# Patient Record
Sex: Female | Born: 1990 | Race: Black or African American | Hispanic: No | Marital: Single | Smoking: Current every day smoker
Health system: Southern US, Community
[De-identification: ages and names within clinical notes are randomized; demographics above are authoritative.]

## PROBLEM LIST (undated history)

## (undated) ENCOUNTER — Inpatient Hospital Stay (HOSPITAL_COMMUNITY): Payer: Self-pay

## (undated) DIAGNOSIS — D649 Anemia, unspecified: Secondary | ICD-10-CM

## (undated) DIAGNOSIS — N946 Dysmenorrhea, unspecified: Secondary | ICD-10-CM

## (undated) DIAGNOSIS — K852 Alcohol induced acute pancreatitis without necrosis or infection: Secondary | ICD-10-CM

## (undated) DIAGNOSIS — Z789 Other specified health status: Secondary | ICD-10-CM

## (undated) HISTORY — PX: NO PAST SURGERIES: SHX2092

---
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
## 2012-04-30 ENCOUNTER — Emergency Department (HOSPITAL_COMMUNITY): Payer: BC Managed Care – PPO

## 2012-04-30 ENCOUNTER — Encounter (HOSPITAL_COMMUNITY): Payer: Self-pay | Admitting: Emergency Medicine

## 2012-04-30 ENCOUNTER — Emergency Department (HOSPITAL_COMMUNITY)
Admission: EM | Admit: 2012-04-30 | Discharge: 2012-04-30 | Disposition: A | Discharge status: Home / Self Care | Payer: BC Managed Care – PPO | Attending: Emergency Medicine | Admitting: Emergency Medicine

## 2012-04-30 DIAGNOSIS — J069 Acute upper respiratory infection, unspecified: Secondary | ICD-10-CM | POA: Insufficient documentation

## 2012-04-30 MED ORDER — IBUPROFEN 800 MG PO TABS: 800.0000 mg | ORAL_TABLET | Freq: Three times a day (TID) | ORAL | Status: AC

## 2012-04-30 MED ORDER — HYDROCOD POLST-CHLORPHEN POLST 10-8 MG/5ML PO LQCR: 5.0000 mL | Freq: Two times a day (BID) | ORAL | Status: DC | PRN

## 2012-04-30 MED ORDER — ONDANSETRON 8 MG PO TBDP: 8.0000 mg | ORAL_TABLET | Freq: Three times a day (TID) | ORAL | Status: AC | PRN

## 2012-04-30 MED ORDER — ONDANSETRON 8 MG PO TBDP
8.0000 mg | ORAL_TABLET | Freq: Once | ORAL | Status: AC
  Administered 2012-04-30: 8 mg via ORAL
  Filled 2012-04-30: qty 1

## 2012-04-30 NOTE — ED Notes (Signed)
Pt presented to the ER with c/o URI, states her "nose is stuffed up and it is hard to breath" pt also states " I am afraid to go to sleep since I cannot breath"

## 2012-04-30 NOTE — ED Provider Notes (Signed)
History     CSN: [id number]  Arrival date & time 04/30/12  1622   First MD Initiated Contact with Patient 04/30/12 1646     5:03 PM HPI She reports yesterday began having a sore, cough, body aches, and nausea. Uncertain whether she is a fever but will get hot and cold very fast. Denies abdominal pain, vomiting, diarrhea, chest pain shortness of breath, or back pain   Patient is a 21 y.o. female presenting with URI. The history is provided by the patient.  URI The primary symptoms include fever, sore throat, cough, nausea and myalgias. Primary symptoms do not include headaches, ear pain, swollen glands, wheezing, abdominal pain, vomiting or rash. The current episode started yesterday. This is a new problem. The problem has been gradually worsening.  Symptoms associated with the illness include chills, congestion and rhinorrhea. The illness is not associated with sinus pressure.    History reviewed. No pertinent past medical history.  History reviewed. No pertinent past surgical history.  No family history on file.  History  Substance Use Topics  . Smoking status: Not on file  . Smokeless tobacco: Not on file  . Alcohol Use: Not on file    OB History    Grav Para Term Preterm Abortions TAB SAB Ect Mult Living                  Review of Systems  Constitutional: Positive for fever and chills.  HENT: Positive for congestion, sore throat and rhinorrhea. Negative for ear pain, facial swelling, neck pain, neck stiffness, postnasal drip and sinus pressure.   Respiratory: Positive for cough. Negative for shortness of breath and wheezing.   Cardiovascular: Negative for chest pain.  Gastrointestinal: Positive for nausea. Negative for vomiting, abdominal pain and diarrhea.  Genitourinary: Negative for dysuria, urgency, frequency, hematuria and flank pain.  Musculoskeletal: Positive for myalgias.  Skin: Negative for rash.  Neurological: Negative for headaches.  All other systems  reviewed and are negative.    Allergies  Review of patient's allergies indicates no known allergies.  Home Medications   Current Outpatient Rx  Name Route Sig Dispense Refill  . CETIRIZINE HCL 10 MG PO TABS Oral Take 10 mg by mouth daily.    . DAYQUIL PO Oral Take 1 capsule by mouth daily as needed. COLD AND COUGH      BP 105/75  Pulse 123  Temp(Src) 99.7 F (37.6 C) (Oral)  Resp 20  Wt 120 lb (54.432 kg)  SpO2 99%  Physical Exam  Vitals reviewed. Constitutional: She is oriented to person, place, and time. Vital signs are normal. She appears well-developed and well-nourished. No distress.  HENT:  Head: Normocephalic and atraumatic.  Right Ear: Hearing, tympanic membrane, external ear and ear canal normal.  Left Ear: Hearing, tympanic membrane, external ear and ear canal normal.  Nose: Nose normal. No mucosal edema or rhinorrhea. Right sinus exhibits no maxillary sinus tenderness and no frontal sinus tenderness. Left sinus exhibits no maxillary sinus tenderness and no frontal sinus tenderness.  Mouth/Throat: Oropharynx is clear and moist. No oropharyngeal exudate.  Eyes: Conjunctivae are normal. Pupils are equal, round, and reactive to light.  Neck: Trachea normal, normal range of motion and full passive range of motion without pain. Neck supple. No spinous process tenderness and no muscular tenderness present. No rigidity. No Brudzinski's sign and no Kernig's sign noted.  Cardiovascular: Normal rate, regular rhythm and normal heart sounds.  Exam reveals no friction rub.   No murmur heard.  Pulmonary/Chest: Effort normal. She has wheezes (RUL ). She has no rhonchi. She has no rales. She exhibits no tenderness.  Abdominal: Soft. Bowel sounds are normal. She exhibits no distension. There is no tenderness.  Musculoskeletal: Normal range of motion.  Lymphadenopathy:    She has no cervical adenopathy.  Neurological: She is alert and oriented to person, place, and time. Coordination  normal.  Skin: Skin is warm and dry. No rash noted. No erythema. No pallor.    ED Course  Procedures Dg Chest 2 View  04/30/2012  *RADIOLOGY REPORT*  Clinical Data: Chest pain and cough.  CHEST - 2 VIEW  Comparison: None.  Findings: Lungs are clear.  Heart size normal.  No pneumothorax or effusion.  IMPRESSION: Negative chest.  Original Report Authenticated By: Bernadene Bell. D'ALESSIO, M.D.      MDM    Patient likely has a viral infection. Will treat cough with antitussive and anti-inflammatory medication for sore throat. Also get medication for nausea. Advised ibuprofen and Tylenol for body aches and chills. Patient voices understanding and is ready for discharge      Thomasene Lot, Cordelia Poche 04/30/12 1743

## 2012-04-30 NOTE — ED Notes (Signed)
Pt. With sore throat, cough, body aches and chills.  Symptoms started yesterday morning.  Nausea but no vomiting or diarrhea.

## 2012-04-30 NOTE — Discharge Instructions (Signed)
Antibiotic Nonuse  Your caregiver felt that the infection or problem was not one that would be helped with an antibiotic. Infections may be caused by viruses or bacteria. Only a caregiver can tell which one of these is the likely cause of an illness. A cold is the most common cause of infection in both adults and children. A cold is a virus. Antibiotic treatment will have no effect on a viral infection. Viruses can lead to many lost days of work caring for sick children and many missed days of school. Children may catch as many as 10 "colds" or "flus" per year during which they can be tearful, cranky, and uncomfortable. The goal of treating a virus is aimed at keeping the ill person comfortable. Antibiotics are medications used to help the body fight bacterial infections. There are relatively few types of bacteria that cause infections but there are hundreds of viruses. While both viruses and bacteria cause infection they are very different types of germs. A viral infection will typically go away by itself within 7 to 10 days. Bacterial infections may spread or get worse without antibiotic treatment. Examples of bacterial infections are:  Sore throats (like strep throat or tonsillitis).   Infection in the lung (pneumonia).   Ear and skin infections.  Examples of viral infections are:  Colds or flus.   Most coughs and bronchitis.   Sore throats not caused by Strep.   Runny noses.  It is often best not to take an antibiotic when a viral infection is the cause of the problem. Antibiotics can kill off the helpful bacteria that we have inside our body and allow harmful bacteria to start growing. Antibiotics can cause side effects such as allergies, nausea, and diarrhea without helping to improve the symptoms of the viral infection. Additionally, repeated uses of antibiotics can cause bacteria inside of our body to become resistant. That resistance can be passed onto harmful bacterial. The next time  you have an infection it may be harder to treat if antibiotics are used when they are not needed. Not treating with antibiotics allows our own immune system to develop and take care of infections more efficiently. Also, antibiotics will work better for us when they are prescribed for bacterial infections. Treatments for a child that is ill may include:  Give extra fluids throughout the day to stay hydrated.   Get plenty of rest.   Only give your child over-the-counter or prescription medicines for pain, discomfort, or fever as directed by your caregiver.   The use of a cool mist humidifier may help stuffy noses.   Cold medications if suggested by your caregiver.  Your caregiver may decide to start you on an antibiotic if:  The problem you were seen for today continues for a longer length of time than expected.   You develop a secondary bacterial infection.  SEEK MEDICAL CARE IF:  Fever lasts longer than 5 days.   Symptoms continue to get worse after 5 to 7 days or become severe.   Difficulty in breathing develops.   Signs of dehydration develop (poor drinking, rare urinating, dark colored urine).   Changes in behavior or worsening tiredness (listlessness or lethargy).  Document Released: 01/27/2002 Document Revised: 11/07/2011 Document Reviewed: 07/26/2009 Exit[employer] Patient Information 2012 [employer].Upper Respiratory Infection, Adult An upper respiratory infection (URI) is also sometimes known as the common cold. The upper respiratory tract includes the nose, sinuses, throat, trachea, and bronchi. Bronchi are the airways leading to the lungs. Most   people improve within 1 week, but symptoms can last up to 2 weeks. A residual cough may last even longer.  CAUSES Many different viruses can infect the tissues lining the upper respiratory tract. The tissues become irritated and inflamed and often become very moist. Mucus production is also common. A cold is contagious. You can easily  spread the virus to others by oral contact. This includes kissing, sharing a glass, coughing, or sneezing. Touching your mouth or nose and then touching a surface, which is then touched by another person, can also spread the virus. SYMPTOMS  Symptoms typically develop 1 to 3 days after you come in contact with a cold virus. Symptoms vary from person to person. They may include:  Runny nose.   Sneezing.   Nasal congestion.   Sinus irritation.   Sore throat.   Loss of voice (laryngitis).   Cough.   Fatigue.   Muscle aches.   Loss of appetite.   Headache.   Low-grade fever.  DIAGNOSIS  You might diagnose your own cold based on familiar symptoms, since most people get a cold 2 to 3 times a year. Your caregiver can confirm this based on your exam. Most importantly, your caregiver can check that your symptoms are not due to another disease such as strep throat, sinusitis, pneumonia, asthma, or epiglottitis. Blood tests, throat tests, and X-rays are not necessary to diagnose a common cold, but they may sometimes be helpful in excluding other more serious diseases. Your caregiver will decide if any further tests are required. RISKS AND COMPLICATIONS  You may be at risk for a more severe case of the common cold if you smoke cigarettes, have chronic heart disease (such as heart failure) or lung disease (such as asthma), or if you have a weakened immune system. The very young and very old are also at risk for more serious infections. Bacterial sinusitis, middle ear infections, and bacterial pneumonia can complicate the common cold. The common cold can worsen asthma and chronic obstructive pulmonary disease (COPD). Sometimes, these complications can require emergency medical care and may be life-threatening. PREVENTION  The best way to protect against getting a cold is to practice good hygiene. Avoid oral or hand contact with people with cold symptoms. Wash your hands often if contact occurs.  There is no clear evidence that vitamin C, vitamin E, echinacea, or exercise reduces the chance of developing a cold. However, it is always recommended to get plenty of rest and practice good nutrition. TREATMENT  Treatment is directed at relieving symptoms. There is no cure. Antibiotics are not effective, because the infection is caused by a virus, not by bacteria. Treatment may include:  Increased fluid intake. Sports drinks offer valuable electrolytes, sugars, and fluids.   Breathing heated mist or steam (vaporizer or shower).   Eating chicken soup or other clear broths, and maintaining good nutrition.   Getting plenty of rest.   Using gargles or lozenges for comfort.   Controlling fevers with ibuprofen or acetaminophen as directed by your caregiver.   Increasing usage of your inhaler if you have asthma.  Zinc gel and zinc lozenges, taken in the first 24 hours of the common cold, can shorten the duration and lessen the severity of symptoms. Pain medicines may help with fever, muscle aches, and throat pain. A variety of non-prescription medicines are available to treat congestion and runny nose. Your caregiver can make recommendations and may suggest nasal or lung inhalers for other symptoms.  HOME CARE INSTRUCTIONS     Only take over-the-counter or prescription medicines for pain, discomfort, or fever as directed by your caregiver.   Use a warm mist humidifier or inhale steam from a shower to increase air moisture. This may keep secretions moist and make it easier to breathe.   Drink enough water and fluids to keep your urine clear or pale yellow.   Rest as needed.   Return to work when your temperature has returned to normal or as your caregiver advises. You may need to stay home longer to avoid infecting others. You can also use a face mask and careful hand washing to prevent spread of the virus.  SEEK MEDICAL CARE IF:   After the first few days, you feel you are getting worse  rather than better.   You need your caregiver's advice about medicines to control symptoms.   You develop chills, worsening shortness of breath, or brown or red sputum. These may be signs of pneumonia.   You develop yellow or brown nasal discharge or pain in the face, especially when you bend forward. These may be signs of sinusitis.   You develop a fever, swollen neck glands, pain with swallowing, or white areas in the back of your throat. These may be signs of strep throat.  SEEK IMMEDIATE MEDICAL CARE IF:   You have a fever.   You develop severe or persistent headache, ear pain, sinus pain, or chest pain.   You develop wheezing, a prolonged cough, cough up blood, or have a change in your usual mucus (if you have chronic lung disease).   You develop sore muscles or a stiff neck.  Document Released: 05/14/2001 Document Revised: 11/07/2011 Document Reviewed: 03/22/2011 Exit[employer] Patient Information 2012 [employer]. 

## 2012-05-02 NOTE — ED Provider Notes (Signed)
Medical screening examination/treatment/procedure(s) were performed by non-physician practitioner and as supervising physician I was immediately available for consultation/collaboration.  Raeford Razor, MD 05/02/12 1049

## 2012-12-07 ENCOUNTER — Encounter (HOSPITAL_COMMUNITY): Payer: Self-pay | Admitting: *Deleted

## 2012-12-07 ENCOUNTER — Emergency Department (HOSPITAL_COMMUNITY)
Admission: EM | Admit: 2012-12-07 | Discharge: 2012-12-08 | Disposition: A | Discharge status: Home / Self Care | Payer: BC Managed Care – PPO | Attending: Emergency Medicine | Admitting: Emergency Medicine

## 2012-12-07 DIAGNOSIS — Z3202 Encounter for pregnancy test, result negative: Secondary | ICD-10-CM | POA: Insufficient documentation

## 2012-12-07 DIAGNOSIS — F17200 Nicotine dependence, unspecified, uncomplicated: Secondary | ICD-10-CM | POA: Insufficient documentation

## 2012-12-07 DIAGNOSIS — R350 Frequency of micturition: Secondary | ICD-10-CM | POA: Insufficient documentation

## 2012-12-07 DIAGNOSIS — N946 Dysmenorrhea, unspecified: Secondary | ICD-10-CM

## 2012-12-07 LAB — CBC WITH DIFFERENTIAL/PLATELET
Basophils Absolute: 0 10*3/uL (ref 0.0–0.1)
Eosinophils Relative: 1 % (ref 0–5)
HCT: 39.9 % (ref 36.0–46.0)
Lymphocytes Relative: 25 % (ref 12–46)
Lymphs Abs: 1.8 10*3/uL (ref 0.7–4.0)
MCV: 88.3 fL (ref 78.0–100.0)
Monocytes Absolute: 0.4 10*3/uL (ref 0.1–1.0)
RDW: 12.5 % (ref 11.5–15.5)
WBC: 7.3 10*3/uL (ref 4.0–10.5)

## 2012-12-07 LAB — URINALYSIS, ROUTINE W REFLEX MICROSCOPIC
Bilirubin Urine: NEGATIVE
Ketones, ur: NEGATIVE mg/dL
Specific Gravity, Urine: 1.01 (ref 1.005–1.030)
Urobilinogen, UA: 0.2 mg/dL (ref 0.0–1.0)

## 2012-12-07 LAB — COMPREHENSIVE METABOLIC PANEL
CO2: 23 mEq/L (ref 19–32)
Calcium: 9.4 mg/dL (ref 8.4–10.5)
Creatinine, Ser: 0.83 mg/dL (ref 0.50–1.10)
GFR calc Af Amer: >90 mL/min (ref >90)
GFR calc non Af Amer: >90 mL/min (ref >90)
Glucose, Bld: 112 mg/dL — ABNORMAL HIGH (ref 70–99)

## 2012-12-07 LAB — URINE MICROSCOPIC-ADD ON

## 2012-12-07 NOTE — ED Notes (Signed)
Pt c/o lower abd pain since this morning; vomiting x 2 today; no diarrhea; period due 1/9 started today

## 2012-12-08 LAB — GC/CHLAMYDIA PROBE AMP: GC Probe RNA: NEGATIVE

## 2012-12-08 MED ORDER — IBUPROFEN 800 MG PO TABS: 800.0000 mg | ORAL_TABLET | Freq: Three times a day (TID) | ORAL | Status: DC

## 2012-12-08 MED ORDER — ONDANSETRON 8 MG PO TBDP
8.0000 mg | ORAL_TABLET | Freq: Once | ORAL | Status: AC
  Administered 2012-12-08: 8 mg via ORAL
  Filled 2012-12-08: qty 1

## 2012-12-08 MED ORDER — HYDROCODONE-ACETAMINOPHEN 5-325 MG PO TABS: 1.0000 | ORAL_TABLET | ORAL | Status: DC | PRN

## 2012-12-08 MED ORDER — OXYCODONE-ACETAMINOPHEN 5-325 MG PO TABS
1.0000 | ORAL_TABLET | Freq: Once | ORAL | Status: AC
  Administered 2012-12-08: 1 via ORAL
  Filled 2012-12-08: qty 1

## 2012-12-08 NOTE — ED Provider Notes (Signed)
Medical screening examination/treatment/procedure(s) were performed by non-physician practitioner and as supervising physician I was immediately available for consultation/collaboration.   Jennine Peddy L Nitza Schmid, MD 12/08/12 0526 

## 2012-12-08 NOTE — ED Provider Notes (Signed)
History     CSN: [id number]  Arrival date & time 12/07/12  2041   First MD Initiated Contact with Patient 12/08/12 0037      No chief complaint on file.   (Consider location/radiation/quality/duration/timing/severity/associated sxs/prior treatment) HPI History provided by pt.   Pt reports that she woke this morning w/ severe, non-radiating, crampy/sharp abdominal pain.  Developed period later in am.  Was not expecting for another 3 days.  Took an OTC period cramp medication w/out relief.  Denies fever, N/V/D an other vaginal sx.  Has had increased urinary frequency.   H/o dysmenorrhea and current sx similar but more severe.  History reviewed. No pertinent past medical history.  History reviewed. No pertinent past surgical history.  No family history on file.  History  Substance Use Topics  . Smoking status: Current Some Day Smoker -- 0.5 packs/day    Types: Cigarettes  . Smokeless tobacco: Not on file  . Alcohol Use: Yes    OB History    Grav Para Term Preterm Abortions TAB SAB Ect Mult Living                  Review of Systems  All other systems reviewed and are negative.    Allergies  Shellfish allergy  Home Medications   Current Outpatient Rx  Name  Route  Sig  Dispense  Refill  . IBUPROFEN 200 MG PO TABS   Oral   Take 200-400 mg by mouth every 6 (six) hours as needed.         Marland Kitchen HYDROCOD POLST-CPM POLST ER 10-8 MG/5ML PO LQCR   Oral   Take 5 mLs by mouth every 12 (twelve) hours as needed.   140 mL   0   . HYDROCODONE-ACETAMINOPHEN 5-325 MG PO TABS   Oral   Take 1 tablet by mouth every 4 (four) hours as needed for pain.   20 tablet   0   . IBUPROFEN 800 MG PO TABS   Oral   Take 1 tablet (800 mg total) by mouth 3 (three) times daily.   12 tablet   0     BP 130/77  Pulse 98  Temp 98.7 F (37.1 C)  Resp 20  SpO2 100%  LMP 12/07/2012  Physical Exam  Nursing note and vitals reviewed. Constitutional: She is oriented to person, place, and  time. She appears well-developed and well-nourished. No distress.  HENT:  Head: Normocephalic and atraumatic.  Eyes:       Normal appearance  Neck: Normal range of motion.  Cardiovascular: Normal rate and regular rhythm.   Pulmonary/Chest: Effort normal and breath sounds normal. No respiratory distress.  Abdominal: Soft. Bowel sounds are normal. She exhibits no distension and no mass. There is no rebound and no guarding.       Mid-line suprapubic ttp  Genitourinary:       No CVA tenderness.  Nml external genitalia.  Small amount of vaginal bleeding.  Cervix closed and appears nml.  No cervical motion tenderness or true adnexal tenderness but diffuse discomfort throughout exam.   Musculoskeletal: Normal range of motion.  Neurological: She is alert and oriented to person, place, and time.  Skin: Skin is warm and dry. No rash noted.  Psychiatric: She has a normal mood and affect. Her behavior is normal.    ED Course  Procedures (including critical care time)  Labs Reviewed  URINALYSIS, ROUTINE W REFLEX MICROSCOPIC - Abnormal; Notable for the following:    APPearance CLOUDY (*)  Hgb urine dipstick LARGE (*)     Leukocytes, UA TRACE (*)     All other components within normal limits  COMPREHENSIVE METABOLIC PANEL - Abnormal; Notable for the following:    Glucose, Bld 112 (*)     All other components within normal limits  WET PREP, GENITAL - Abnormal; Notable for the following:    Clue Cells Wet Prep HPF POC FEW (*)     WBC, Wet Prep HPF POC FEW (*)     All other components within normal limits  PREGNANCY, URINE  CBC WITH DIFFERENTIAL  URINE MICROSCOPIC-ADD ON  GC/CHLAMYDIA PROBE AMP   No results found.   1. Dysmenorrhea       MDM  Healthy 22yo F presents w/ severe suprapubic pain since waking this am.  Started her period today. H/o dysmenorrhea and this pain similar but more severe than usual.  On exam, uncomfortable appearing and tearful, suprapubic ttp, diffuse  tenderness on bimanual exam but otherwise nml genitalia.  Labs unremarkable.  All results discussed w/ pt.  No indication for imaging today.  Recommended f/u with gynecologist, particularly for persistent/recurring sx.  Prescribed 800mg  ibuprofen and vicodin for pain.  Return precautions discussed. She is aware that [facility] has an ED.  At time of discharge, pt appears much more comfortable and is moving around in bed w/ ease.         Otilio Miu, PA-C 12/08/12 0303  Otilio Miu, PA-C 12/08/12 [phone]

## 2013-09-15 ENCOUNTER — Encounter (HOSPITAL_COMMUNITY): Payer: Self-pay | Admitting: *Deleted

## 2013-09-15 ENCOUNTER — Inpatient Hospital Stay (HOSPITAL_COMMUNITY): Payer: BC Managed Care – PPO

## 2013-09-15 ENCOUNTER — Other Ambulatory Visit (HOSPITAL_COMMUNITY): Payer: BC Managed Care – PPO

## 2013-09-15 ENCOUNTER — Inpatient Hospital Stay (HOSPITAL_COMMUNITY)
Admission: AD | Admit: 2013-09-15 | Discharge: 2013-09-15 | Disposition: A | Discharge status: Home / Self Care | Payer: BC Managed Care – PPO | Source: Ambulatory Visit | Attending: Obstetrics & Gynecology | Admitting: Obstetrics & Gynecology

## 2013-09-15 DIAGNOSIS — R109 Unspecified abdominal pain: Secondary | ICD-10-CM | POA: Insufficient documentation

## 2013-09-15 DIAGNOSIS — O99891 Other specified diseases and conditions complicating pregnancy: Secondary | ICD-10-CM | POA: Insufficient documentation

## 2013-09-15 HISTORY — DX: Other specified health status: Z78.9

## 2013-09-15 LAB — URINALYSIS, ROUTINE W REFLEX MICROSCOPIC
Ketones, ur: NEGATIVE mg/dL
Leukocytes, UA: NEGATIVE
Nitrite: NEGATIVE
Specific Gravity, Urine: 1.02 (ref 1.005–1.030)
pH: 7.5 (ref 5.0–8.0)

## 2013-09-15 LAB — CBC WITH DIFFERENTIAL/PLATELET
Basophils Relative: 0 % (ref 0–1)
Hemoglobin: 11.9 g/dL — ABNORMAL LOW (ref 12.0–15.0)
MCHC: 33.2 g/dL (ref 30.0–36.0)
Monocytes Relative: 6 % (ref 3–12)
Neutro Abs: 4.7 10*3/uL (ref 1.7–7.7)
Neutrophils Relative %: 67 % (ref 43–77)
RBC: 4.06 MIL/uL (ref 3.87–5.11)

## 2013-09-15 LAB — WET PREP, GENITAL: Trich, Wet Prep: NONE SEEN

## 2013-09-15 LAB — ABO/RH: ABO/RH(D): A POS

## 2013-09-15 LAB — POCT PREGNANCY, URINE: Preg Test, Ur: POSITIVE — AB

## 2013-09-15 NOTE — MAU Note (Signed)
Pt states she has an appointment and she needs to leave. Pt left AMA

## 2013-09-15 NOTE — MAU Provider Note (Signed)
History     CSN: [id number]  Arrival date and time: 09/15/13 1511   None     Chief Complaint  Patient presents with  . Abdominal Pain   HPI Audrey Schultz is a 22 y.o female who presents for sharp abdominal pain that varies in intensity for the past two days. The pain is suprapubic. She described the pain as pressure. She denies vaginal bleeding or discharge. She denies dysuria but complains of frequency and urgency. She has been with her boyfriend for 3 years and they use condoms sometimes. She denies any new sexual contacts.  OB History   Grav Para Term Preterm Abortions TAB SAB Ect Mult Living   2               Past Medical History  Diagnosis Date  . Medical history non-contributory     Past Surgical History  Procedure Laterality Date  . No past surgeries      Family History  Problem Relation Age of Onset  . Hypertension Mother   . Diabetes Mother     History  Substance Use Topics  . Smoking status: Current Some Day Smoker -- 0.50 packs/day    Types: Cigarettes  . Smokeless tobacco: Not on file  . Alcohol Use: Yes    Allergies:  Allergies  Allergen Reactions  . Shellfish Allergy     Prescriptions prior to admission  Medication Sig Dispense Refill  . chlorpheniramine-HYDROcodone (TUSSIONEX PENNKINETIC ER) 10-8 MG/5ML LQCR Take 5 mLs by mouth every 12 (twelve) hours as needed.  140 mL  0  . HYDROcodone-acetaminophen (NORCO/VICODIN) 5-325 MG per tablet Take 1 tablet by mouth every 4 (four) hours as needed for pain.  20 tablet  0  . ibuprofen (ADVIL,MOTRIN) 200 MG tablet Take 200-400 mg by mouth every 6 (six) hours as needed.      Marland Kitchen ibuprofen (ADVIL,MOTRIN) 800 MG tablet Take 1 tablet (800 mg total) by mouth 3 (three) times daily.  12 tablet  0    Review of Systems  Constitutional: Negative for fever.  HENT: Negative for sore throat.   Respiratory: Negative for cough, shortness of breath and wheezing.   Cardiovascular: Negative for chest pain and  palpitations.  Gastrointestinal: Positive for abdominal pain. Negative for heartburn, nausea, vomiting, diarrhea and constipation.  Genitourinary: Positive for urgency and frequency. Negative for dysuria.  Musculoskeletal: Negative for myalgias.  Skin: Negative.  Negative for itching and rash.  Neurological: Negative for weakness and headaches.   Physical Exam   Last menstrual period 08/15/2013.  Physical Exam  Constitutional: She is oriented to person, place, and time. She appears well-developed and well-nourished. No distress.  HENT:  Head: Normocephalic.  Cardiovascular: Normal rate, regular rhythm, normal heart sounds and intact distal pulses.   Respiratory: Effort normal and breath sounds normal.  GI: Soft. Bowel sounds are normal. She exhibits no distension.  Genitourinary: Vagina normal and uterus normal. There is no rash or lesion on the right labia. There is no rash or lesion on the left labia. No bleeding around the vagina. No vaginal discharge found.  Neurological: She is alert and oriented to person, place, and time.  Skin: Skin is warm and dry. No rash noted. She is not diaphoretic. No erythema. No pallor.  Psychiatric: She has a normal mood and affect.   Results for orders placed during the hospital encounter of 09/15/13 (from the past 24 hour(s))  URINALYSIS, ROUTINE W REFLEX MICROSCOPIC     Status: Abnormal  Collection Time    09/15/13  3:20 PM      Result Value Range   Color, Urine YELLOW  YELLOW   APPearance CLOUDY (*) CLEAR   Specific Gravity, Urine 1.020  1.005 - 1.030   pH 7.5  5.0 - 8.0   Glucose, UA NEGATIVE  NEGATIVE mg/dL   Hgb urine dipstick NEGATIVE  NEGATIVE   Bilirubin Urine NEGATIVE  NEGATIVE   Ketones, ur NEGATIVE  NEGATIVE mg/dL   Protein, ur NEGATIVE  NEGATIVE mg/dL   Urobilinogen, UA 0.2  0.0 - 1.0 mg/dL   Nitrite NEGATIVE  NEGATIVE   Leukocytes, UA NEGATIVE  NEGATIVE  POCT PREGNANCY, URINE     Status: Abnormal   Collection Time    09/15/13   3:29 PM      Result Value Range   Preg Test, Ur POSITIVE (*) NEGATIVE  CBC WITH DIFFERENTIAL     Status: Abnormal   Collection Time    09/15/13  3:50 PM      Result Value Range   WBC 7.1  4.0 - 10.5 K/uL   RBC 4.06  3.87 - 5.11 MIL/uL   Hemoglobin 11.9 (*) 12.0 - 15.0 g/dL   HCT 16.1 (*) 09.6 - 04.5 %   MCV 88.2  78.0 - 100.0 fL   MCH 29.3  26.0 - 34.0 pg   MCHC 33.2  30.0 - 36.0 g/dL   RDW 40.9  81.1 - 91.4 %   Platelets 289  150 - 400 K/uL   Neutrophils Relative % 67  43 - 77 %   Neutro Abs 4.7  1.7 - 7.7 K/uL   Lymphocytes Relative 27  12 - 46 %   Lymphs Abs 1.9  0.7 - 4.0 K/uL   Monocytes Relative 6  3 - 12 %   Monocytes Absolute 0.4  0.1 - 1.0 K/uL   Eosinophils Relative 0  0 - 5 %   Eosinophils Absolute 0.0  0.0 - 0.7 K/uL   Basophils Relative 0  0 - 1 %   Basophils Absolute 0.0  0.0 - 0.1 K/uL  ABO/RH     Status: None   Collection Time    09/15/13  3:50 PM      Result Value Range   ABO/RH(D) A POS    HCG, QUANTITATIVE, PREGNANCY     Status: Abnormal   Collection Time    09/15/13  3:50 PM      Result Value Range   hCG, Beta Chain, Quant, S 1325 (*) <5 mIU/mL  WET PREP, GENITAL     Status: Abnormal   Collection Time    09/15/13  4:25 PM      Result Value Range   Yeast Wet Prep HPF POC NONE SEEN  NONE SEEN   Trich, Wet Prep NONE SEEN  NONE SEEN   Clue Cells Wet Prep HPF POC FEW (*) NONE SEEN   WBC, Wet Prep HPF POC NONE SEEN  NONE SEEN   MAU Course  Procedures  Assessment and Plan   Patient was informed that she is pregnant. Quantitative hCG will be drawn along with CBC. GC/Chlamydia and wet prep collected. [place] to confirm uterine pregnancy.  Patient wants to leave AMA and not wait on [place]. She was explained the risks and AMA form was signed. Patient was instructed to come back to MAU if the pain gets worse or she has vaginal bleeding. Patient was told that she needs a [place] to r/o ectopic pregnancy, which can be life-threatening.  She was told that she could come  back anytime to the MAU to get the [place].  Referral sent to  [facility] for prenatal care.  Bing Plume 09/15/2013, 3:41 PM  I reviewed the student's note and agree with him assessment and plan of care.  I went in to talk to the patient about the concern of pain or abnormal bleeding in early pregnancy and that the evaluation of her sxs was not complete without the ultrasound.  She states she cannot stay bc she has appt at [facility] and needs verification letter.  Letter was given based on LMP and offer for her to come back for completion of her evaluation of abdominal pain in early pregnancy.  Discussed ectopic precautions.

## 2013-09-15 NOTE — MAU Note (Signed)
Pt states she has had a pain that comes and goes for about 2 days. Pt states pain is not steady.

## 2013-09-16 ENCOUNTER — Emergency Department (HOSPITAL_COMMUNITY)
Admission: EM | Admit: 2013-09-16 | Discharge: 2013-09-16 | Disposition: A | Discharge status: Home / Self Care | Payer: BC Managed Care – PPO | Attending: Emergency Medicine | Admitting: Emergency Medicine

## 2013-09-16 ENCOUNTER — Emergency Department (HOSPITAL_COMMUNITY): Payer: BC Managed Care – PPO

## 2013-09-16 ENCOUNTER — Encounter (HOSPITAL_COMMUNITY): Payer: Self-pay | Admitting: Emergency Medicine

## 2013-09-16 DIAGNOSIS — W540XXA Bitten by dog, initial encounter: Secondary | ICD-10-CM | POA: Insufficient documentation

## 2013-09-16 DIAGNOSIS — IMO0002 Reserved for concepts with insufficient information to code with codable children: Secondary | ICD-10-CM | POA: Insufficient documentation

## 2013-09-16 DIAGNOSIS — S71109A Unspecified open wound, unspecified thigh, initial encounter: Secondary | ICD-10-CM | POA: Insufficient documentation

## 2013-09-16 DIAGNOSIS — S8990XA Unspecified injury of unspecified lower leg, initial encounter: Secondary | ICD-10-CM | POA: Insufficient documentation

## 2013-09-16 DIAGNOSIS — S71009A Unspecified open wound, unspecified hip, initial encounter: Secondary | ICD-10-CM | POA: Insufficient documentation

## 2013-09-16 DIAGNOSIS — R296 Repeated falls: Secondary | ICD-10-CM | POA: Insufficient documentation

## 2013-09-16 DIAGNOSIS — Z3490 Encounter for supervision of normal pregnancy, unspecified, unspecified trimester: Secondary | ICD-10-CM

## 2013-09-16 DIAGNOSIS — Y9301 Activity, walking, marching and hiking: Secondary | ICD-10-CM | POA: Insufficient documentation

## 2013-09-16 DIAGNOSIS — Y929 Unspecified place or not applicable: Secondary | ICD-10-CM | POA: Insufficient documentation

## 2013-09-16 DIAGNOSIS — T148XXA Other injury of unspecified body region, initial encounter: Secondary | ICD-10-CM

## 2013-09-16 DIAGNOSIS — S63509A Unspecified sprain of unspecified wrist, initial encounter: Secondary | ICD-10-CM | POA: Insufficient documentation

## 2013-09-16 DIAGNOSIS — O9989 Other specified diseases and conditions complicating pregnancy, childbirth and the puerperium: Secondary | ICD-10-CM | POA: Insufficient documentation

## 2013-09-16 LAB — GC/CHLAMYDIA PROBE AMP
CT Probe RNA: NEGATIVE
GC Probe RNA: NEGATIVE

## 2013-09-16 MED ORDER — AMOXICILLIN-POT CLAVULANATE 875-125 MG PO TABS
1.0000 | ORAL_TABLET | Freq: Once | ORAL | Status: AC
  Administered 2013-09-16: 1 via ORAL
  Filled 2013-09-16: qty 1

## 2013-09-16 MED ORDER — ACETAMINOPHEN 325 MG PO TABS
650.0000 mg | ORAL_TABLET | Freq: Once | ORAL | Status: AC
  Administered 2013-09-16: 650 mg via ORAL
  Filled 2013-09-16: qty 2

## 2013-09-16 MED ORDER — AMOXICILLIN-POT CLAVULANATE 500-125 MG PO TABS: 1.0000 | ORAL_TABLET | Freq: Three times a day (TID) | ORAL | Status: DC

## 2013-09-16 NOTE — ED Notes (Signed)
Pt has small puncture wound to top of R thigh. No bleeding. Pt states that her mother may know the owner of the dog and is trying to find out if the dog is up to date on it's rabies status.

## 2013-09-16 NOTE — ED Notes (Signed)
Pt reports a neighborhood pitbull bit her right thigh, unsure who the owner is. States after the dog bite she fell down, landing on left side. C/o pain to left wrist. minor puncture wound noted to right thigh, bleeding controlled. Pt has guaze covering wound.

## 2013-09-16 NOTE — ED Provider Notes (Signed)
CSN: [id number]     Arrival date & time 09/16/13  1948 History  This chart was scribed for non-physician practitioner Marlon Pel, PA-C, working with Flint Melter, MD by Ronal Fear, ED scribe. This patient was seen in room WTR7/WTR7 and the patient's care was started at 8:40 PM.    Chief Complaint  Patient presents with  . Animal Bite  . Wrist Pain   Patient is a 22 y.o. female presenting with animal bite. The history is provided by the patient. No language interpreter was used.  Animal Bite Contact animal:  Dog Location:  Leg Leg injury location:  R hip Pain details:    Quality:  Unable to specify   Severity:  No pain   Timing:  Rare   Progression:  Unchanged Incident location:  Outside Animal's rabies vaccination status:  Up to date Animal in possession: yes   Relieved by:  None tried Worsened by:  Nothing tried Ineffective treatments:  None tried Associated symptoms: no fever, no numbness and no swelling     Pt was bitten on her right upper thigh by a neighborhood dog that jumped on her as she was walking by today she fell onto her left wrist during the incident. Pt states that her wrist just generally hurts. She also complains of right ankle pain that hurts when she moves the ankle.  She is ambulatory on presentation and she does not seem to be in any acute distress. The neighbors have the animal contained in the house and the mom says they confirmed the dog is up to date on its shots, including rabies vaccinations.  Past Medical History  Diagnosis Date  . Medical history non-contributory    Past Surgical History  Procedure Laterality Date  . No past surgeries     Family History  Problem Relation Age of Onset  . Hypertension Mother   . Diabetes Mother    History  Substance Use Topics  . Smoking status: Current Some Day Smoker -- 0.50 packs/day    Types: Cigarettes  . Smokeless tobacco: Not on file  . Alcohol Use: Yes   OB History   Grav Para Term Preterm  Abortions TAB SAB Ect Mult Living   2              Review of Systems  Constitutional: Negative for fever.  Skin: Positive for wound.  Neurological: Negative for numbness.  All other systems reviewed and are negative.    Allergies  Shellfish allergy  Home Medications   Current Outpatient Rx  Name  Route  Sig  Dispense  Refill  . ibuprofen (ADVIL,MOTRIN) 200 MG tablet   Oral   Take 400 mg by mouth every 6 (six) hours as needed for pain.         Marland Kitchen amoxicillin-clavulanate (AUGMENTIN) 500-125 MG per tablet   Oral   Take 1 tablet (500 mg total) by mouth every 8 (eight) hours.   21 tablet   0    BP 134/69  Pulse 107  Temp(Src) 98.5 F (36.9 C) (Oral)  Resp 20  SpO2 100%  LMP 08/15/2013  Breastfeeding? Unknown Physical Exam  Nursing note and vitals reviewed. Constitutional: She is oriented to person, place, and time. She appears well-developed and well-nourished. No distress.  HENT:  Head: Normocephalic and atraumatic.  Eyes: EOM are normal.  Neck: Neck supple. No tracheal deviation present.  Cardiovascular: Normal rate.   Pulmonary/Chest: Effort normal. No respiratory distress.  Musculoskeletal:  Left wrist: She exhibits tenderness. She exhibits normal range of motion, no bony tenderness, no swelling, no effusion, no crepitus, no deformity and no laceration.       Legs: Neurological: She is alert and oriented to person, place, and time.  Skin: Skin is warm and dry.  Psychiatric: She has a normal mood and affect. Her behavior is normal.    ED Course  Procedures (including critical care time) DIAGNOSTIC STUDIES: Oxygen Saturation is 100% on RA, normal by my interpretation.    COORDINATION OF CARE:  8:43 PM- Pt advised of plan for treatment including antibiotics for the dog bite and a wrist splint for 1x week, if the pain has not resolved she is to follow up and pt agrees.   Labs Review Labs Reviewed - No data to display Imaging Review No results  found.  EKG Interpretation   None       MDM   1. Animal bite   2. Pregnant   3. Wrist sprain and strain, left, initial encounter    Risk vs benefit of xrays considered due to patient being pregnant and low suspicion of fracture will not do xrays. Wrist splint left wrist given. Wound care of dog bite. Pt started on Augmentin. Tylenol for pain. F/u to dr. Concepcion Elk PCP.  22 y.o.Zachery Conch Jurczyk's evaluation in the Emergency Department is complete. It has been determined that no acute conditions requiring further emergency intervention are present at this time. The patient/guardian have been advised of the diagnosis and plan. We have discussed signs and symptoms that warrant return to the ED, such as changes or worsening in symptoms.  Vital signs are stable at discharge. Filed Vitals:   09/16/13 1956  BP: 134/69  Pulse: 107  Temp: 98.5 F (36.9 C)  Resp: 20    Patient/guardian has voiced understanding and agreed to follow-up with the PCP or specialist.  I personally performed the services described in this documentation, which was scribed in my presence. The recorded information has been reviewed and is accurate.   Dorthula Matas, PA-C 09/16/13 2115

## 2013-09-16 NOTE — ED Provider Notes (Signed)
Medical screening examination/treatment/procedure(s) were performed by non-physician practitioner and as supervising physician I was immediately available for consultation/collaboration.  Flint Melter, MD 09/16/13 2151

## 2013-09-16 NOTE — ED Notes (Signed)
Pt ambulatory to exam room with steady gait.  

## 2013-09-18 ENCOUNTER — Inpatient Hospital Stay (HOSPITAL_COMMUNITY): Payer: BC Managed Care – PPO

## 2013-09-18 ENCOUNTER — Encounter (HOSPITAL_COMMUNITY): Payer: Self-pay

## 2013-09-18 ENCOUNTER — Inpatient Hospital Stay (HOSPITAL_COMMUNITY)
Admission: AD | Admit: 2013-09-18 | Discharge: 2013-09-18 | Disposition: A | Discharge status: Home / Self Care | Payer: BC Managed Care – PPO | Source: Ambulatory Visit | Attending: Family Medicine | Admitting: Family Medicine

## 2013-09-18 DIAGNOSIS — N949 Unspecified condition associated with female genital organs and menstrual cycle: Secondary | ICD-10-CM | POA: Insufficient documentation

## 2013-09-18 DIAGNOSIS — O99891 Other specified diseases and conditions complicating pregnancy: Secondary | ICD-10-CM | POA: Insufficient documentation

## 2013-09-18 DIAGNOSIS — O34599 Maternal care for other abnormalities of gravid uterus, unspecified trimester: Secondary | ICD-10-CM | POA: Insufficient documentation

## 2013-09-18 DIAGNOSIS — R109 Unspecified abdominal pain: Secondary | ICD-10-CM | POA: Insufficient documentation

## 2013-09-18 DIAGNOSIS — Z3201 Encounter for pregnancy test, result positive: Secondary | ICD-10-CM

## 2013-09-18 DIAGNOSIS — Z3490 Encounter for supervision of normal pregnancy, unspecified, unspecified trimester: Secondary | ICD-10-CM

## 2013-09-18 DIAGNOSIS — N831 Corpus luteum cyst of ovary, unspecified side: Secondary | ICD-10-CM | POA: Insufficient documentation

## 2013-09-18 LAB — URINALYSIS, ROUTINE W REFLEX MICROSCOPIC
Hgb urine dipstick: NEGATIVE
Leukocytes, UA: NEGATIVE
Nitrite: NEGATIVE
Protein, ur: NEGATIVE mg/dL
Specific Gravity, Urine: <1.005 — ABNORMAL LOW (ref 1.005–1.030)
Urobilinogen, UA: 0.2 mg/dL (ref 0.0–1.0)
pH: 7 (ref 5.0–8.0)

## 2013-09-18 LAB — HCG, QUANTITATIVE, PREGNANCY: hCG, Beta Chain, Quant, S: 5373 m[IU]/mL — ABNORMAL HIGH (ref <5)

## 2013-09-18 NOTE — MAU Provider Note (Signed)
History     CSN: [id number]  Arrival date and time: 09/18/13 4098   First Provider Initiated Contact with Patient 09/18/13 2030      Chief Complaint  Patient presents with  . patient states that she wants an ultrasound    HPI This is a 22 y.o. female at [redacted]w[redacted]d who was seen here for early pregnancy abdominal pain on 10/15, but left AMA prior to having US done.  States pain went away the next day. No bleeding. Had been seen also for a dog bite and sprained wrist. She is wearing a splint but is pushing herself up on the bed easily with no pain. Observed several times putting weight on both hands onto the bed.  RN Note: Patient states she is in to get an ultrasound and check about her baby. She states that a dog bite on her right upper anterior thigh and she fell on Thursday. She states that she evaluated at [facility] ed and received abx. She denies any pain, vaginal bleeding or discomfort today. Patient left AMA on 09/15/13.      OB History   Grav Para Term Preterm Abortions TAB SAB Ect Mult Living   1               Past Medical History  Diagnosis Date  . Medical history non-contributory     Past Surgical History  Procedure Laterality Date  . No past surgeries      Family History  Problem Relation Age of Onset  . Hypertension Mother   . Diabetes Mother     History  Substance Use Topics  . Smoking status: Former Smoker -- 0.50 packs/day    Types: Cigarettes    Quit date: 09/01/2013  . Smokeless tobacco: Not on file  . Alcohol Use: No    Allergies:  Allergies  Allergen Reactions  . Shellfish Allergy Anaphylaxis    Prescriptions prior to admission  Medication Sig Dispense Refill  . amoxicillin-clavulanate (AUGMENTIN) 500-125 MG per tablet Take 1 tablet (500 mg total) by mouth every 8 (eight) hours.  21 tablet  0  . ibuprofen (ADVIL,MOTRIN) 200 MG tablet Take 400 mg by mouth every 6 (six) hours as needed for pain.        Review of Systems  Constitutional: Negative  for fever and chills.  Gastrointestinal: Negative for nausea, vomiting, abdominal pain, diarrhea and constipation.  Genitourinary: Negative for dysuria.       No bleeding   Neurological: Negative for weakness.   Physical Exam   Blood pressure 114/65, pulse 103, temperature 99.2 F (37.3 C), temperature source Oral, resp. rate 16, height 5\' 4"  (1.626 m), weight 58.571 kg (129 lb 2 oz), last menstrual period 08/15/2013, SpO2 100.00%.  Physical Exam  Constitutional: She is oriented to person, place, and time. She appears well-developed and well-nourished. No distress.  Cardiovascular: Normal rate.   Respiratory: Effort normal.  GI: Soft. There is no tenderness.  Genitourinary:  Pelvic exam not indicated   Musculoskeletal: Normal range of motion.  Neurological: She is alert and oriented to person, place, and time.  Skin: Skin is warm and dry.  Psychiatric: She has a normal mood and affect.    MAU Course  Procedures  MDM Will do repeat Quant HCG and [place] tonight.  Results for orders placed during the hospital encounter of 09/18/13 (from the past 24 hour(s))  URINALYSIS, ROUTINE W REFLEX MICROSCOPIC     Status: Abnormal   Collection Time    09/18/13  7:55 PM      Result Value Range   Color, Urine YELLOW  YELLOW   APPearance CLEAR  CLEAR   Specific Gravity, Urine <1.005 (*) 1.005 - 1.030   pH 7.0  5.0 - 8.0   Glucose, UA NEGATIVE  NEGATIVE mg/dL   Hgb urine dipstick NEGATIVE  NEGATIVE   Bilirubin Urine NEGATIVE  NEGATIVE   Ketones, ur NEGATIVE  NEGATIVE mg/dL   Protein, ur NEGATIVE  NEGATIVE mg/dL   Urobilinogen, UA 0.2  0.0 - 1.0 mg/dL   Nitrite NEGATIVE  NEGATIVE   Leukocytes, UA NEGATIVE  NEGATIVE  HCG, QUANTITATIVE, PREGNANCY     Status: Abnormal   Collection Time    09/18/13  8:58 PM      Result Value Range   hCG, Beta Chain, Quant, S 5373 (*) <5 mIU/mL   Results for JOSI, ROEDIGER (MRN [id number]) as of 09/18/2013 23:16  Ref. Range 09/15/2013 15:50 09/15/2013  16:25 09/18/2013 19:55 09/18/2013 20:58  hCG, Beta Chain, Quant, S Latest Range: <5 mIU/mL 1325 (H)   5373 (H)   US Ob Transvaginal  09/18/2013   CLINICAL DATA:  Pelvic pain, rule out ectopic pregnancy. HCG equals 5373. Estimated gestational age by last menstrual period equals 4 weeks 6 days.  EXAM: OBSTETRIC <14 WK ULTRASOUND  TECHNIQUE: Transabdominal ultrasound was performed for evaluation of the gestation as well as the maternal uterus and adnexal regions.  COMPARISON:  None.  FINDINGS: Intrauterine gestational sac: Single  Yolk sac:  Present  Embryo:  Not identified  MSD: 8.3  mm   5 w   3 d  [place] EDC: 05/18/2014  Maternal uterus/adnexae: Normal ovaries. Corpus luteal cyst in the right ovary. Small amount free fluid.    IMPRESSION: 1. Single intrauterine gestational sac with yolk sac. Estimated gestational age by mean sac diameter equals 5 weeks 3 days.  2. Normal ovaries.  3. Small amount of free fluid.     Electronically Signed   By: Genevive Bi M.D.   On: 09/18/2013 22:33   Assessment and Plan  A:  SIUP at 5.3 weeks      Yolk sac seen, rules out ectopic  P:  Discharge home       Has appointment in clinic per last visit         [facility] 09/18/2013, 8:37 PM

## 2013-09-18 NOTE — MAU Note (Signed)
Patient states she is in to get an ultrasound and check about her baby. She states that a dog bite on her right upper anterior thigh and she fell on Thursday. She states that she evaluated at [facility] ed and received abx. She denies any pain, vaginal bleeding or discomfort today. Patient left AMA on 09/15/13.

## 2013-09-19 NOTE — MAU Provider Note (Signed)
Chart reviewed and agree with management and plan.  

## 2013-10-20 ENCOUNTER — Encounter: Payer: Self-pay | Admitting: Advanced Practice Midwife

## 2013-10-20 ENCOUNTER — Ambulatory Visit (INDEPENDENT_AMBULATORY_CARE_PROVIDER_SITE_OTHER): Payer: BC Managed Care – PPO | Admitting: Advanced Practice Midwife

## 2013-10-20 VITALS — BP 114/62 | Wt 128.5 lb

## 2013-10-20 DIAGNOSIS — Z3490 Encounter for supervision of normal pregnancy, unspecified, unspecified trimester: Secondary | ICD-10-CM

## 2013-10-20 DIAGNOSIS — F1920 Other psychoactive substance dependence, uncomplicated: Secondary | ICD-10-CM

## 2013-10-20 DIAGNOSIS — Z3491 Encounter for supervision of normal pregnancy, unspecified, first trimester: Secondary | ICD-10-CM

## 2013-10-20 LAB — POCT URINALYSIS DIP (DEVICE)
Bilirubin Urine: NEGATIVE
Hgb urine dipstick: NEGATIVE
Leukocytes, UA: NEGATIVE
Protein, ur: NEGATIVE mg/dL
pH: 6 (ref 5.0–8.0)

## 2013-10-20 NOTE — Progress Notes (Signed)
Pulse: 98 Early 1 hr due to her mother being diabetic.

## 2013-10-20 NOTE — Progress Notes (Signed)
New OB visit. See other note   Subjective:    Audrey Schultz is a G1P0 [redacted]w[redacted]d being seen today for her first obstetrical visit.  Her obstetrical history is significant for primigravida. Patient does intend to breast feed. Pregnancy history fully reviewed.  Patient reports no complaints.  Filed Vitals:   10/20/13 1351  BP: 114/62  Weight: 58.287 kg (128 lb 8 oz)    HISTORY: OB History  Gravida Para Term Preterm AB SAB TAB Ectopic Multiple Living  1             # Outcome Date GA Lbr Len/2nd Weight Sex Delivery Anes PTL Lv  1 CUR              Past Medical History  Diagnosis Date  . Medical history non-contributory    Past Surgical History  Procedure Laterality Date  . No past surgeries     Family History  Problem Relation Age of Onset  . Hypertension Mother   . Diabetes Mother      Exam    Uterus:  Fundal Height: 10 cm  Pelvic Exam:    Perineum: No Hemorrhoids   Vulva: Bartholin's, Urethra, Skene's normal   Vagina:  normal mucosa, normal discharge   pH:    Cervix: nulliparous appearance   Adnexa: no mass, fullness, tenderness   Bony Pelvis: gynecoid  System: Breast:  normal appearance, no masses or tenderness   Skin: normal coloration and turgor, no rashes    Neurologic: oriented, grossly non-focal   Extremities: normal strength, tone, and muscle mass   HEENT neck supple with midline trachea   Mouth/Teeth mucous membranes moist, pharynx normal without lesions   Neck supple   Cardiovascular: regular rate and rhythm   Respiratory:  appears well, vitals normal, no respiratory distress, acyanotic, normal RR, ear and throat exam is normal   Abdomen: soft, non-tender; bowel sounds normal; no masses,  no organomegaly   Urinary: urethral meatus normal      Assessment:    Pregnancy: G1P0 Patient Active Problem List   Diagnosis Date Noted  . Normal intrauterine pregnancy on prenatal ultrasound 10/21/2013        Plan:     Initial labs  drawn. Prenatal vitamins. Problem list reviewed and updated. Genetic Screening discussed First Screen: declined  Ultrasound discussed; fetal survey: Reviewed first [place]. Anatomy US to be done at 20 wks  Follow up in 4 weeks. 50% of 30 min visit spent on counseling and coordination of care.    Excited to hear fetal heart tones!  [facility] 10/21/2013

## 2013-10-21 ENCOUNTER — Encounter: Payer: Self-pay | Admitting: Advanced Practice Midwife

## 2013-10-21 DIAGNOSIS — Z3490 Encounter for supervision of normal pregnancy, unspecified, unspecified trimester: Secondary | ICD-10-CM | POA: Insufficient documentation

## 2013-10-21 LAB — OBSTETRIC PANEL
Antibody Screen: NEGATIVE
Basophils Absolute: 0 10*3/uL (ref 0.0–0.1)
Basophils Relative: 0 % (ref 0–1)
HCT: 32.1 % — ABNORMAL LOW (ref 36.0–46.0)
Lymphocytes Relative: 26 % (ref 12–46)
MCHC: 32.7 g/dL (ref 30.0–36.0)
Monocytes Relative: 5 % (ref 3–12)
Neutro Abs: 5.5 10*3/uL (ref 1.7–7.7)
Platelets: 301 10*3/uL (ref 150–400)
RBC: 3.64 MIL/uL — ABNORMAL LOW (ref 3.87–5.11)
RDW: 13.2 % (ref 11.5–15.5)
Rubella: 10.6 Index — ABNORMAL HIGH (ref <0.90)
WBC: 8.1 10*3/uL (ref 4.0–10.5)

## 2013-10-21 LAB — HIV ANTIBODY (ROUTINE TESTING W REFLEX): HIV: NONREACTIVE

## 2013-10-21 LAB — ALCOHOL METABOLITE (ETG), URINE: Ethyl Glucuronide (EtG): NEGATIVE ng/mL

## 2013-10-21 NOTE — Patient Instructions (Signed)
Pregnancy - First Trimester  During sexual intercourse, millions of sperm go into the vagina. Only 1 sperm will penetrate and fertilize the female egg while it is in the Fallopian tube. One week later, the fertilized egg implants into the wall of the uterus. An embryo begins to develop into a baby. At 6 to 8 weeks, the eyes and face are formed and the heartbeat can be seen on ultrasound. At the end of 12 weeks (first trimester), all the baby's organs are formed. Now that you are pregnant, you will want to do everything you can to have a healthy baby. Two of the most important things are to get good prenatal care and follow your caregiver's instructions. Prenatal care is all the medical care you receive before the baby's birth. It is given to prevent, find, and treat problems during the pregnancy and childbirth.  PRENATAL EXAMS  · During prenatal visits, your weight, blood pressure, and urine are checked. This is done to make sure you are healthy and progressing normally during the pregnancy.  · A pregnant woman should gain 25 to 35 pounds during the pregnancy. However, if you are overweight or underweight, your caregiver will advise you regarding your weight.  · Your caregiver will ask and answer questions for you.  · Blood work, cervical cultures, other necessary tests, and a Pap test are done during your prenatal exams. These tests are done to check on your health and the probable health of your baby. Tests are strongly recommended and done for HIV with your permission. This is the virus that causes AIDS. These tests are done because medicines can be given to help prevent your baby from being born with this infection should you have been infected without knowing it. Blood work is also used to find out your blood type, previous infections, and follow your blood levels (hemoglobin).  · Low hemoglobin (anemia) is common during pregnancy. Iron and vitamins are given to help prevent this. Later in the pregnancy, blood  tests for diabetes will be done along with any other tests if any problems develop.  · You may need other tests to make sure you and the baby are doing well.  CHANGES DURING THE FIRST TRIMESTER   Your body goes through many changes during pregnancy. They vary from person to person. Talk to your caregiver about changes you notice and are concerned about. Changes can include:  · Your menstrual period stops.  · The egg and sperm carry the genes that determine what you look like. Genes from you and your partner are forming a baby. The female genes determine whether the baby is a boy or a girl.  · Your body increases in girth and you may feel bloated.  · Feeling sick to your stomach (nauseous) and throwing up (vomiting). If the vomiting is uncontrollable, call your caregiver.  · Your breasts will begin to enlarge and become tender.  · Your nipples may stick out more and become darker.  · The need to urinate more. Painful urination may mean you have a bladder infection.  · Tiring easily.  · Loss of appetite.  · Cravings for certain kinds of food.  · At first, you may gain or lose a couple of pounds.  · You may have changes in your emotions from day to day (excited to be pregnant or concerned something may go wrong with the pregnancy and baby).  · You may have more vivid and strange dreams.  HOME CARE INSTRUCTIONS   ·   It is very important to avoid all smoking, alcohol and non-prescribed drugs during your pregnancy. These affect the formation and growth of the baby. Avoid chemicals while pregnant to ensure the delivery of a healthy infant.  · Start your prenatal visits by the 12th week of pregnancy. They are usually scheduled monthly at first, then more often in the last 2 months before delivery. Keep your caregiver's appointments. Follow your caregiver's instructions regarding medicine use, blood and lab tests, exercise, and diet.  · During pregnancy, you are providing food for you and your baby. Eat regular, well-balanced  meals. Choose foods such as meat, fish, milk and other low fat dairy products, vegetables, fruits, and whole-grain breads and cereals. Your caregiver will tell you of the ideal weight gain.  · You can help morning sickness by keeping soda crackers at the bedside. Eat a couple before arising in the morning. You may want to use the crackers without salt on them.  · Eating 4 to 5 small meals rather than 3 large meals a day also may help the nausea and vomiting.  · Drinking liquids between meals instead of during meals also seems to help nausea and vomiting.  · A physical sexual relationship may be continued throughout pregnancy if there are no other problems. Problems may be early (premature) leaking of amniotic fluid from the membranes, vaginal bleeding, or belly (abdominal) pain.  · Exercise regularly if there are no restrictions. Check with your caregiver or physical therapist if you are unsure of the safety of some of your exercises. Greater weight gain will occur in the last 2 trimesters of pregnancy. Exercising will help:  · Control your weight.  · Keep you in shape.  · Prepare you for labor and delivery.  · Help you lose your pregnancy weight after you deliver your baby.  · Wear a good support or jogging bra for breast tenderness during pregnancy. This may help if worn during sleep too.  · Ask when prenatal classes are available. Begin classes when they are offered.  · Do not use hot tubs, steam rooms, or saunas.  · Wear your seat belt when driving. This protects you and your baby if you are in an accident.  · Avoid raw meat, uncooked cheese, cat litter boxes, and soil used by cats throughout the pregnancy. These carry germs that can cause birth defects in the baby.  · The first trimester is a good time to visit your dentist for your dental health. Getting your teeth cleaned is okay. Use a softer toothbrush and brush gently during pregnancy.  · Ask for help if you have financial, counseling, or nutritional needs  during pregnancy. Your caregiver will be able to offer counseling for these needs as well as refer you for other special needs.  · Do not take any medicines or herbs unless told by your caregiver.  · Inform your caregiver if there is any mental or physical domestic violence.  · Make a list of emergency phone numbers of family, friends, hospital, and police and fire departments.  · Write down your questions. Take them to your prenatal visit.  · Do not douche.  · Do not cross your legs.  · If you have to stand for long periods of time, rotate you feet or take small steps in a circle.  · You may have more vaginal secretions that may require a sanitary pad. Do not use tampons or scented sanitary pads.  MEDICINES AND DRUG USE IN PREGNANCY  ·   Take prenatal vitamins as directed. The vitamin should contain 1 milligram of folic acid. Keep all vitamins out of reach of children. Only a couple vitamins or tablets containing iron may be fatal to a baby or young child when ingested.  · Avoid use of all medicines, including herbs, over-the-counter medicines, not prescribed or suggested by your caregiver. Only take over-the-counter or prescription medicines for pain, discomfort, or fever as directed by your caregiver. Do not use aspirin, ibuprofen, or naproxen unless directed by your caregiver.  · Let your caregiver also know about herbs you may be using.  · Alcohol is related to a number of birth defects. This includes fetal alcohol syndrome. All alcohol, in any form, should be avoided completely. Smoking will cause low birth rate and premature babies.  · Street or illegal drugs are very harmful to the baby. They are absolutely forbidden. A baby born to an addicted mother will be addicted at birth. The baby will go through the same withdrawal an adult does.  · Let your caregiver know about any medicines that you have to take and for what reason you take them.  SEEK MEDICAL CARE IF:   You have any concerns or worries during your  pregnancy. It is better to call with your questions if you feel they cannot wait, rather than worry about them.  SEEK IMMEDIATE MEDICAL CARE IF:   · An unexplained oral temperature above 102° F (38.9° C) develops, or as your caregiver suggests.  · You have leaking of fluid from the vagina (birth canal). If leaking membranes are suspected, take your temperature and inform your caregiver of this when you call.  · There is vaginal spotting or bleeding. Notify your caregiver of the amount and how many pads are used.  · You develop a bad smelling vaginal discharge with a change in the color.  · You continue to feel sick to your stomach (nauseated) and have no relief from remedies suggested. You vomit blood or coffee ground-like materials.  · You lose more than 2 pounds of weight in 1 week.  · You gain more than 2 pounds of weight in 1 week and you notice swelling of your face, hands, feet, or legs.  · You gain 5 pounds or more in 1 week (even if you do not have swelling of your hands, face, legs, or feet).  · You get exposed to German measles and have never had them.  · You are exposed to fifth disease or chickenpox.  · You develop belly (abdominal) pain. Round ligament discomfort is a common non-cancerous (benign) cause of abdominal pain in pregnancy. Your caregiver still must evaluate this.  · You develop headache, fever, diarrhea, pain with urination, or shortness of breath.  · You fall or are in a car accident or have any kind of trauma.  · There is mental or physical violence in your home.  Document Released: 11/12/2001 Document Revised: 08/12/2012 Document Reviewed: 05/16/2009  [employer]® Patient Information ©2014 [employer].

## 2013-10-22 LAB — PRESCRIPTION MONITORING PROFILE (19 PANEL)
Amphetamine/Meth: NEGATIVE ng/mL
Barbiturate Screen, Urine: NEGATIVE ng/mL
Benzodiazepine Screen, Urine: NEGATIVE ng/mL
Buprenorphine, Urine: NEGATIVE ng/mL
Carisoprodol, Urine: NEGATIVE ng/mL
Creatinine, Urine: 410.48 mg/dL (ref >20.0)
Methaqualone: NEGATIVE ng/mL
Nitrites, Initial: NEGATIVE ug/mL
Opiate Screen, Urine: NEGATIVE ng/mL
Phencyclidine, Ur: NEGATIVE ng/mL
Propoxyphene: NEGATIVE ng/mL
Tapentadol, urine: NEGATIVE ng/mL
Zolpidem, Urine: NEGATIVE ng/mL

## 2013-10-22 LAB — CULTURE, OB URINE
Colony Count: NO GROWTH
Organism ID, Bacteria: NO GROWTH

## 2013-10-22 LAB — HEMOGLOBINOPATHY EVALUATION
Hgb A2 Quant: 2.5 % (ref 2.2–3.2)
Hgb F Quant: 1.6 % (ref 0.0–2.0)
Hgb S Quant: 0 %

## 2013-10-22 LAB — CANNABANOIDS (GC/LC/MS), URINE: THC-COOH (GC/LC/MS), ur confirm: >1000 ng/mL — AB

## 2013-10-24 ENCOUNTER — Encounter: Payer: Self-pay | Admitting: Advanced Practice Midwife

## 2013-10-24 DIAGNOSIS — F1210 Cannabis abuse, uncomplicated: Secondary | ICD-10-CM | POA: Insufficient documentation

## 2013-11-10 ENCOUNTER — Encounter: Payer: Self-pay | Admitting: Obstetrics and Gynecology

## 2013-11-10 ENCOUNTER — Ambulatory Visit (INDEPENDENT_AMBULATORY_CARE_PROVIDER_SITE_OTHER): Payer: Medicaid Other | Admitting: Obstetrics and Gynecology

## 2013-11-10 VITALS — BP 118/69 | Wt 124.4 lb

## 2013-11-10 DIAGNOSIS — F1920 Other psychoactive substance dependence, uncomplicated: Secondary | ICD-10-CM

## 2013-11-10 DIAGNOSIS — O99340 Other mental disorders complicating pregnancy, unspecified trimester: Secondary | ICD-10-CM

## 2013-11-10 DIAGNOSIS — Z8659 Personal history of other mental and behavioral disorders: Secondary | ICD-10-CM

## 2013-11-10 DIAGNOSIS — F1210 Cannabis abuse, uncomplicated: Secondary | ICD-10-CM

## 2013-11-10 LAB — POCT URINALYSIS DIP (DEVICE)
Bilirubin Urine: NEGATIVE
Glucose, UA: NEGATIVE mg/dL
Hgb urine dipstick: NEGATIVE
Ketones, ur: NEGATIVE mg/dL
Leukocytes, UA: NEGATIVE
Specific Gravity, Urine: 1.015 (ref 1.005–1.030)
Urobilinogen, UA: 0.2 mg/dL (ref 0.0–1.0)

## 2013-11-10 MED ORDER — CONCEPT OB 130-92.4-1 MG PO CAPS: 1.0000 | ORAL_CAPSULE | ORAL | Status: DC

## 2013-11-10 NOTE — Progress Notes (Signed)
Pulse: 89 Patient has lost weight since last visit.

## 2013-11-10 NOTE — Patient Instructions (Addendum)
Pregnancy - First Trimester During sexual intercourse, millions of sperm go into the vagina. Only 1 sperm will penetrate and fertilize the female egg while it is in the Fallopian tube. One week later, the fertilized egg implants into the wall of the uterus. An embryo begins to develop into a baby. At 6 to 8 weeks, the eyes and face are formed and the heartbeat can be seen on ultrasound. At the end of 12 weeks (first trimester), all the baby's organs are formed. Now that you are pregnant, you will want to do everything you can to have a healthy baby. Two of the most important things are to get good prenatal care and follow your caregiver's instructions. Prenatal care is all the medical care you receive before the baby's birth. It is given to prevent, find, and treat problems during the pregnancy and childbirth. PRENATAL EXAMS  During prenatal visits, your weight, blood pressure, and urine are checked. This is done to make sure you are healthy and progressing normally during the pregnancy.  A pregnant woman should gain 25 to 35 pounds during the pregnancy. However, if you are overweight or underweight, your caregiver will advise you regarding your weight.  Your caregiver will ask and answer questions for you.  Blood work, cervical cultures, other necessary tests, and a Pap test are done during your prenatal exams. These tests are done to check on your health and the probable health of your baby. Tests are strongly recommended and done for HIV with your permission. This is the virus that causes AIDS. These tests are done because medicines can be given to help prevent your baby from being born with this infection should you have been infected without knowing it. Blood work is also used to find out your blood type, previous infections, and follow your blood levels (hemoglobin).  Low hemoglobin (anemia) is common during pregnancy. Iron and vitamins are given to help prevent this. Later in the pregnancy, blood  tests for diabetes will be done along with any other tests if any problems develop.  You may need other tests to make sure you and the baby are doing well. CHANGES DURING THE FIRST TRIMESTER  Your body goes through many changes during pregnancy. They vary from person to person. Talk to your caregiver about changes you notice and are concerned about. Changes can include:  Your menstrual period stops.  The egg and sperm carry the genes that determine what you look like. Genes from you and your partner are forming a baby. The female genes determine whether the baby is a boy or a girl.  Your body increases in girth and you may feel bloated.  Feeling sick to your stomach (nauseous) and throwing up (vomiting). If the vomiting is uncontrollable, call your caregiver.  Your breasts will begin to enlarge and become tender.  Your nipples may stick out more and become darker.  The need to urinate more. Painful urination may mean you have a bladder infection.  Tiring easily.  Loss of appetite.  Cravings for certain kinds of food.  At first, you may gain or lose a couple of pounds.  You may have changes in your emotions from day to day (excited to be pregnant or concerned something may go wrong with the pregnancy and baby).  You may have more vivid and strange dreams. HOME CARE INSTRUCTIONS   It is very important to avoid all smoking, alcohol and non-prescribed drugs during your pregnancy. These affect the formation and growth of the baby.   Avoid chemicals while pregnant to ensure the delivery of a healthy infant.  Start your prenatal visits by the 12th week of pregnancy. They are usually scheduled monthly at first, then more often in the last 2 months before delivery. Keep your caregiver's appointments. Follow your caregiver's instructions regarding medicine use, blood and lab tests, exercise, and diet.  During pregnancy, you are providing food for you and your baby. Eat regular, well-balanced  meals. Choose foods such as meat, fish, milk and other low fat dairy products, vegetables, fruits, and whole-grain breads and cereals. Your caregiver will tell you of the ideal weight gain.  You can help morning sickness by keeping soda crackers at the bedside. Eat a couple before arising in the morning. You may want to use the crackers without salt on them.  Eating 4 to 5 small meals rather than 3 large meals a day also may help the nausea and vomiting.  Drinking liquids between meals instead of during meals also seems to help nausea and vomiting.  A physical sexual relationship may be continued throughout pregnancy if there are no other problems. Problems may be early (premature) leaking of amniotic fluid from the membranes, vaginal bleeding, or belly (abdominal) pain.  Exercise regularly if there are no restrictions. Check with your caregiver or physical therapist if you are unsure of the safety of some of your exercises. Greater weight gain will occur in the last 2 trimesters of pregnancy. Exercising will help:  Control your weight.  Keep you in shape.  Prepare you for labor and delivery.  Help you lose your pregnancy weight after you deliver your baby.  Wear a good support or jogging bra for breast tenderness during pregnancy. This may help if worn during sleep too.  Ask when prenatal classes are available. Begin classes when they are offered.  Do not use hot tubs, steam rooms, or saunas.  Wear your seat belt when driving. This protects you and your baby if you are in an accident.  Avoid raw meat, uncooked cheese, cat litter boxes, and soil used by cats throughout the pregnancy. These carry germs that can cause birth defects in the baby.  The first trimester is a good time to visit your dentist for your dental health. Getting your teeth cleaned is okay. Use a softer toothbrush and brush gently during pregnancy.  Ask for help if you have financial, counseling, or nutritional needs  during pregnancy. Your caregiver will be able to offer counseling for these needs as well as refer you for other special needs.  Do not take any medicines or herbs unless told by your caregiver.  Inform your caregiver if there is any mental or physical domestic violence.  Make a list of emergency phone numbers of family, friends, hospital, and police and fire departments.  Write down your questions. Take them to your prenatal visit.  Do not douche.  Do not cross your legs.  If you have to stand for long periods of time, rotate you feet or take small steps in a circle.  You may have more vaginal secretions that may require a sanitary pad. Do not use tampons or scented sanitary pads. MEDICINES AND DRUG USE IN PREGNANCY  Take prenatal vitamins as directed. The vitamin should contain 1 milligram of folic acid. Keep all vitamins out of reach of children. Only a couple vitamins or tablets containing iron may be fatal to a baby or young child when ingested.  Avoid use of all medicines, including herbs, over-the-counter medicines, not   prescribed or suggested by your caregiver. Only take over-the-counter or prescription medicines for pain, discomfort, or fever as directed by your caregiver. Do not use aspirin, ibuprofen, or naproxen unless directed by your caregiver.  Let your caregiver also know about herbs you may be using.  Alcohol is related to a number of birth defects. This includes fetal alcohol syndrome. All alcohol, in any form, should be avoided completely. Smoking will cause low birth rate and premature babies.  Street or illegal drugs are very harmful to the baby. They are absolutely forbidden. A baby born to an addicted mother will be addicted at birth. The baby will go through the same withdrawal an adult does.  Let your caregiver know about any medicines that you have to take and for what reason you take them. SEEK MEDICAL CARE IF:  You have any concerns or worries during your  pregnancy. It is better to call with your questions if you feel they cannot wait, rather than worry about them. SEEK IMMEDIATE MEDICAL CARE IF:   An unexplained oral temperature above 102 F (38.9 C) develops, or as your caregiver suggests.  You have leaking of fluid from the vagina (birth canal). If leaking membranes are suspected, take your temperature and inform your caregiver of this when you call.  There is vaginal spotting or bleeding. Notify your caregiver of the amount and how many pads are used.  You develop a bad smelling vaginal discharge with a change in the color.  You continue to feel sick to your stomach (nauseated) and have no relief from remedies suggested. You vomit blood or coffee ground-like materials.  You lose more than 2 pounds of weight in 1 week.  You gain more than 2 pounds of weight in 1 week and you notice swelling of your face, hands, feet, or legs.  You gain 5 pounds or more in 1 week (even if you do not have swelling of your hands, face, legs, or feet).  You get exposed to [place] measles and have never had them.  You are exposed to fifth disease or chickenpox.  You develop belly (abdominal) pain. Round ligament discomfort is a common non-cancerous (benign) cause of abdominal pain in pregnancy. Your caregiver still must evaluate this.  You develop headache, fever, diarrhea, pain with urination, or shortness of breath.  You fall or are in a car accident or have any kind of trauma.  There is mental or physical violence in your home. Document Released: 11/12/2001 Document Revised: 08/12/2012 Document Reviewed: 05/16/2009 [facility] Patient Information 2014 [place], [place]. Depression, Adult Depression refers to feeling sad, low, down in the dumps, blue, gloomy, or empty. In general, there are two kinds of depression: 1. Depression that we all experience from time to time because of upsetting life experiences, including the loss of a job or the ending of a  relationship (normal sadness or normal grief). This kind of depression is considered normal, is short lived, and resolves within a few days to 2 weeks. (Depression experienced after the loss of a loved one is called bereavement. Bereavement often lasts longer than 2 weeks but normally gets better with time.) 2. Clinical depression, which lasts longer than normal sadness or normal grief or interferes with your ability to function at home, at work, and in school. It also interferes with your personal relationships. It affects almost every aspect of your life. Clinical depression is an illness. Symptoms of depression also can be caused by conditions other than normal sadness and grief or  clinical depression. Examples of these conditions are listed as follows:  Physical illness Some physical illnesses, including underactive thyroid gland (hypothyroidism), severe anemia, specific types of cancer, diabetes, uncontrolled seizures, heart and lung problems, strokes, and chronic pain are commonly associated with symptoms of depression.  Side effects of some prescription medicine In some people, certain types of prescription medicine can cause symptoms of depression.  Substance abuse Abuse of alcohol and illicit drugs can cause symptoms of depression. SYMPTOMS Symptoms of normal sadness and normal grief include the following:  Feeling sad or crying for short periods of time.  Not caring about anything (apathy).  Difficulty sleeping or sleeping too much.  No longer able to enjoy the things you used to enjoy.  Desire to be by oneself all the time (social isolation).  Lack of energy or motivation.  Difficulty concentrating or remembering.  Change in appetite or weight.  Restlessness or agitation. Symptoms of clinical depression include the same symptoms of normal sadness or normal grief and also the following symptoms:  Feeling sad or crying all the time.  Feelings of guilt or  worthlessness.  Feelings of hopelessness or helplessness.  Thoughts of suicide or the desire to harm yourself (suicidal ideation).  Loss of touch with reality (psychotic symptoms). Seeing or hearing things that are not real (hallucinations) or having false beliefs about your life or the people around you (delusions and paranoia). DIAGNOSIS  The diagnosis of clinical depression usually is based on the severity and duration of the symptoms. Your caregiver also will ask you questions about your medical history and substance use to find out if physical illness, use of prescription medicine, or substance abuse is causing your depression. Your caregiver also may order blood tests. TREATMENT  Typically, normal sadness and normal grief do not require treatment. However, sometimes antidepressant medicine is prescribed for bereavement to ease the depressive symptoms until they resolve. The treatment for clinical depression depends on the severity of your symptoms but typically includes antidepressant medicine, counseling with a mental health professional, or a combination of both. Your caregiver will help to determine what treatment is best for you. Depression caused by physical illness usually goes away with appropriate medical treatment of the illness. If prescription medicine is causing depression, talk with your caregiver about stopping the medicine, decreasing the dose, or substituting another medicine. Depression caused by abuse of alcohol or illicit drugs abuse goes away with abstinence from these substances. Some adults need professional help in order to stop drinking or using drugs. SEEK IMMEDIATE CARE IF:  You have thoughts about hurting yourself or others.  You lose touch with reality (have psychotic symptoms).  You are taking medicine for depression and have a serious side effect. FOR MORE INFORMATION National Alliance on Mental Illness: www.nami.[employer]:  http://www.maynard.net/ Document Released: 11/15/2000 Document Revised: 05/19/2012 Document Reviewed: 02/17/2012 [facility] Patient Information 2014 [place], [place]. Genetic Testing This testing is done to find many different types of diseases that have abnormal genetics. The testing can be very expensive and is often not covered by insurance. Your caregiver will let you know why the testing is being done and the value of the testing to you. You can then discuss with your caregiver whether or not to proceed with the testing. This test will look for breast cancer genes that may be identified in a person who may have an increased chance for breast cancer. It is also good for colon cancer genetic testing, cardiovascular disease testing, Tay-Sachs disease  testing (development of mental retardation in the first few months of life), cystic fibrosis genetic testing. PREPARATION FOR TEST There is no preparation for this test. NORMAL FINDINGS No genetic mutation.  Ranges for normal findings may vary among different laboratories and hospitals. You should always check with your doctor after having lab work or other tests done to discuss the meaning of your test results and whether your values are considered within normal limits. MEANING OF TEST Your caregiver will go over the test results with you and discuss the importance and meaning of your results, as well as treatment options and the need for additional tests if necessary. OBTAINING TEST RESULTS It is your responsibility to obtain your test results. Ask the lab or department performing the test when and how you will get your results. Document Released: 12/11/2004 Document Revised: 02/10/2012 Document Reviewed: 10/29/2008 [facility] Patient Information 2014 [place], [place].

## 2013-11-19 ENCOUNTER — Encounter: Payer: Self-pay | Admitting: *Deleted

## 2013-11-19 DIAGNOSIS — F1920 Other psychoactive substance dependence, uncomplicated: Secondary | ICD-10-CM | POA: Insufficient documentation

## 2013-11-19 DIAGNOSIS — O99340 Other mental disorders complicating pregnancy, unspecified trimester: Secondary | ICD-10-CM | POA: Insufficient documentation

## 2013-11-19 DIAGNOSIS — O99320 Drug use complicating pregnancy, unspecified trimester: Secondary | ICD-10-CM

## 2013-12-02 NOTE — L&D Delivery Note (Signed)
Delivery Note At 3:20 PM a viable female was delivered via Vaginal, Spontaneous Delivery (Presentation: Left Occiput Anterior).  APGAR: 9, 9; weight 6 lb 5.1 oz (2865 g).   Placenta status: Intact, Spontaneous.  Cord: 3 vessels with the following complications: None.    Anesthesia: Epidural  Episiotomy: None Lacerations: None Suture Repair: na Est. Blood Loss (mL): 350  Mom to postpartum.  Baby to Couplet care / Skin to Skin.  Pt pushed with good maternal effort to deliver a liveborn female via NSVD with spontaneous cry.   Baby placed on maternal abdomen.  Delayed cord clamping performed.  Cord cut by FOB.  Placenta delivered intact with 3V cord via traction and pitocin.  No tears. No complications.  Mom and baby to postpartum.   Audrey Schultz Audrey Schultz 05/08/2014, 4:46 PM

## 2013-12-10 ENCOUNTER — Ambulatory Visit (INDEPENDENT_AMBULATORY_CARE_PROVIDER_SITE_OTHER): Payer: Medicaid Other | Admitting: Obstetrics and Gynecology

## 2013-12-10 VITALS — BP 108/68 | Wt 134.5 lb

## 2013-12-10 DIAGNOSIS — Z3402 Encounter for supervision of normal first pregnancy, second trimester: Secondary | ICD-10-CM

## 2013-12-10 DIAGNOSIS — F1920 Other psychoactive substance dependence, uncomplicated: Secondary | ICD-10-CM

## 2013-12-10 DIAGNOSIS — O99340 Other mental disorders complicating pregnancy, unspecified trimester: Secondary | ICD-10-CM

## 2013-12-10 DIAGNOSIS — O99320 Drug use complicating pregnancy, unspecified trimester: Secondary | ICD-10-CM

## 2013-12-10 LAB — POCT URINALYSIS DIP (DEVICE)
Bilirubin Urine: NEGATIVE
Glucose, UA: NEGATIVE mg/dL
Hgb urine dipstick: NEGATIVE
Ketones, ur: NEGATIVE mg/dL
LEUKOCYTES UA: NEGATIVE
Nitrite: NEGATIVE
PH: 7 (ref 5.0–8.0)
PROTEIN: NEGATIVE mg/dL
SPECIFIC GRAVITY, URINE: 1.02 (ref 1.005–1.030)
UROBILINOGEN UA: 0.2 mg/dL (ref 0.0–1.0)

## 2013-12-10 NOTE — Progress Notes (Signed)
Anatomy US scheduled 1/28. N/V improved. # TWG. Denies marijuana use. Fundus U-2. Recommended classes.

## 2013-12-10 NOTE — Patient Instructions (Signed)
Second Trimester of Pregnancy The second trimester is from week 13 through week 28, months 4 through 6. The second trimester is often a time when you feel your best. Your body has also adjusted to being pregnant, and you begin to feel better physically. Usually, morning sickness has lessened or quit completely, you may have more energy, and you may have an increase in appetite. The second trimester is also a time when the fetus is growing rapidly. At the end of the sixth month, the fetus is about 9 inches long and weighs about 1 pounds. You will likely begin to feel the baby move (quickening) between 18 and 20 weeks of the pregnancy. BODY CHANGES Your body goes through many changes during pregnancy. The changes vary from woman to woman.   Your weight will continue to increase. You will notice your lower abdomen bulging out.  You may begin to get stretch marks on your hips, abdomen, and breasts.  You may develop headaches that can be relieved by medicines approved by your caregiver.  You may urinate more often because the fetus is pressing on your bladder.  You may develop or continue to have heartburn as a result of your pregnancy.  You may develop constipation because certain hormones are causing the muscles that push waste through your intestines to slow down.  You may develop hemorrhoids or swollen, bulging veins (varicose veins).  You may have back pain because of the weight gain and pregnancy hormones relaxing your joints between the bones in your pelvis and as a result of a shift in weight and the muscles that support your balance.  Your breasts will continue to grow and be tender.  Your gums may bleed and may be sensitive to brushing and flossing.  Dark spots or blotches (chloasma, mask of pregnancy) may develop on your face. This will likely fade after the baby is born.  A dark line from your belly button to the pubic area (linea nigra) may appear. This will likely fade after the  baby is born. WHAT TO EXPECT AT YOUR PRENATAL VISITS During a routine prenatal visit:  You will be weighed to make sure you and the fetus are growing normally.  Your blood pressure will be taken.  Your abdomen will be measured to track your baby's growth.  The fetal heartbeat will be listened to.  Any test results from the previous visit will be discussed. Your caregiver may ask you:  How you are feeling.  If you are feeling the baby move.  If you have had any abnormal symptoms, such as leaking fluid, bleeding, severe headaches, or abdominal cramping.  If you have any questions. Other tests that may be performed during your second trimester include:  Blood tests that check for:  Low iron levels (anemia).  Gestational diabetes (between 24 and 28 weeks).  Rh antibodies.  Urine tests to check for infections, diabetes, or protein in the urine.  An ultrasound to confirm the proper growth and development of the baby.  An amniocentesis to check for possible genetic problems.  Fetal screens for spina bifida and Down syndrome. HOME CARE INSTRUCTIONS   Avoid all smoking, herbs, alcohol, and unprescribed drugs. These chemicals affect the formation and growth of the baby.  Follow your caregiver's instructions regarding medicine use. There are medicines that are either safe or unsafe to take during pregnancy.  Exercise only as directed by your caregiver. Experiencing uterine cramps is a good sign to stop exercising.  Continue to eat regular,   healthy meals.  Wear a good support bra for breast tenderness.  Do not use hot tubs, steam rooms, or saunas.  Wear your seat belt at all times when driving.  Avoid raw meat, uncooked cheese, cat litter boxes, and soil used by cats. These carry germs that can cause birth defects in the baby.  Take your prenatal vitamins.  Try taking a stool softener (if your caregiver approves) if you develop constipation. Eat more high-fiber foods,  such as fresh vegetables or fruit and whole grains. Drink plenty of fluids to keep your urine clear or pale yellow.  Take warm sitz baths to soothe any pain or discomfort caused by hemorrhoids. Use hemorrhoid cream if your caregiver approves.  If you develop varicose veins, wear support hose. Elevate your feet for 15 minutes, 3 4 times a day. Limit salt in your diet.  Avoid heavy lifting, wear low heel shoes, and practice good posture.  Rest with your legs elevated if you have leg cramps or low back pain.  Visit your dentist if you have not gone yet during your pregnancy. Use a soft toothbrush to brush your teeth and be gentle when you floss.  A sexual relationship may be continued unless your caregiver directs you otherwise.  Continue to go to all your prenatal visits as directed by your caregiver. SEEK MEDICAL CARE IF:   You have dizziness.  You have mild pelvic cramps, pelvic pressure, or nagging pain in the abdominal area.  You have persistent nausea, vomiting, or diarrhea.  You have a bad smelling vaginal discharge.  You have pain with urination. SEEK IMMEDIATE MEDICAL CARE IF:   You have a fever.  You are leaking fluid from your vagina.  You have spotting or bleeding from your vagina.  You have severe abdominal cramping or pain.  You have rapid weight gain or loss.  You have shortness of breath with chest pain.  You notice sudden or extreme swelling of your face, hands, ankles, feet, or legs.  You have not felt your baby move in over an hour.  You have severe headaches that do not go away with medicine.  You have vision changes. Document Released: 11/12/2001 Document Revised: 07/21/2013 Document Reviewed: 01/19/2013 [employer] Patient Information 2014 [employer].  

## 2013-12-10 NOTE — Progress Notes (Signed)
Pulse- 101 

## 2013-12-29 ENCOUNTER — Ambulatory Visit (HOSPITAL_COMMUNITY): Payer: Medicaid Other

## 2013-12-29 ENCOUNTER — Ambulatory Visit (HOSPITAL_COMMUNITY)
Admission: RE | Admit: 2013-12-29 | Discharge: 2013-12-29 | Disposition: A | Discharge status: Home / Self Care | Payer: Medicaid Other | Source: Ambulatory Visit | Attending: Obstetrics and Gynecology | Admitting: Obstetrics and Gynecology

## 2013-12-29 DIAGNOSIS — F1910 Other psychoactive substance abuse, uncomplicated: Secondary | ICD-10-CM | POA: Insufficient documentation

## 2013-12-29 DIAGNOSIS — O99320 Drug use complicating pregnancy, unspecified trimester: Secondary | ICD-10-CM

## 2013-12-29 DIAGNOSIS — Z8659 Personal history of other mental and behavioral disorders: Secondary | ICD-10-CM

## 2013-12-29 DIAGNOSIS — F1210 Cannabis abuse, uncomplicated: Secondary | ICD-10-CM

## 2013-12-29 DIAGNOSIS — F1920 Other psychoactive substance dependence, uncomplicated: Secondary | ICD-10-CM

## 2013-12-29 DIAGNOSIS — O358XX0 Maternal care for other (suspected) fetal abnormality and damage, not applicable or unspecified: Secondary | ICD-10-CM

## 2013-12-29 DIAGNOSIS — F329 Major depressive disorder, single episode, unspecified: Secondary | ICD-10-CM | POA: Insufficient documentation

## 2013-12-29 DIAGNOSIS — O35BXX0 Maternal care for other (suspected) fetal abnormality and damage, fetal cardiac anomalies, not applicable or unspecified: Secondary | ICD-10-CM

## 2013-12-29 DIAGNOSIS — O99340 Other mental disorders complicating pregnancy, unspecified trimester: Secondary | ICD-10-CM | POA: Insufficient documentation

## 2013-12-29 DIAGNOSIS — O09899 Supervision of other high risk pregnancies, unspecified trimester: Secondary | ICD-10-CM | POA: Insufficient documentation

## 2013-12-29 DIAGNOSIS — F3289 Other specified depressive episodes: Secondary | ICD-10-CM | POA: Insufficient documentation

## 2013-12-29 DIAGNOSIS — Z3689 Encounter for other specified antenatal screening: Secondary | ICD-10-CM | POA: Insufficient documentation

## 2014-01-04 DIAGNOSIS — O358XX0 Maternal care for other (suspected) fetal abnormality and damage, not applicable or unspecified: Secondary | ICD-10-CM | POA: Insufficient documentation

## 2014-01-04 DIAGNOSIS — O35BXX0 Maternal care for other (suspected) fetal abnormality and damage, fetal cardiac anomalies, not applicable or unspecified: Secondary | ICD-10-CM | POA: Insufficient documentation

## 2014-01-05 ENCOUNTER — Ambulatory Visit (INDEPENDENT_AMBULATORY_CARE_PROVIDER_SITE_OTHER): Payer: Medicaid Other | Admitting: Obstetrics and Gynecology

## 2014-01-05 VITALS — BP 120/71 | Temp 98.4°F | Wt 138.6 lb

## 2014-01-05 DIAGNOSIS — Z3480 Encounter for supervision of other normal pregnancy, unspecified trimester: Secondary | ICD-10-CM

## 2014-01-05 DIAGNOSIS — F1910 Other psychoactive substance abuse, uncomplicated: Secondary | ICD-10-CM

## 2014-01-05 DIAGNOSIS — Z3490 Encounter for supervision of normal pregnancy, unspecified, unspecified trimester: Secondary | ICD-10-CM

## 2014-01-05 DIAGNOSIS — O99322 Drug use complicating pregnancy, second trimester: Secondary | ICD-10-CM

## 2014-01-05 DIAGNOSIS — O99340 Other mental disorders complicating pregnancy, unspecified trimester: Secondary | ICD-10-CM

## 2014-01-05 DIAGNOSIS — O35BXX0 Maternal care for other (suspected) fetal abnormality and damage, fetal cardiac anomalies, not applicable or unspecified: Secondary | ICD-10-CM | POA: Insufficient documentation

## 2014-01-05 DIAGNOSIS — O358XX0 Maternal care for other (suspected) fetal abnormality and damage, not applicable or unspecified: Secondary | ICD-10-CM

## 2014-01-05 LAB — POCT URINALYSIS DIP (DEVICE)
Bilirubin Urine: NEGATIVE
GLUCOSE, UA: NEGATIVE mg/dL
HGB URINE DIPSTICK: NEGATIVE
Ketones, ur: NEGATIVE mg/dL
Leukocytes, UA: NEGATIVE
NITRITE: NEGATIVE
Protein, ur: NEGATIVE mg/dL
Specific Gravity, Urine: 1.015 (ref 1.005–1.030)
UROBILINOGEN UA: 0.2 mg/dL (ref 0.0–1.0)
pH: 7 (ref 5.0–8.0)

## 2014-01-05 NOTE — Progress Notes (Signed)
+  THC - discussed need to stop and will recheck early third tri.  MFM recommends F/U US>scheduled. . Isolated cardiac EIF> will get quad screen today. RLP discussed. TWG 10# normal.  Reviewed danger signs.

## 2014-01-05 NOTE — Progress Notes (Signed)
P-100 

## 2014-01-05 NOTE — Patient Instructions (Signed)
Second Trimester of Pregnancy The second trimester is from week 13 through week 28, months 4 through 6. The second trimester is often a time when you feel your best. Your body has also adjusted to being pregnant, and you begin to feel better physically. Usually, morning sickness has lessened or quit completely, you may have more energy, and you may have an increase in appetite. The second trimester is also a time when the fetus is growing rapidly. At the end of the sixth month, the fetus is about 9 inches long and weighs about 1 pounds. You will likely begin to feel the baby move (quickening) between 18 and 20 weeks of the pregnancy. BODY CHANGES Your body goes through many changes during pregnancy. The changes vary from woman to woman.   Your weight will continue to increase. You will notice your lower abdomen bulging out.  You may begin to get stretch marks on your hips, abdomen, and breasts.  You may develop headaches that can be relieved by medicines approved by your caregiver.  You may urinate more often because the fetus is pressing on your bladder.  You may develop or continue to have heartburn as a result of your pregnancy.  You may develop constipation because certain hormones are causing the muscles that push waste through your intestines to slow down.  You may develop hemorrhoids or swollen, bulging veins (varicose veins).  You may have back pain because of the weight gain and pregnancy hormones relaxing your joints between the bones in your pelvis and as a result of a shift in weight and the muscles that support your balance.  Your breasts will continue to grow and be tender.  Your gums may bleed and may be sensitive to brushing and flossing.  Dark spots or blotches (chloasma, mask of pregnancy) may develop on your face. This will likely fade after the baby is born.  A dark line from your belly button to the pubic area (linea nigra) may appear. This will likely fade after the  baby is born. WHAT TO EXPECT AT YOUR PRENATAL VISITS During a routine prenatal visit:  You will be weighed to make sure you and the fetus are growing normally.  Your blood pressure will be taken.  Your abdomen will be measured to track your baby's growth.  The fetal heartbeat will be listened to.  Any test results from the previous visit will be discussed. Your caregiver may ask you:  How you are feeling.  If you are feeling the baby move.  If you have had any abnormal symptoms, such as leaking fluid, bleeding, severe headaches, or abdominal cramping.  If you have any questions. Other tests that may be performed during your second trimester include:  Blood tests that check for:  Low iron levels (anemia).  Gestational diabetes (between 24 and 28 weeks).  Rh antibodies.  Urine tests to check for infections, diabetes, or protein in the urine.  An ultrasound to confirm the proper growth and development of the baby.  An amniocentesis to check for possible genetic problems.  Fetal screens for spina bifida and Down syndrome. HOME CARE INSTRUCTIONS   Avoid all smoking, herbs, alcohol, and unprescribed drugs. These chemicals affect the formation and growth of the baby.  Follow your caregiver's instructions regarding medicine use. There are medicines that are either safe or unsafe to take during pregnancy.  Exercise only as directed by your caregiver. Experiencing uterine cramps is a good sign to stop exercising.  Continue to eat regular,   healthy meals.  Wear a good support bra for breast tenderness.  Do not use hot tubs, steam rooms, or saunas.  Wear your seat belt at all times when driving.  Avoid raw meat, uncooked cheese, cat litter boxes, and soil used by cats. These carry germs that can cause birth defects in the baby.  Take your prenatal vitamins.  Try taking a stool softener (if your caregiver approves) if you develop constipation. Eat more high-fiber foods,  such as fresh vegetables or fruit and whole grains. Drink plenty of fluids to keep your urine clear or pale yellow.  Take warm sitz baths to soothe any pain or discomfort caused by hemorrhoids. Use hemorrhoid cream if your caregiver approves.  If you develop varicose veins, wear support hose. Elevate your feet for 15 minutes, 3 4 times a day. Limit salt in your diet.  Avoid heavy lifting, wear low heel shoes, and practice good posture.  Rest with your legs elevated if you have leg cramps or low back pain.  Visit your dentist if you have not gone yet during your pregnancy. Use a soft toothbrush to brush your teeth and be gentle when you floss.  A sexual relationship may be continued unless your caregiver directs you otherwise.  Continue to go to all your prenatal visits as directed by your caregiver. SEEK MEDICAL CARE IF:   You have dizziness.  You have mild pelvic cramps, pelvic pressure, or nagging pain in the abdominal area.  You have persistent nausea, vomiting, or diarrhea.  You have a bad smelling vaginal discharge.  You have pain with urination. SEEK IMMEDIATE MEDICAL CARE IF:   You have a fever.  You are leaking fluid from your vagina.  You have spotting or bleeding from your vagina.  You have severe abdominal cramping or pain.  You have rapid weight gain or loss.  You have shortness of breath with chest pain.  You notice sudden or extreme swelling of your face, hands, ankles, feet, or legs.  You have not felt your baby move in over an hour.  You have severe headaches that do not go away with medicine.  You have vision changes. Document Released: 11/12/2001 Document Revised: 07/21/2013 Document Reviewed: 01/19/2013 [employer] Patient Information 2014 [employer].  

## 2014-01-07 LAB — AFP, QUAD SCREEN
AFP: 109 IU/mL
Age Alone: 1:1170 {titer}
Curr Gest Age: 20.3 wks.days
Down Syndrome Scr Risk Est: <1:38500 {titer}
HCG TOTAL: 4399 m[IU]/mL
INH: 222.1 pg/mL
Interpretation-AFP: NEGATIVE
MOM FOR AFP: 1.79
MoM for INH: 1.17
MoM for hCG: 0.29
OPEN SPINA BIFIDA: NEGATIVE
Osb Risk: 1:2520 {titer}
Tri 18 Scr Risk Est: NEGATIVE
uE3 Mom: 0.87
uE3 Value: 1.1 ng/mL

## 2014-01-28 ENCOUNTER — Ambulatory Visit (HOSPITAL_COMMUNITY)
Admission: RE | Admit: 2014-01-28 | Discharge: 2014-01-28 | Disposition: A | Discharge status: Home / Self Care | Payer: Medicaid Other | Source: Ambulatory Visit | Attending: Obstetrics and Gynecology | Admitting: Obstetrics and Gynecology

## 2014-01-28 DIAGNOSIS — O09899 Supervision of other high risk pregnancies, unspecified trimester: Secondary | ICD-10-CM | POA: Insufficient documentation

## 2014-01-28 DIAGNOSIS — O99340 Other mental disorders complicating pregnancy, unspecified trimester: Secondary | ICD-10-CM | POA: Insufficient documentation

## 2014-01-28 DIAGNOSIS — O99322 Drug use complicating pregnancy, second trimester: Secondary | ICD-10-CM

## 2014-01-28 DIAGNOSIS — F1910 Other psychoactive substance abuse, uncomplicated: Secondary | ICD-10-CM | POA: Insufficient documentation

## 2014-01-28 DIAGNOSIS — O358XX0 Maternal care for other (suspected) fetal abnormality and damage, not applicable or unspecified: Secondary | ICD-10-CM | POA: Insufficient documentation

## 2014-02-02 ENCOUNTER — Encounter: Payer: Self-pay | Admitting: Family Medicine

## 2014-02-02 ENCOUNTER — Ambulatory Visit (INDEPENDENT_AMBULATORY_CARE_PROVIDER_SITE_OTHER): Payer: Medicaid Other | Admitting: Family Medicine

## 2014-02-02 VITALS — BP 105/59 | Temp 98.2°F | Wt 141.4 lb

## 2014-02-02 DIAGNOSIS — Z3490 Encounter for supervision of normal pregnancy, unspecified, unspecified trimester: Secondary | ICD-10-CM

## 2014-02-02 DIAGNOSIS — Z3480 Encounter for supervision of other normal pregnancy, unspecified trimester: Secondary | ICD-10-CM

## 2014-02-02 DIAGNOSIS — Z8659 Personal history of other mental and behavioral disorders: Secondary | ICD-10-CM

## 2014-02-02 DIAGNOSIS — F1920 Other psychoactive substance dependence, uncomplicated: Secondary | ICD-10-CM

## 2014-02-02 DIAGNOSIS — O99320 Drug use complicating pregnancy, unspecified trimester: Principal | ICD-10-CM

## 2014-02-02 LAB — POCT URINALYSIS DIP (DEVICE)
Bilirubin Urine: NEGATIVE
Glucose, UA: NEGATIVE mg/dL
Hgb urine dipstick: NEGATIVE
KETONES UR: NEGATIVE mg/dL
Nitrite: NEGATIVE
PH: 6.5 (ref 5.0–8.0)
Protein, ur: 30 mg/dL — AB
Urobilinogen, UA: 0.2 mg/dL (ref 0.0–1.0)

## 2014-02-02 NOTE — Patient Instructions (Signed)
Second Trimester of Pregnancy The second trimester is from week 13 through week 28, months 4 through 6. The second trimester is often a time when you feel your best. Your body has also adjusted to being pregnant, and you begin to feel better physically. Usually, morning sickness has lessened or quit completely, you may have more energy, and you may have an increase in appetite. The second trimester is also a time when the fetus is growing rapidly. At the end of the sixth month, the fetus is about 9 inches long and weighs about 1 pounds. You will likely begin to feel the baby move (quickening) between 18 and 20 weeks of the pregnancy. BODY CHANGES Your body goes through many changes during pregnancy. The changes vary from woman to woman.   Your weight will continue to increase. You will notice your lower abdomen bulging out.  You may begin to get stretch marks on your hips, abdomen, and breasts.  You may develop headaches that can be relieved by medicines approved by your caregiver.  You may urinate more often because the fetus is pressing on your bladder.  You may develop or continue to have heartburn as a result of your pregnancy.  You may develop constipation because certain hormones are causing the muscles that push waste through your intestines to slow down.  You may develop hemorrhoids or swollen, bulging veins (varicose veins).  You may have back pain because of the weight gain and pregnancy hormones relaxing your joints between the bones in your pelvis and as a result of a shift in weight and the muscles that support your balance.  Your breasts will continue to grow and be tender.  Your gums may bleed and may be sensitive to brushing and flossing.  Dark spots or blotches (chloasma, mask of pregnancy) may develop on your face. This will likely fade after the baby is born.  A dark line from your belly button to the pubic area (linea nigra) may appear. This will likely fade after the  baby is born. WHAT TO EXPECT AT YOUR PRENATAL VISITS During a routine prenatal visit:  You will be weighed to make sure you and the fetus are growing normally.  Your blood pressure will be taken.  Your abdomen will be measured to track your baby's growth.  The fetal heartbeat will be listened to.  Any test results from the previous visit will be discussed. Your caregiver may ask you:  How you are feeling.  If you are feeling the baby move.  If you have had any abnormal symptoms, such as leaking fluid, bleeding, severe headaches, or abdominal cramping.  If you have any questions. Other tests that may be performed during your second trimester include:  Blood tests that check for:  Low iron levels (anemia).  Gestational diabetes (between 24 and 28 weeks).  Rh antibodies.  Urine tests to check for infections, diabetes, or protein in the urine.  An ultrasound to confirm the proper growth and development of the baby.  An amniocentesis to check for possible genetic problems.  Fetal screens for spina bifida and Down syndrome. HOME CARE INSTRUCTIONS   Avoid all smoking, herbs, alcohol, and unprescribed drugs. These chemicals affect the formation and growth of the baby.  Follow your caregiver's instructions regarding medicine use. There are medicines that are either safe or unsafe to take during pregnancy.  Exercise only as directed by your caregiver. Experiencing uterine cramps is a good sign to stop exercising.  Continue to eat regular,   healthy meals.  Wear a good support bra for breast tenderness.  Do not use hot tubs, steam rooms, or saunas.  Wear your seat belt at all times when driving.  Avoid raw meat, uncooked cheese, cat litter boxes, and soil used by cats. These carry germs that can cause birth defects in the baby.  Take your prenatal vitamins.  Try taking a stool softener (if your caregiver approves) if you develop constipation. Eat more high-fiber foods,  such as fresh vegetables or fruit and whole grains. Drink plenty of fluids to keep your urine clear or pale yellow.  Take warm sitz baths to soothe any pain or discomfort caused by hemorrhoids. Use hemorrhoid cream if your caregiver approves.  If you develop varicose veins, wear support hose. Elevate your feet for 15 minutes, 3 4 times a day. Limit salt in your diet.  Avoid heavy lifting, wear low heel shoes, and practice good posture.  Rest with your legs elevated if you have leg cramps or low back pain.  Visit your dentist if you have not gone yet during your pregnancy. Use a soft toothbrush to brush your teeth and be gentle when you floss.  A sexual relationship may be continued unless your caregiver directs you otherwise.  Continue to go to all your prenatal visits as directed by your caregiver. SEEK MEDICAL CARE IF:   You have dizziness.  You have mild pelvic cramps, pelvic pressure, or nagging pain in the abdominal area.  You have persistent nausea, vomiting, or diarrhea.  You have a bad smelling vaginal discharge.  You have pain with urination. SEEK IMMEDIATE MEDICAL CARE IF:   You have a fever.  You are leaking fluid from your vagina.  You have spotting or bleeding from your vagina.  You have severe abdominal cramping or pain.  You have rapid weight gain or loss.  You have shortness of breath with chest pain.  You notice sudden or extreme swelling of your face, hands, ankles, feet, or legs.  You have not felt your baby move in over an hour.  You have severe headaches that do not go away with medicine.  You have vision changes. Document Released: 11/12/2001 Document Revised: 07/21/2013 Document Reviewed: 01/19/2013 [employer] Patient Information 2014 [employer].  

## 2014-02-02 NOTE — Progress Notes (Signed)
S: 23 yo G1 @ 2250w3d here for ROBV - +FM - no ctx, lof, vb  O: see flowsheet   A/P - doing well - reviewed quad screen results - pt states quit THC 2/4- wants repeat UDS today. Ordered - f/u in 4 weeks - PTL precautions discussed.

## 2014-02-02 NOTE — Progress Notes (Signed)
P= 91 Pt. States she does not want to get flu today but will get it at next visit.

## 2014-02-03 LAB — PRESCRIPTION MONITORING PROFILE (SOLSTAS)
AMPHETAMINE/METH: NEGATIVE ng/mL
BUPRENORPHINE, URINE: NEGATIVE ng/mL
Barbiturate Screen, Urine: NEGATIVE ng/mL
Benzodiazepine Screen, Urine: NEGATIVE ng/mL
Cannabinoid Scrn, Ur: NEGATIVE ng/mL
Carisoprodol, Urine: NEGATIVE ng/mL
Cocaine Metabolites: NEGATIVE ng/mL
Creatinine, Urine: 434.37 mg/dL (ref >20.0)
FENTANYL URINE: NEGATIVE ng/mL
MDMA URINE: NEGATIVE ng/mL
Meperidine, Ur: NEGATIVE ng/mL
Methadone Screen, Urine: NEGATIVE ng/mL
NITRITES URINE, INITIAL: NEGATIVE ug/mL
Opiate Screen, Urine: NEGATIVE ng/mL
Oxycodone Screen, Ur: NEGATIVE ng/mL
PROPOXYPHENE: NEGATIVE ng/mL
TAPENTADOLUR: NEGATIVE ng/mL
Tramadol Scrn, Ur: NEGATIVE ng/mL
ZOLPIDEM, URINE: NEGATIVE ng/mL
pH, Initial: 6.7 pH (ref 4.5–8.9)

## 2014-03-02 ENCOUNTER — Ambulatory Visit (INDEPENDENT_AMBULATORY_CARE_PROVIDER_SITE_OTHER): Payer: Medicaid Other | Admitting: Advanced Practice Midwife

## 2014-03-02 VITALS — BP 125/77 | Wt 155.3 lb

## 2014-03-02 DIAGNOSIS — O35BXX0 Maternal care for other (suspected) fetal abnormality and damage, fetal cardiac anomalies, not applicable or unspecified: Secondary | ICD-10-CM

## 2014-03-02 DIAGNOSIS — O358XX0 Maternal care for other (suspected) fetal abnormality and damage, not applicable or unspecified: Secondary | ICD-10-CM

## 2014-03-02 DIAGNOSIS — Z23 Encounter for immunization: Secondary | ICD-10-CM

## 2014-03-02 LAB — POCT URINALYSIS DIP (DEVICE)
BILIRUBIN URINE: NEGATIVE
Glucose, UA: NEGATIVE mg/dL
Hgb urine dipstick: NEGATIVE
KETONES UR: NEGATIVE mg/dL
Nitrite: NEGATIVE
Protein, ur: NEGATIVE mg/dL
Specific Gravity, Urine: 1.025 (ref 1.005–1.030)
Urobilinogen, UA: 0.2 mg/dL (ref 0.0–1.0)
pH: 6.5 (ref 5.0–8.0)

## 2014-03-02 LAB — CBC
HEMATOCRIT: 33.2 % — AB (ref 36.0–46.0)
HEMOGLOBIN: 11 g/dL — AB (ref 12.0–15.0)
MCH: 30.1 pg (ref 26.0–34.0)
MCHC: 33.1 g/dL (ref 30.0–36.0)
MCV: 91 fL (ref 78.0–100.0)
Platelets: 236 10*3/uL (ref 150–400)
RBC: 3.65 MIL/uL — ABNORMAL LOW (ref 3.87–5.11)
RDW: 13.4 % (ref 11.5–15.5)
WBC: 11.5 10*3/uL — ABNORMAL HIGH (ref 4.0–10.5)

## 2014-03-02 MED ORDER — TETANUS-DIPHTH-ACELL PERTUSSIS 5-2.5-18.5 LF-MCG/0.5 IM SUSP: 0.5000 mL | Freq: Once | INTRAMUSCULAR | Status: DC

## 2014-03-02 NOTE — Progress Notes (Signed)
Pulse 103 28 week labs today. 1hr due at 1118 Pt would like tdap

## 2014-03-02 NOTE — Progress Notes (Signed)
GTT today. Patient desires Tdap. No concerns.

## 2014-03-02 NOTE — Patient Instructions (Signed)
Third Trimester of Pregnancy  The third trimester is from week 29 through week 42, months 7 through 9. The third trimester is a time when the fetus is growing rapidly. At the end of the ninth month, the fetus is about 20 inches in length and weighs 6 10 pounds.   BODY CHANGES  Your body goes through many changes during pregnancy. The changes vary from woman to woman.    Your weight will continue to increase. You can expect to gain 25 35 pounds (11 16 kg) by the end of the pregnancy.   You may begin to get stretch marks on your hips, abdomen, and breasts.   You may urinate more often because the fetus is moving lower into your pelvis and pressing on your bladder.   You may develop or continue to have heartburn as a result of your pregnancy.   You may develop constipation because certain hormones are causing the muscles that push waste through your intestines to slow down.   You may develop hemorrhoids or swollen, bulging veins (varicose veins).   You may have pelvic pain because of the weight gain and pregnancy hormones relaxing your joints between the bones in your pelvis. Back aches may result from over exertion of the muscles supporting your posture.   Your breasts will continue to grow and be tender. A yellow discharge may leak from your breasts called colostrum.   Your belly button may stick out.   You may feel short of breath because of your expanding uterus.   You may notice the fetus "dropping," or moving lower in your abdomen.   You may have a bloody mucus discharge. This usually occurs a few days to a week before labor begins.   Your cervix becomes thin and soft (effaced) near your due date.  WHAT TO EXPECT AT YOUR PRENATAL EXAMS   You will have prenatal exams every 2 weeks until week 36. Then, you will have weekly prenatal exams. During a routine prenatal visit:   You will be weighed to make sure you and the fetus are growing normally.   Your blood pressure is taken.   Your abdomen will be  measured to track your baby's growth.   The fetal heartbeat will be listened to.   Any test results from the previous visit will be discussed.   You may have a cervical check near your due date to see if you have effaced.  At around 36 weeks, your caregiver will check your cervix. At the same time, your caregiver will also perform a test on the secretions of the vaginal tissue. This test is to determine if a type of bacteria, Group B streptococcus, is present. Your caregiver will explain this further.  Your caregiver may ask you:   What your birth plan is.   How you are feeling.   If you are feeling the baby move.   If you have had any abnormal symptoms, such as leaking fluid, bleeding, severe headaches, or abdominal cramping.   If you have any questions.  Other tests or screenings that may be performed during your third trimester include:   Blood tests that check for low iron levels (anemia).   Fetal testing to check the health, activity level, and growth of the fetus. Testing is done if you have certain medical conditions or if there are problems during the pregnancy.  FALSE LABOR  You may feel small, irregular contractions that eventually go away. These are called Braxton Hicks contractions, or   false labor. Contractions may last for hours, days, or even weeks before true labor sets in. If contractions come at regular intervals, intensify, or become painful, it is best to be seen by your caregiver.   SIGNS OF LABOR    Menstrual-like cramps.   Contractions that are 5 minutes apart or less.   Contractions that start on the top of the uterus and spread down to the lower abdomen and back.   A sense of increased pelvic pressure or back pain.   A watery or bloody mucus discharge that comes from the vagina.  If you have any of these signs before the 37th week of pregnancy, call your caregiver right away. You need to go to the hospital to get checked immediately.  HOME CARE INSTRUCTIONS    Avoid all  smoking, herbs, alcohol, and unprescribed drugs. These chemicals affect the formation and growth of the baby.   Follow your caregiver's instructions regarding medicine use. There are medicines that are either safe or unsafe to take during pregnancy.   Exercise only as directed by your caregiver. Experiencing uterine cramps is a good sign to stop exercising.   Continue to eat regular, healthy meals.   Wear a good support bra for breast tenderness.   Do not use hot tubs, steam rooms, or saunas.   Wear your seat belt at all times when driving.   Avoid raw meat, uncooked cheese, cat litter boxes, and soil used by cats. These carry germs that can cause birth defects in the baby.   Take your prenatal vitamins.   Try taking a stool softener (if your caregiver approves) if you develop constipation. Eat more high-fiber foods, such as fresh vegetables or fruit and whole grains. Drink plenty of fluids to keep your urine clear or pale yellow.   Take warm sitz baths to soothe any pain or discomfort caused by hemorrhoids. Use hemorrhoid cream if your caregiver approves.   If you develop varicose veins, wear support hose. Elevate your feet for 15 minutes, 3 4 times a day. Limit salt in your diet.   Avoid heavy lifting, wear low heal shoes, and practice good posture.   Rest a lot with your legs elevated if you have leg cramps or low back pain.   Visit your dentist if you have not gone during your pregnancy. Use a soft toothbrush to brush your teeth and be gentle when you floss.   A sexual relationship may be continued unless your caregiver directs you otherwise.   Do not travel far distances unless it is absolutely necessary and only with the approval of your caregiver.   Take prenatal classes to understand, practice, and ask questions about the labor and delivery.   Make a trial run to the hospital.   Pack your hospital bag.   Prepare the baby's nursery.   Continue to go to all your prenatal visits as directed  by your caregiver.  SEEK MEDICAL CARE IF:   You are unsure if you are in labor or if your water has broken.   You have dizziness.   You have mild pelvic cramps, pelvic pressure, or nagging pain in your abdominal area.   You have persistent nausea, vomiting, or diarrhea.   You have a bad smelling vaginal discharge.   You have pain with urination.  SEEK IMMEDIATE MEDICAL CARE IF:    You have a fever.   You are leaking fluid from your vagina.   You have spotting or bleeding from your vagina.     You have severe abdominal cramping or pain.   You have rapid weight loss or gain.   You have shortness of breath with chest pain.   You notice sudden or extreme swelling of your face, hands, ankles, feet, or legs.   You have not felt your baby move in over an hour.   You have severe headaches that do not go away with medicine.   You have vision changes.  Document Released: 11/12/2001 Document Revised: 07/21/2013 Document Reviewed: 01/19/2013  [employer] Patient Information 2014 [employer].

## 2014-03-03 LAB — GLUCOSE TOLERANCE, 1 HOUR (50G) W/O FASTING: GLUCOSE 1 HOUR GTT: 90 mg/dL (ref 70–140)

## 2014-03-03 LAB — RPR

## 2014-03-03 LAB — HIV ANTIBODY (ROUTINE TESTING W REFLEX): HIV: NONREACTIVE

## 2014-03-08 ENCOUNTER — Telehealth: Payer: Self-pay | Admitting: *Deleted

## 2014-03-08 NOTE — Telephone Encounter (Signed)
Audrey Schultz states she was looking at my chart and saw a result for her urine- that meant white blood cells- wants to know if that is normal

## 2014-03-08 NOTE — Telephone Encounter (Signed)
[place]. She had trace leukocytes- I explained to her that is considered negative and does not need to be treated. We also discussed that we will continue to check her urine each visit to see if there are any concerns. Chelsei voiced understanding.

## 2014-03-16 ENCOUNTER — Encounter: Payer: Self-pay | Admitting: Obstetrics and Gynecology

## 2014-03-16 ENCOUNTER — Ambulatory Visit (INDEPENDENT_AMBULATORY_CARE_PROVIDER_SITE_OTHER): Payer: Medicaid Other | Admitting: Obstetrics and Gynecology

## 2014-03-16 VITALS — BP 120/74 | Temp 98.2°F | Wt 155.1 lb

## 2014-03-16 DIAGNOSIS — O99320 Drug use complicating pregnancy, unspecified trimester: Secondary | ICD-10-CM

## 2014-03-16 DIAGNOSIS — O35BXX0 Maternal care for other (suspected) fetal abnormality and damage, fetal cardiac anomalies, not applicable or unspecified: Secondary | ICD-10-CM

## 2014-03-16 DIAGNOSIS — F1920 Other psychoactive substance dependence, uncomplicated: Secondary | ICD-10-CM

## 2014-03-16 DIAGNOSIS — O358XX0 Maternal care for other (suspected) fetal abnormality and damage, not applicable or unspecified: Secondary | ICD-10-CM

## 2014-03-16 LAB — POCT URINALYSIS DIP (DEVICE)
Bilirubin Urine: NEGATIVE
GLUCOSE, UA: NEGATIVE mg/dL
Hgb urine dipstick: NEGATIVE
KETONES UR: NEGATIVE mg/dL
Leukocytes, UA: NEGATIVE
Nitrite: NEGATIVE
Protein, ur: NEGATIVE mg/dL
SPECIFIC GRAVITY, URINE: 1.02 (ref 1.005–1.030)
Urobilinogen, UA: 0.2 mg/dL (ref 0.0–1.0)
pH: 6.5 (ref 5.0–8.0)

## 2014-03-16 NOTE — Progress Notes (Signed)
Pulse: 100

## 2014-03-16 NOTE — Progress Notes (Signed)
F/U US scheduled. Hx. mj use but has quit. Good FM. Reviewed all lab and US results

## 2014-03-16 NOTE — Patient Instructions (Signed)
Third Trimester of Pregnancy  The third trimester is from week 29 through week 42, months 7 through 9. The third trimester is a time when the fetus is growing rapidly. At the end of the ninth month, the fetus is about 20 inches in length and weighs 6 10 pounds.   BODY CHANGES  Your body goes through many changes during pregnancy. The changes vary from woman to woman.    Your weight will continue to increase. You can expect to gain 25 35 pounds (11 16 kg) by the end of the pregnancy.   You may begin to get stretch marks on your hips, abdomen, and breasts.   You may urinate more often because the fetus is moving lower into your pelvis and pressing on your bladder.   You may develop or continue to have heartburn as a result of your pregnancy.   You may develop constipation because certain hormones are causing the muscles that push waste through your intestines to slow down.   You may develop hemorrhoids or swollen, bulging veins (varicose veins).   You may have pelvic pain because of the weight gain and pregnancy hormones relaxing your joints between the bones in your pelvis. Back aches may result from over exertion of the muscles supporting your posture.   Your breasts will continue to grow and be tender. A yellow discharge may leak from your breasts called colostrum.   Your belly button may stick out.   You may feel short of breath because of your expanding uterus.   You may notice the fetus "dropping," or moving lower in your abdomen.   You may have a bloody mucus discharge. This usually occurs a few days to a week before labor begins.   Your cervix becomes thin and soft (effaced) near your due date.  WHAT TO EXPECT AT YOUR PRENATAL EXAMS   You will have prenatal exams every 2 weeks until week 36. Then, you will have weekly prenatal exams. During a routine prenatal visit:   You will be weighed to make sure you and the fetus are growing normally.   Your blood pressure is taken.   Your abdomen will be  measured to track your baby's growth.   The fetal heartbeat will be listened to.   Any test results from the previous visit will be discussed.   You may have a cervical check near your due date to see if you have effaced.  At around 36 weeks, your caregiver will check your cervix. At the same time, your caregiver will also perform a test on the secretions of the vaginal tissue. This test is to determine if a type of bacteria, Group B streptococcus, is present. Your caregiver will explain this further.  Your caregiver may ask you:   What your birth plan is.   How you are feeling.   If you are feeling the baby move.   If you have had any abnormal symptoms, such as leaking fluid, bleeding, severe headaches, or abdominal cramping.   If you have any questions.  Other tests or screenings that may be performed during your third trimester include:   Blood tests that check for low iron levels (anemia).   Fetal testing to check the health, activity level, and growth of the fetus. Testing is done if you have certain medical conditions or if there are problems during the pregnancy.  FALSE LABOR  You may feel small, irregular contractions that eventually go away. These are called Braxton Hicks contractions, or   false labor. Contractions may last for hours, days, or even weeks before true labor sets in. If contractions come at regular intervals, intensify, or become painful, it is best to be seen by your caregiver.   SIGNS OF LABOR    Menstrual-like cramps.   Contractions that are 5 minutes apart or less.   Contractions that start on the top of the uterus and spread down to the lower abdomen and back.   A sense of increased pelvic pressure or back pain.   A watery or bloody mucus discharge that comes from the vagina.  If you have any of these signs before the 37th week of pregnancy, call your caregiver right away. You need to go to the hospital to get checked immediately.  HOME CARE INSTRUCTIONS    Avoid all  smoking, herbs, alcohol, and unprescribed drugs. These chemicals affect the formation and growth of the baby.   Follow your caregiver's instructions regarding medicine use. There are medicines that are either safe or unsafe to take during pregnancy.   Exercise only as directed by your caregiver. Experiencing uterine cramps is a good sign to stop exercising.   Continue to eat regular, healthy meals.   Wear a good support bra for breast tenderness.   Do not use hot tubs, steam rooms, or saunas.   Wear your seat belt at all times when driving.   Avoid raw meat, uncooked cheese, cat litter boxes, and soil used by cats. These carry germs that can cause birth defects in the baby.   Take your prenatal vitamins.   Try taking a stool softener (if your caregiver approves) if you develop constipation. Eat more high-fiber foods, such as fresh vegetables or fruit and whole grains. Drink plenty of fluids to keep your urine clear or pale yellow.   Take warm sitz baths to soothe any pain or discomfort caused by hemorrhoids. Use hemorrhoid cream if your caregiver approves.   If you develop varicose veins, wear support hose. Elevate your feet for 15 minutes, 3 4 times a day. Limit salt in your diet.   Avoid heavy lifting, wear low heal shoes, and practice good posture.   Rest a lot with your legs elevated if you have leg cramps or low back pain.   Visit your dentist if you have not gone during your pregnancy. Use a soft toothbrush to brush your teeth and be gentle when you floss.   A sexual relationship may be continued unless your caregiver directs you otherwise.   Do not travel far distances unless it is absolutely necessary and only with the approval of your caregiver.   Take prenatal classes to understand, practice, and ask questions about the labor and delivery.   Make a trial run to the hospital.   Pack your hospital bag.   Prepare the baby's nursery.   Continue to go to all your prenatal visits as directed  by your caregiver.  SEEK MEDICAL CARE IF:   You are unsure if you are in labor or if your water has broken.   You have dizziness.   You have mild pelvic cramps, pelvic pressure, or nagging pain in your abdominal area.   You have persistent nausea, vomiting, or diarrhea.   You have a bad smelling vaginal discharge.   You have pain with urination.  SEEK IMMEDIATE MEDICAL CARE IF:    You have a fever.   You are leaking fluid from your vagina.   You have spotting or bleeding from your vagina.     You have severe abdominal cramping or pain.   You have rapid weight loss or gain.   You have shortness of breath with chest pain.   You notice sudden or extreme swelling of your face, hands, ankles, feet, or legs.   You have not felt your baby move in over an hour.   You have severe headaches that do not go away with medicine.   You have vision changes.  Document Released: 11/12/2001 Document Revised: 07/21/2013 Document Reviewed: 01/19/2013  [employer] Patient Information 2014 [employer].

## 2014-03-18 LAB — URINE CULTURE: Colony Count: 35000

## 2014-03-21 LAB — PRESCRIPTION MONITORING PROFILE (19 PANEL)
AMPHETAMINE/METH: NEGATIVE ng/mL
BARBITURATE SCREEN, URINE: NEGATIVE ng/mL
BENZODIAZEPINE SCREEN, URINE: NEGATIVE ng/mL
Buprenorphine, Urine: NEGATIVE ng/mL
CREATININE, URINE: 261.71 mg/dL (ref >20.0)
Carisoprodol, Urine: NEGATIVE ng/mL
Cocaine Metabolites: NEGATIVE ng/mL
Fentanyl, Ur: NEGATIVE ng/mL
MDMA URINE: NEGATIVE ng/mL
Meperidine, Ur: NEGATIVE ng/mL
Methadone Screen, Urine: NEGATIVE ng/mL
Methaqualone: NEGATIVE ng/mL
Nitrites, Initial: NEGATIVE ug/mL
OPIATE SCREEN, URINE: NEGATIVE ng/mL
OXYCODONE SCRN UR: NEGATIVE ng/mL
PH URINE, INITIAL: 6.5 pH (ref 4.5–8.9)
PROPOXYPHENE: NEGATIVE ng/mL
Phencyclidine, Ur: NEGATIVE ng/mL
TAPENTADOLUR: NEGATIVE ng/mL
TRAMADOL UR: NEGATIVE ng/mL
ZOLPIDEM, URINE: NEGATIVE ng/mL

## 2014-03-21 LAB — CANNABANOIDS (GC/LC/MS), URINE: THC-COOH UR CONFIRM: 492 ng/mL — AB

## 2014-03-23 ENCOUNTER — Ambulatory Visit (HOSPITAL_COMMUNITY)
Admission: RE | Admit: 2014-03-23 | Discharge: 2014-03-23 | Disposition: A | Discharge status: Home / Self Care | Payer: Medicaid Other | Source: Ambulatory Visit | Attending: Obstetrics and Gynecology | Admitting: Obstetrics and Gynecology

## 2014-03-23 DIAGNOSIS — O358XX0 Maternal care for other (suspected) fetal abnormality and damage, not applicable or unspecified: Secondary | ICD-10-CM | POA: Insufficient documentation

## 2014-03-23 DIAGNOSIS — Z3689 Encounter for other specified antenatal screening: Secondary | ICD-10-CM | POA: Insufficient documentation

## 2014-03-23 DIAGNOSIS — Z3490 Encounter for supervision of normal pregnancy, unspecified, unspecified trimester: Secondary | ICD-10-CM

## 2014-03-23 DIAGNOSIS — O35BXX0 Maternal care for other (suspected) fetal abnormality and damage, fetal cardiac anomalies, not applicable or unspecified: Secondary | ICD-10-CM

## 2014-03-24 ENCOUNTER — Telehealth: Payer: Self-pay | Admitting: *Deleted

## 2014-03-24 NOTE — Telephone Encounter (Signed)
Pt left a message stating that she has a question about her urine culture results. I returned the call and informed her that she does not have a bladder infection.  Pt voiced understanding.

## 2014-03-30 ENCOUNTER — Ambulatory Visit (INDEPENDENT_AMBULATORY_CARE_PROVIDER_SITE_OTHER): Payer: Medicaid Other | Admitting: Obstetrics and Gynecology

## 2014-03-30 ENCOUNTER — Encounter: Payer: Self-pay | Admitting: Obstetrics and Gynecology

## 2014-03-30 VITALS — BP 111/74 | HR 101 | Temp 97.7°F | Wt 156.0 lb

## 2014-03-30 DIAGNOSIS — O99340 Other mental disorders complicating pregnancy, unspecified trimester: Secondary | ICD-10-CM

## 2014-03-30 DIAGNOSIS — Z3490 Encounter for supervision of normal pregnancy, unspecified, unspecified trimester: Secondary | ICD-10-CM

## 2014-03-30 DIAGNOSIS — O99320 Drug use complicating pregnancy, unspecified trimester: Secondary | ICD-10-CM

## 2014-03-30 DIAGNOSIS — F1920 Other psychoactive substance dependence, uncomplicated: Secondary | ICD-10-CM

## 2014-03-30 LAB — POCT URINALYSIS DIP (DEVICE)
Bilirubin Urine: NEGATIVE
Glucose, UA: NEGATIVE mg/dL
Hgb urine dipstick: NEGATIVE
Ketones, ur: NEGATIVE mg/dL
Nitrite: NEGATIVE
PROTEIN: NEGATIVE mg/dL
Specific Gravity, Urine: 1.015 (ref 1.005–1.030)
Urobilinogen, UA: 0.2 mg/dL (ref 0.0–1.0)
pH: 7 (ref 5.0–8.0)

## 2014-03-30 NOTE — Patient Instructions (Signed)
Third Trimester of Pregnancy  The third trimester is from week 29 through week 42, months 7 through 9. The third trimester is a time when the fetus is growing rapidly. At the end of the ninth month, the fetus is about 20 inches in length and weighs 6 10 pounds.   BODY CHANGES  Your body goes through many changes during pregnancy. The changes vary from woman to woman.    Your weight will continue to increase. You can expect to gain 25 35 pounds (11 16 kg) by the end of the pregnancy.   You may begin to get stretch marks on your hips, abdomen, and breasts.   You may urinate more often because the fetus is moving lower into your pelvis and pressing on your bladder.   You may develop or continue to have heartburn as a result of your pregnancy.   You may develop constipation because certain hormones are causing the muscles that push waste through your intestines to slow down.   You may develop hemorrhoids or swollen, bulging veins (varicose veins).   You may have pelvic pain because of the weight gain and pregnancy hormones relaxing your joints between the bones in your pelvis. Back aches may result from over exertion of the muscles supporting your posture.   Your breasts will continue to grow and be tender. A yellow discharge may leak from your breasts called colostrum.   Your belly button may stick out.   You may feel short of breath because of your expanding uterus.   You may notice the fetus "dropping," or moving lower in your abdomen.   You may have a bloody mucus discharge. This usually occurs a few days to a week before labor begins.   Your cervix becomes thin and soft (effaced) near your due date.  WHAT TO EXPECT AT YOUR PRENATAL EXAMS   You will have prenatal exams every 2 weeks until week 36. Then, you will have weekly prenatal exams. During a routine prenatal visit:   You will be weighed to make sure you and the fetus are growing normally.   Your blood pressure is taken.   Your abdomen will be  measured to track your baby's growth.   The fetal heartbeat will be listened to.   Any test results from the previous visit will be discussed.   You may have a cervical check near your due date to see if you have effaced.  At around 36 weeks, your caregiver will check your cervix. At the same time, your caregiver will also perform a test on the secretions of the vaginal tissue. This test is to determine if a type of bacteria, Group B streptococcus, is present. Your caregiver will explain this further.  Your caregiver may ask you:   What your birth plan is.   How you are feeling.   If you are feeling the baby move.   If you have had any abnormal symptoms, such as leaking fluid, bleeding, severe headaches, or abdominal cramping.   If you have any questions.  Other tests or screenings that may be performed during your third trimester include:   Blood tests that check for low iron levels (anemia).   Fetal testing to check the health, activity level, and growth of the fetus. Testing is done if you have certain medical conditions or if there are problems during the pregnancy.  FALSE LABOR  You may feel small, irregular contractions that eventually go away. These are called Braxton Hicks contractions, or   false labor. Contractions may last for hours, days, or even weeks before true labor sets in. If contractions come at regular intervals, intensify, or become painful, it is best to be seen by your caregiver.   SIGNS OF LABOR    Menstrual-like cramps.   Contractions that are 5 minutes apart or less.   Contractions that start on the top of the uterus and spread down to the lower abdomen and back.   A sense of increased pelvic pressure or back pain.   A watery or bloody mucus discharge that comes from the vagina.  If you have any of these signs before the 37th week of pregnancy, call your caregiver right away. You need to go to the hospital to get checked immediately.  HOME CARE INSTRUCTIONS    Avoid all  smoking, herbs, alcohol, and unprescribed drugs. These chemicals affect the formation and growth of the baby.   Follow your caregiver's instructions regarding medicine use. There are medicines that are either safe or unsafe to take during pregnancy.   Exercise only as directed by your caregiver. Experiencing uterine cramps is a good sign to stop exercising.   Continue to eat regular, healthy meals.   Wear a good support bra for breast tenderness.   Do not use hot tubs, steam rooms, or saunas.   Wear your seat belt at all times when driving.   Avoid raw meat, uncooked cheese, cat litter boxes, and soil used by cats. These carry germs that can cause birth defects in the baby.   Take your prenatal vitamins.   Try taking a stool softener (if your caregiver approves) if you develop constipation. Eat more high-fiber foods, such as fresh vegetables or fruit and whole grains. Drink plenty of fluids to keep your urine clear or pale yellow.   Take warm sitz baths to soothe any pain or discomfort caused by hemorrhoids. Use hemorrhoid cream if your caregiver approves.   If you develop varicose veins, wear support hose. Elevate your feet for 15 minutes, 3 4 times a day. Limit salt in your diet.   Avoid heavy lifting, wear low heal shoes, and practice good posture.   Rest a lot with your legs elevated if you have leg cramps or low back pain.   Visit your dentist if you have not gone during your pregnancy. Use a soft toothbrush to brush your teeth and be gentle when you floss.   A sexual relationship may be continued unless your caregiver directs you otherwise.   Do not travel far distances unless it is absolutely necessary and only with the approval of your caregiver.   Take prenatal classes to understand, practice, and ask questions about the labor and delivery.   Make a trial run to the hospital.   Pack your hospital bag.   Prepare the baby's nursery.   Continue to go to all your prenatal visits as directed  by your caregiver.  SEEK MEDICAL CARE IF:   You are unsure if you are in labor or if your water has broken.   You have dizziness.   You have mild pelvic cramps, pelvic pressure, or nagging pain in your abdominal area.   You have persistent nausea, vomiting, or diarrhea.   You have a bad smelling vaginal discharge.   You have pain with urination.  SEEK IMMEDIATE MEDICAL CARE IF:    You have a fever.   You are leaking fluid from your vagina.   You have spotting or bleeding from your vagina.     You have severe abdominal cramping or pain.   You have rapid weight loss or gain.   You have shortness of breath with chest pain.   You notice sudden or extreme swelling of your face, hands, ankles, feet, or legs.   You have not felt your baby move in over an hour.   You have severe headaches that do not go away with medicine.   You have vision changes.  Document Released: 11/12/2001 Document Revised: 07/21/2013 Document Reviewed: 01/19/2013  [employer] Patient Information 2014 [employer].

## 2014-03-30 NOTE — Addendum Note (Signed)
Addended by: Caren GriffinsPOE, Chrishawna Farina C on: 03/30/2014 11:31 AM   Modules accepted: Orders

## 2014-03-30 NOTE — Progress Notes (Signed)
UDS + for THC last visit. States "slipped up" one time to improve appetite. Per POC will get serial growth scans. At 31.3wks, EFW at 41st%ile.  Good FM, no upper abd pain, LOF or VB.  RLP and abd strengthening discussed.

## 2014-03-30 NOTE — Progress Notes (Signed)
Pt reports Braxton Hick's contractions with lower back pain.

## 2014-04-15 ENCOUNTER — Encounter (HOSPITAL_COMMUNITY): Payer: Self-pay | Admitting: Family

## 2014-04-15 ENCOUNTER — Inpatient Hospital Stay (HOSPITAL_COMMUNITY)
Admission: AD | Admit: 2014-04-15 | Discharge: 2014-04-15 | Disposition: A | Discharge status: Home / Self Care | Payer: Medicaid Other | Source: Ambulatory Visit | Attending: Obstetrics and Gynecology | Admitting: Obstetrics and Gynecology

## 2014-04-15 DIAGNOSIS — Z87891 Personal history of nicotine dependence: Secondary | ICD-10-CM | POA: Insufficient documentation

## 2014-04-15 DIAGNOSIS — N760 Acute vaginitis: Secondary | ICD-10-CM

## 2014-04-15 DIAGNOSIS — B9689 Other specified bacterial agents as the cause of diseases classified elsewhere: Secondary | ICD-10-CM

## 2014-04-15 DIAGNOSIS — O9989 Other specified diseases and conditions complicating pregnancy, childbirth and the puerperium: Principal | ICD-10-CM

## 2014-04-15 DIAGNOSIS — N898 Other specified noninflammatory disorders of vagina: Secondary | ICD-10-CM

## 2014-04-15 DIAGNOSIS — O26899 Other specified pregnancy related conditions, unspecified trimester: Secondary | ICD-10-CM

## 2014-04-15 DIAGNOSIS — O99891 Other specified diseases and conditions complicating pregnancy: Secondary | ICD-10-CM | POA: Insufficient documentation

## 2014-04-15 DIAGNOSIS — Z8249 Family history of ischemic heart disease and other diseases of the circulatory system: Secondary | ICD-10-CM | POA: Insufficient documentation

## 2014-04-15 LAB — WET PREP, GENITAL
Trich, Wet Prep: NONE SEEN
Yeast Wet Prep HPF POC: NONE SEEN

## 2014-04-15 MED ORDER — METRONIDAZOLE 500 MG PO TABS: 500.0000 mg | ORAL_TABLET | Freq: Two times a day (BID) | ORAL | Status: AC

## 2014-04-15 NOTE — Discharge Instructions (Signed)
Third Trimester of Pregnancy °The third trimester is from week 29 through week 42, months 7 through 9. The third trimester is a time when the fetus is growing rapidly. At the end of the ninth month, the fetus is about 20 inches in length and weighs 6 10 pounds.  °BODY CHANGES °Your body goes through many changes during pregnancy. The changes vary from woman to woman.  °· Your weight will continue to increase. You can expect to gain 25 35 pounds (11 16 kg) by the end of the pregnancy. °· You may begin to get stretch marks on your hips, abdomen, and breasts. °· You may urinate more often because the fetus is moving lower into your pelvis and pressing on your bladder. °· You may develop or continue to have heartburn as a result of your pregnancy. °· You may develop constipation because certain hormones are causing the muscles that push waste through your intestines to slow down. °· You may develop hemorrhoids or swollen, bulging veins (varicose veins). °· You may have pelvic pain because of the weight gain and pregnancy hormones relaxing your joints between the bones in your pelvis. Back aches may result from over exertion of the muscles supporting your posture. °· Your breasts will continue to grow and be tender. A yellow discharge may leak from your breasts called colostrum. °· Your belly button may stick out. °· You may feel short of breath because of your expanding uterus. °· You may notice the fetus "dropping," or moving lower in your abdomen. °· You may have a bloody mucus discharge. This usually occurs a few days to a week before labor begins. °· Your cervix becomes thin and soft (effaced) near your due date. °WHAT TO EXPECT AT YOUR PRENATAL EXAMS  °You will have prenatal exams every 2 weeks until week 36. Then, you will have weekly prenatal exams. During a routine prenatal visit: °· You will be weighed to make sure you and the fetus are growing normally. °· Your blood pressure is taken. °· Your abdomen will be  measured to track your baby's growth. °· The fetal heartbeat will be listened to. °· Any test results from the previous visit will be discussed. °· You may have a cervical check near your due date to see if you have effaced. °At around 36 weeks, your caregiver will check your cervix. At the same time, your caregiver will also perform a test on the secretions of the vaginal tissue. This test is to determine if a type of bacteria, Group B streptococcus, is present. Your caregiver will explain this further. °Your caregiver may ask you: °· What your birth plan is. °· How you are feeling. °· If you are feeling the baby move. °· If you have had any abnormal symptoms, such as leaking fluid, bleeding, severe headaches, or abdominal cramping. °· If you have any questions. °Other tests or screenings that may be performed during your third trimester include: °· Blood tests that check for low iron levels (anemia). °· Fetal testing to check the health, activity level, and growth of the fetus. Testing is done if you have certain medical conditions or if there are problems during the pregnancy. °FALSE LABOR °You may feel small, irregular contractions that eventually go away. These are called Braxton Hicks contractions, or false labor. Contractions may last for hours, days, or even weeks before true labor sets in. If contractions come at regular intervals, intensify, or become painful, it is best to be seen by your caregiver.  °  SIGNS OF LABOR  °· Menstrual-like cramps. °· Contractions that are 5 minutes apart or less. °· Contractions that start on the top of the uterus and spread down to the lower abdomen and back. °· A sense of increased pelvic pressure or back pain. °· A watery or bloody mucus discharge that comes from the vagina. °If you have any of these signs before the 37th week of pregnancy, call your caregiver right away. You need to go to the hospital to get checked immediately. °HOME CARE INSTRUCTIONS  °· Avoid all  smoking, herbs, alcohol, and unprescribed drugs. These chemicals affect the formation and growth of the baby. °· Follow your caregiver's instructions regarding medicine use. There are medicines that are either safe or unsafe to take during pregnancy. °· Exercise only as directed by your caregiver. Experiencing uterine cramps is a good sign to stop exercising. °· Continue to eat regular, healthy meals. °· Wear a good support bra for breast tenderness. °· Do not use hot tubs, steam rooms, or saunas. °· Wear your seat belt at all times when driving. °· Avoid raw meat, uncooked cheese, cat litter boxes, and soil used by cats. These carry germs that can cause birth defects in the baby. °· Take your prenatal vitamins. °· Try taking a stool softener (if your caregiver approves) if you develop constipation. Eat more high-fiber foods, such as fresh vegetables or fruit and whole grains. Drink plenty of fluids to keep your urine clear or pale yellow. °· Take warm sitz baths to soothe any pain or discomfort caused by hemorrhoids. Use hemorrhoid cream if your caregiver approves. °· If you develop varicose veins, wear support hose. Elevate your feet for 15 minutes, 3 4 times a day. Limit salt in your diet. °· Avoid heavy lifting, wear low heal shoes, and practice good posture. °· Rest a lot with your legs elevated if you have leg cramps or low back pain. °· Visit your dentist if you have not gone during your pregnancy. Use a soft toothbrush to brush your teeth and be gentle when you floss. °· A sexual relationship may be continued unless your caregiver directs you otherwise. °· Do not travel far distances unless it is absolutely necessary and only with the approval of your caregiver. °· Take prenatal classes to understand, practice, and ask questions about the labor and delivery. °· Make a trial run to the hospital. °· Pack your hospital bag. °· Prepare the baby's nursery. °· Continue to go to all your prenatal visits as directed  by your caregiver. °SEEK MEDICAL CARE IF: °· You are unsure if you are in labor or if your water has broken. °· You have dizziness. °· You have mild pelvic cramps, pelvic pressure, or nagging pain in your abdominal area. °· You have persistent nausea, vomiting, or diarrhea. °· You have a bad smelling vaginal discharge. °· You have pain with urination. °SEEK IMMEDIATE MEDICAL CARE IF:  °· You have a fever. °· You are leaking fluid from your vagina. °· You have spotting or bleeding from your vagina. °· You have severe abdominal cramping or pain. °· You have rapid weight loss or gain. °· You have shortness of breath with chest pain. °· You notice sudden or extreme swelling of your face, hands, ankles, feet, or legs. °· You have not felt your baby move in over an hour. °· You have severe headaches that do not go away with medicine. °· You have vision changes. °Document Released: 11/12/2001 Document Revised: 07/21/2013 Document Reviewed:   01/19/2013 °ExitCare® Patient Information ©2014 [employer]. °Bacterial Vaginosis °Bacterial vaginosis is a vaginal infection that occurs when the normal balance of bacteria in the vagina is disrupted. It results from an overgrowth of certain bacteria. This is the most common vaginal infection in women of childbearing age. Treatment is important to prevent complications, especially in pregnant women, as it can cause a premature delivery. °CAUSES  °Bacterial vaginosis is caused by an increase in harmful bacteria that are normally present in smaller amounts in the vagina. Several different kinds of bacteria can cause bacterial vaginosis. However, the reason that the condition develops is not fully understood. °RISK FACTORS °Certain activities or behaviors can put you at an increased risk of developing bacterial vaginosis, including: °· Having a new sex partner or multiple sex partners. °· Douching. °· Using an intrauterine device (IUD) for contraception. °Women do not get bacterial  vaginosis from toilet seats, bedding, swimming pools, or contact with objects around them. °SIGNS AND SYMPTOMS  °Some women with bacterial vaginosis have no signs or symptoms. Common symptoms include: °· Grey vaginal discharge. °· A fishlike odor with discharge, especially after sexual intercourse. °· Itching or burning of the vagina and vulva. °· Burning or pain with urination. °DIAGNOSIS  °Your health care provider will take a medical history and examine the vagina for signs of bacterial vaginosis. A sample of vaginal fluid may be taken. Your health care provider will look at this sample under a microscope to check for bacteria and abnormal cells. A vaginal pH test may also be done.  °TREATMENT  °Bacterial vaginosis may be treated with antibiotic medicines. These may be given in the form of a pill or a vaginal cream. A second round of antibiotics may be prescribed if the condition comes back after treatment.  °HOME CARE INSTRUCTIONS  °· Only take over-the-counter or prescription medicines as directed by your health care provider. °· If antibiotic medicine was prescribed, take it as directed. Make sure you finish it even if you start to feel better. °· Do not have sex until treatment is completed. °· Tell all sexual partners that you have a vaginal infection. They should see their health care provider and be treated if they have problems, such as a mild rash or itching. °· Practice safe sex by using condoms and only having one sex partner. °SEEK MEDICAL CARE IF:  °· Your symptoms are not improving after 3 days of treatment. °· You have increased discharge or pain. °· You have a fever. °MAKE SURE YOU:  °· Understand these instructions. °· Will watch your condition. °· Will get help right away if you are not doing well or get worse. °FOR MORE INFORMATION  °Centers for Disease Control and Prevention, Division of STD Prevention: www.cdc.gov/std °American Sexual Health Association (ASHA): www.ashastd.org  °Document  Released: 11/18/2005 Document Revised: 09/08/2013 Document Reviewed: 06/30/2013 °ExitCare® Patient Information ©2014 [employer]. ° °

## 2014-04-15 NOTE — MAU Note (Signed)
23 yo, G1P0 at 7489w5d, presents to MAU with c/o leaking clear fluid x 2 occurences at 0300 today. Denies VB. Reports +FM.

## 2014-04-15 NOTE — MAU Provider Note (Signed)
Chief Complaint:  No chief complaint on file.   Audrey Schultz is a 23 y.o.  G1P0 with IUP at 7943w5d presenting for ?ROM  States that at 3am she woke up with a large gush of watery discharge.  Wet her leg and made a spot on the couch. Wasn't wearing underwear or pants. Hasn't had any leaking since in the last 16 hours.   +fm. No vb, no ctx.   [facility].     Menstrual History: OB History   Grav Para Term Preterm Abortions TAB SAB Ect Mult Living   1                Patient's last menstrual period was 08/15/2013.      Past Medical History  Diagnosis Date  . Medical history non-contributory     Past Surgical History  Procedure Laterality Date  . No past surgeries      Family History  Problem Relation Age of Onset  . Hypertension Mother   . Diabetes Mother     History  Substance Use Topics  . Smoking status: Former Smoker -- 0.50 packs/day    Types: Cigarettes    Quit date: 09/01/2013  . Smokeless tobacco: Not on file  . Alcohol Use: No      Allergies  Allergen Reactions  . Shellfish Allergy Anaphylaxis    Facility-administered medications prior to admission  Medication Dose Route Frequency Provider Last Rate Last Dose  . Tdap (BOOSTRIX) injection 0.5 mL  0.5 mL Intramuscular Once Tawnya CrookHeather Donovan Hogan, CNM       Prescriptions prior to admission  Medication Sig Dispense Refill  . Prenat w/o A Vit-FeFum-FePo-FA (CONCEPT OB) 130-92.4-1 MG CAPS Take 1 tablet by mouth 1 day or 1 dose.  30 capsule  5  . Prenatal Vit-Fe Fumarate-FA (PRENATAL/FOLIC ACID PO) Take 1 tablet by mouth daily.        Review of Systems - Negative except for what is mentioned in HPI.  Physical Exam  Blood pressure 128/65, pulse 95, temperature 98 F (36.7 C), temperature source Oral, resp. rate 14, last menstrual period 08/15/2013. GENERAL: Well-developed, well-nourished female in no acute distress.  LUNGS: Clear to auscultation bilaterally.  HEART: Regular rate  and rhythm. ABDOMEN: Soft, nontender, nondistended, gravid.  EXTREMITIES: Nontender, no edema, 2+ distal pulses. GU: NEFG, vagina pink and ruggated, minimal discharge in the vault. Normal cervix. Appears closed. No pooling or fern.   FHT:  Baseline rate 125 bpm   Variability moderate  Accelerations present   Decelerations none Contractions: irritability   Labs: No results found for this or any previous visit (from the past 24 hour(s)).  Imaging Studies:  [place] Ob Follow Up  03/23/2014   OBSTETRICAL ULTRASOUND: This exam was performed within a [facility]. The OB US report was generated in the AS system, and faxed to the ordering physician.   This report is also available in [facility] [employer] and in the [facility] PACS. See [facility] Obstetric US report.   Assessment: Audrey Schultz is  23 y.o. G1P0 at 1643w5d presents with ?ROM.  Plan:  -neg fern and pool - very benign exam - cultures sent - wet prep + for BV- will rx flagyl - reassurance given - d/c to home with labor and rom precautions.   FWB- cat I tracing.   Vale HavenKeli L Niobe Dick 5/15/20156:55 PM

## 2014-04-16 LAB — GC/CHLAMYDIA PROBE AMP
CT PROBE, AMP APTIMA: NEGATIVE
GC Probe RNA: NEGATIVE

## 2014-04-17 NOTE — MAU Provider Note (Signed)
Attestation of Attending Supervision of Advanced Practitioner: Evaluation and management procedures were performed by the PA/NP/CNM/OB Fellow under my supervision/collaboration. Chart reviewed and agree with management and plan.  Nitin Mckowen V Tailey Top 04/17/2014 7:05 PM   

## 2014-04-20 ENCOUNTER — Ambulatory Visit (HOSPITAL_COMMUNITY)
Admission: RE | Admit: 2014-04-20 | Discharge: 2014-04-20 | Disposition: A | Discharge status: Home / Self Care | Payer: Medicaid Other | Source: Ambulatory Visit | Attending: Obstetrics and Gynecology | Admitting: Obstetrics and Gynecology

## 2014-04-20 DIAGNOSIS — Z3689 Encounter for other specified antenatal screening: Secondary | ICD-10-CM | POA: Insufficient documentation

## 2014-04-20 DIAGNOSIS — Z3490 Encounter for supervision of normal pregnancy, unspecified, unspecified trimester: Secondary | ICD-10-CM

## 2014-04-22 ENCOUNTER — Ambulatory Visit (INDEPENDENT_AMBULATORY_CARE_PROVIDER_SITE_OTHER): Payer: Medicaid Other | Admitting: Obstetrics and Gynecology

## 2014-04-22 ENCOUNTER — Other Ambulatory Visit: Payer: Medicaid Other

## 2014-04-22 VITALS — BP 126/77 | HR 100 | Temp 97.3°F | Wt 160.2 lb

## 2014-04-22 DIAGNOSIS — O99320 Drug use complicating pregnancy, unspecified trimester: Secondary | ICD-10-CM

## 2014-04-22 DIAGNOSIS — F1920 Other psychoactive substance dependence, uncomplicated: Secondary | ICD-10-CM

## 2014-04-22 DIAGNOSIS — O358XX0 Maternal care for other (suspected) fetal abnormality and damage, not applicable or unspecified: Secondary | ICD-10-CM

## 2014-04-22 DIAGNOSIS — O35BXX0 Maternal care for other (suspected) fetal abnormality and damage, fetal cardiac anomalies, not applicable or unspecified: Secondary | ICD-10-CM

## 2014-04-22 LAB — POCT URINALYSIS DIP (DEVICE)
BILIRUBIN URINE: NEGATIVE
Glucose, UA: NEGATIVE mg/dL
HGB URINE DIPSTICK: NEGATIVE
Ketones, ur: NEGATIVE mg/dL
LEUKOCYTES UA: NEGATIVE
NITRITE: NEGATIVE
Protein, ur: NEGATIVE mg/dL
Specific Gravity, Urine: 1.01 (ref 1.005–1.030)
Urobilinogen, UA: 0.2 mg/dL (ref 0.0–1.0)
pH: 7 (ref 5.0–8.0)

## 2014-04-22 LAB — OB RESULTS CONSOLE GC/CHLAMYDIA
Chlamydia: NEGATIVE
Gonorrhea: NEGATIVE

## 2014-04-22 LAB — OB RESULTS CONSOLE GBS: GBS: NEGATIVE

## 2014-04-22 NOTE — Patient Instructions (Signed)
Third Trimester of Pregnancy  The third trimester is from week 29 through week 42, months 7 through 9. The third trimester is a time when the fetus is growing rapidly. At the end of the ninth month, the fetus is about 20 inches in length and weighs 6 10 pounds.   BODY CHANGES  Your body goes through many changes during pregnancy. The changes vary from woman to woman.    Your weight will continue to increase. You can expect to gain 25 35 pounds (11 16 kg) by the end of the pregnancy.   You may begin to get stretch marks on your hips, abdomen, and breasts.   You may urinate more often because the fetus is moving lower into your pelvis and pressing on your bladder.   You may develop or continue to have heartburn as a result of your pregnancy.   You may develop constipation because certain hormones are causing the muscles that push waste through your intestines to slow down.   You may develop hemorrhoids or swollen, bulging veins (varicose veins).   You may have pelvic pain because of the weight gain and pregnancy hormones relaxing your joints between the bones in your pelvis. Back aches may result from over exertion of the muscles supporting your posture.   Your breasts will continue to grow and be tender. A yellow discharge may leak from your breasts called colostrum.   Your belly button may stick out.   You may feel short of breath because of your expanding uterus.   You may notice the fetus "dropping," or moving lower in your abdomen.   You may have a bloody mucus discharge. This usually occurs a few days to a week before labor begins.   Your cervix becomes thin and soft (effaced) near your due date.  WHAT TO EXPECT AT YOUR PRENATAL EXAMS   You will have prenatal exams every 2 weeks until week 36. Then, you will have weekly prenatal exams. During a routine prenatal visit:   You will be weighed to make sure you and the fetus are growing normally.   Your blood pressure is taken.   Your abdomen will be  measured to track your baby's growth.   The fetal heartbeat will be listened to.   Any test results from the previous visit will be discussed.   You may have a cervical check near your due date to see if you have effaced.  At around 36 weeks, your caregiver will check your cervix. At the same time, your caregiver will also perform a test on the secretions of the vaginal tissue. This test is to determine if a type of bacteria, Group B streptococcus, is present. Your caregiver will explain this further.  Your caregiver may ask you:   What your birth plan is.   How you are feeling.   If you are feeling the baby move.   If you have had any abnormal symptoms, such as leaking fluid, bleeding, severe headaches, or abdominal cramping.   If you have any questions.  Other tests or screenings that may be performed during your third trimester include:   Blood tests that check for low iron levels (anemia).   Fetal testing to check the health, activity level, and growth of the fetus. Testing is done if you have certain medical conditions or if there are problems during the pregnancy.  FALSE LABOR  You may feel small, irregular contractions that eventually go away. These are called Braxton Hicks contractions, or   false labor. Contractions may last for hours, days, or even weeks before true labor sets in. If contractions come at regular intervals, intensify, or become painful, it is best to be seen by your caregiver.   SIGNS OF LABOR    Menstrual-like cramps.   Contractions that are 5 minutes apart or less.   Contractions that start on the top of the uterus and spread down to the lower abdomen and back.   A sense of increased pelvic pressure or back pain.   A watery or bloody mucus discharge that comes from the vagina.  If you have any of these signs before the 37th week of pregnancy, call your caregiver right away. You need to go to the hospital to get checked immediately.  HOME CARE INSTRUCTIONS    Avoid all  smoking, herbs, alcohol, and unprescribed drugs. These chemicals affect the formation and growth of the baby.   Follow your caregiver's instructions regarding medicine use. There are medicines that are either safe or unsafe to take during pregnancy.   Exercise only as directed by your caregiver. Experiencing uterine cramps is a good sign to stop exercising.   Continue to eat regular, healthy meals.   Wear a good support bra for breast tenderness.   Do not use hot tubs, steam rooms, or saunas.   Wear your seat belt at all times when driving.   Avoid raw meat, uncooked cheese, cat litter boxes, and soil used by cats. These carry germs that can cause birth defects in the baby.   Take your prenatal vitamins.   Try taking a stool softener (if your caregiver approves) if you develop constipation. Eat more high-fiber foods, such as fresh vegetables or fruit and whole grains. Drink plenty of fluids to keep your urine clear or pale yellow.   Take warm sitz baths to soothe any pain or discomfort caused by hemorrhoids. Use hemorrhoid cream if your caregiver approves.   If you develop varicose veins, wear support hose. Elevate your feet for 15 minutes, 3 4 times a day. Limit salt in your diet.   Avoid heavy lifting, wear low heal shoes, and practice good posture.   Rest a lot with your legs elevated if you have leg cramps or low back pain.   Visit your dentist if you have not gone during your pregnancy. Use a soft toothbrush to brush your teeth and be gentle when you floss.   A sexual relationship may be continued unless your caregiver directs you otherwise.   Do not travel far distances unless it is absolutely necessary and only with the approval of your caregiver.   Take prenatal classes to understand, practice, and ask questions about the labor and delivery.   Make a trial run to the hospital.   Pack your hospital bag.   Prepare the baby's nursery.   Continue to go to all your prenatal visits as directed  by your caregiver.  SEEK MEDICAL CARE IF:   You are unsure if you are in labor or if your water has broken.   You have dizziness.   You have mild pelvic cramps, pelvic pressure, or nagging pain in your abdominal area.   You have persistent nausea, vomiting, or diarrhea.   You have a bad smelling vaginal discharge.   You have pain with urination.  SEEK IMMEDIATE MEDICAL CARE IF:    You have a fever.   You are leaking fluid from your vagina.   You have spotting or bleeding from your vagina.     You have severe abdominal cramping or pain.   You have rapid weight loss or gain.   You have shortness of breath with chest pain.   You notice sudden or extreme swelling of your face, hands, ankles, feet, or legs.   You have not felt your baby move in over an hour.   You have severe headaches that do not go away with medicine.   You have vision changes.  Document Released: 11/12/2001 Document Revised: 07/21/2013 Document Reviewed: 01/19/2013  [employer] Patient Information 2014 [employer].

## 2014-04-22 NOTE — Addendum Note (Signed)
Addended by: Aldona Lento on: 04/22/2014 09:20 AM   Modules accepted: Orders

## 2014-04-22 NOTE — Progress Notes (Signed)
Patient reports vaginal swelling; patient reports pain with contractions

## 2014-04-22 NOTE — Progress Notes (Signed)
[place] 03/30/14: appropriate interval growth and normal AFV. Good FM. UCs uncomfortable irregular. Intolerant of VE; plans epidural. Not using marijuana. Consider recheck UDS at 38 wks. Cultures done today.

## 2014-04-23 LAB — GC/CHLAMYDIA PROBE AMP
CT Probe RNA: NEGATIVE
GC PROBE AMP APTIMA: NEGATIVE

## 2014-04-24 LAB — CULTURE, BETA STREP (GROUP B ONLY)

## 2014-04-27 ENCOUNTER — Ambulatory Visit (INDEPENDENT_AMBULATORY_CARE_PROVIDER_SITE_OTHER): Payer: Medicaid Other | Admitting: Advanced Practice Midwife

## 2014-04-27 VITALS — BP 113/64 | HR 89 | Temp 97.6°F | Wt 160.2 lb

## 2014-04-27 DIAGNOSIS — O99320 Drug use complicating pregnancy, unspecified trimester: Principal | ICD-10-CM

## 2014-04-27 DIAGNOSIS — F1920 Other psychoactive substance dependence, uncomplicated: Secondary | ICD-10-CM

## 2014-04-27 DIAGNOSIS — O99340 Other mental disorders complicating pregnancy, unspecified trimester: Secondary | ICD-10-CM

## 2014-04-27 LAB — POCT URINALYSIS DIP (DEVICE)
Bilirubin Urine: NEGATIVE
Glucose, UA: NEGATIVE mg/dL
Hgb urine dipstick: NEGATIVE
Ketones, ur: NEGATIVE mg/dL
Nitrite: NEGATIVE
PROTEIN: NEGATIVE mg/dL
Specific Gravity, Urine: 1.01 (ref 1.005–1.030)
UROBILINOGEN UA: 0.2 mg/dL (ref 0.0–1.0)
pH: 7 (ref 5.0–8.0)

## 2014-04-27 NOTE — Progress Notes (Signed)
C/o of pelvic pressure and irregular contractions.

## 2014-04-27 NOTE — Progress Notes (Signed)
Doing well.  Good fetal movement, denies vaginal bleeding, LOF, regular contractions.  Pt desires cervical exam, reports pelvic pressure occasionally.  Labor precautions given.

## 2014-04-28 ENCOUNTER — Encounter: Payer: Self-pay | Admitting: Advanced Practice Midwife

## 2014-05-04 ENCOUNTER — Ambulatory Visit (INDEPENDENT_AMBULATORY_CARE_PROVIDER_SITE_OTHER): Payer: Medicaid Other | Admitting: Advanced Practice Midwife

## 2014-05-04 ENCOUNTER — Encounter: Payer: Self-pay | Admitting: Advanced Practice Midwife

## 2014-05-04 VITALS — BP 145/85 | HR 108 | Wt 163.2 lb

## 2014-05-04 DIAGNOSIS — O35BXX0 Maternal care for other (suspected) fetal abnormality and damage, fetal cardiac anomalies, not applicable or unspecified: Secondary | ICD-10-CM

## 2014-05-04 DIAGNOSIS — O358XX0 Maternal care for other (suspected) fetal abnormality and damage, not applicable or unspecified: Secondary | ICD-10-CM

## 2014-05-04 NOTE — Patient Instructions (Signed)

## 2014-05-04 NOTE — Progress Notes (Signed)
Doing well. Has irregular contractions. Cervix effacing nicely.

## 2014-05-05 ENCOUNTER — Encounter (HOSPITAL_COMMUNITY): Payer: Self-pay | Admitting: General Practice

## 2014-05-05 ENCOUNTER — Inpatient Hospital Stay (HOSPITAL_COMMUNITY)
Admission: AD | Admit: 2014-05-05 | Discharge: 2014-05-05 | Disposition: A | Discharge status: Home / Self Care | Payer: Medicaid Other | Source: Ambulatory Visit | Attending: Obstetrics and Gynecology | Admitting: Obstetrics and Gynecology

## 2014-05-05 DIAGNOSIS — O479 False labor, unspecified: Secondary | ICD-10-CM | POA: Insufficient documentation

## 2014-05-05 LAB — POCT URINALYSIS DIP (DEVICE)
Bilirubin Urine: NEGATIVE
Glucose, UA: NEGATIVE mg/dL
Ketones, ur: NEGATIVE mg/dL
Nitrite: NEGATIVE
PH: 7 (ref 5.0–8.0)
PROTEIN: NEGATIVE mg/dL
Specific Gravity, Urine: 1.01 (ref 1.005–1.030)
UROBILINOGEN UA: 0.2 mg/dL (ref 0.0–1.0)

## 2014-05-05 NOTE — Discharge Instructions (Signed)

## 2014-05-08 ENCOUNTER — Encounter (HOSPITAL_COMMUNITY): Payer: Medicaid Other | Admitting: Anesthesiology

## 2014-05-08 ENCOUNTER — Inpatient Hospital Stay (HOSPITAL_COMMUNITY)
Admission: AD | Admit: 2014-05-08 | Discharge: 2014-05-09 | DRG: 775 | Disposition: A | Discharge status: Home / Self Care | Payer: Medicaid Other | Source: Ambulatory Visit | Attending: Obstetrics & Gynecology | Admitting: Obstetrics & Gynecology

## 2014-05-08 ENCOUNTER — Encounter (HOSPITAL_COMMUNITY): Payer: Self-pay | Admitting: *Deleted

## 2014-05-08 ENCOUNTER — Inpatient Hospital Stay (HOSPITAL_COMMUNITY): Payer: Medicaid Other | Admitting: Anesthesiology

## 2014-05-08 DIAGNOSIS — Z833 Family history of diabetes mellitus: Secondary | ICD-10-CM

## 2014-05-08 DIAGNOSIS — Z3490 Encounter for supervision of normal pregnancy, unspecified, unspecified trimester: Secondary | ICD-10-CM

## 2014-05-08 DIAGNOSIS — O99340 Other mental disorders complicating pregnancy, unspecified trimester: Secondary | ICD-10-CM

## 2014-05-08 DIAGNOSIS — F1920 Other psychoactive substance dependence, uncomplicated: Secondary | ICD-10-CM

## 2014-05-08 DIAGNOSIS — Z8249 Family history of ischemic heart disease and other diseases of the circulatory system: Secondary | ICD-10-CM

## 2014-05-08 DIAGNOSIS — Z87891 Personal history of nicotine dependence: Secondary | ICD-10-CM | POA: Diagnosis not present

## 2014-05-08 DIAGNOSIS — F1210 Cannabis abuse, uncomplicated: Secondary | ICD-10-CM

## 2014-05-08 DIAGNOSIS — O99344 Other mental disorders complicating childbirth: Principal | ICD-10-CM | POA: Diagnosis present

## 2014-05-08 DIAGNOSIS — O99320 Drug use complicating pregnancy, unspecified trimester: Secondary | ICD-10-CM

## 2014-05-08 LAB — CBC
HEMATOCRIT: 31.4 % — AB (ref 36.0–46.0)
Hemoglobin: 10.3 g/dL — ABNORMAL LOW (ref 12.0–15.0)
MCH: 29.3 pg (ref 26.0–34.0)
MCHC: 32.8 g/dL (ref 30.0–36.0)
MCV: 89.2 fL (ref 78.0–100.0)
PLATELETS: 209 10*3/uL (ref 150–400)
RBC: 3.52 MIL/uL — ABNORMAL LOW (ref 3.87–5.11)
RDW: 12.9 % (ref 11.5–15.5)
WBC: 12.9 10*3/uL — ABNORMAL HIGH (ref 4.0–10.5)

## 2014-05-08 LAB — RAPID URINE DRUG SCREEN, HOSP PERFORMED
AMPHETAMINES: NOT DETECTED
BARBITURATES: NOT DETECTED
Benzodiazepines: NOT DETECTED
COCAINE: NOT DETECTED
Opiates: NOT DETECTED
TETRAHYDROCANNABINOL: NOT DETECTED

## 2014-05-08 LAB — RPR

## 2014-05-08 MED ORDER — ONDANSETRON HCL 4 MG/2ML IJ SOLN: 4.0000 mg | Freq: Four times a day (QID) | INTRAMUSCULAR | Status: DC | PRN

## 2014-05-08 MED ORDER — DIPHENHYDRAMINE HCL 50 MG/ML IJ SOLN: 12.5000 mg | INTRAMUSCULAR | Status: DC | PRN

## 2014-05-08 MED ORDER — EPHEDRINE 5 MG/ML INJ
10.0000 mg | INTRAVENOUS | Status: DC | PRN
  Filled 2014-05-08: qty 2

## 2014-05-08 MED ORDER — PHENYLEPHRINE 40 MCG/ML (10ML) SYRINGE FOR IV PUSH (FOR BLOOD PRESSURE SUPPORT)
80.0000 ug | PREFILLED_SYRINGE | INTRAVENOUS | Status: DC | PRN
  Filled 2014-05-08: qty 10
  Filled 2014-05-08: qty 2

## 2014-05-08 MED ORDER — TERBUTALINE SULFATE 1 MG/ML IJ SOLN: 0.2500 mg | Freq: Once | INTRAMUSCULAR | Status: DC | PRN

## 2014-05-08 MED ORDER — FENTANYL 2.5 MCG/ML BUPIVACAINE 1/10 % EPIDURAL INFUSION (WH - ANES)
14.0000 mL/h | INTRAMUSCULAR | Status: DC | PRN
  Filled 2014-05-08: qty 125

## 2014-05-08 MED ORDER — LIDOCAINE HCL (PF) 1 % IJ SOLN
INTRAMUSCULAR | Status: DC | PRN
  Administered 2014-05-08 (×2): 4 mL

## 2014-05-08 MED ORDER — ONDANSETRON HCL 4 MG PO TABS: 4.0000 mg | ORAL_TABLET | ORAL | Status: DC | PRN

## 2014-05-08 MED ORDER — TETANUS-DIPHTH-ACELL PERTUSSIS 5-2.5-18.5 LF-MCG/0.5 IM SUSP: 0.5000 mL | Freq: Once | INTRAMUSCULAR | Status: DC

## 2014-05-08 MED ORDER — PHENYLEPHRINE 40 MCG/ML (10ML) SYRINGE FOR IV PUSH (FOR BLOOD PRESSURE SUPPORT)
80.0000 ug | PREFILLED_SYRINGE | INTRAVENOUS | Status: DC | PRN
  Filled 2014-05-08: qty 2

## 2014-05-08 MED ORDER — PRENATAL MULTIVITAMIN CH
1.0000 | ORAL_TABLET | Freq: Every day | ORAL | Status: DC
Start: 2014-05-09 — End: 2014-05-09
  Administered 2014-05-09: 1 via ORAL
  Filled 2014-05-08: qty 1

## 2014-05-08 MED ORDER — CITRIC ACID-SODIUM CITRATE 334-500 MG/5ML PO SOLN: 30.0000 mL | ORAL | Status: DC | PRN

## 2014-05-08 MED ORDER — FENTANYL 2.5 MCG/ML BUPIVACAINE 1/10 % EPIDURAL INFUSION (WH - ANES)
INTRAMUSCULAR | Status: DC | PRN
  Administered 2014-05-08: 14 mL/h via EPIDURAL

## 2014-05-08 MED ORDER — LIDOCAINE HCL (PF) 1 % IJ SOLN
30.0000 mL | INTRAMUSCULAR | Status: DC | PRN
  Filled 2014-05-08: qty 30

## 2014-05-08 MED ORDER — OXYCODONE-ACETAMINOPHEN 5-325 MG PO TABS
1.0000 | ORAL_TABLET | ORAL | Status: DC | PRN
  Administered 2014-05-09 (×2): 2 via ORAL
  Administered 2014-05-09 (×2): 1 via ORAL
  Filled 2014-05-08 (×2): qty 1
  Filled 2014-05-08 (×2): qty 2

## 2014-05-08 MED ORDER — BENZOCAINE-MENTHOL 20-0.5 % EX AERO
1.0000 "application " | INHALATION_SPRAY | CUTANEOUS | Status: DC | PRN
  Administered 2014-05-09: 1 via TOPICAL
  Filled 2014-05-08: qty 56

## 2014-05-08 MED ORDER — FLEET ENEMA 7-19 GM/118ML RE ENEM: 1.0000 | ENEMA | RECTAL | Status: DC | PRN

## 2014-05-08 MED ORDER — LACTATED RINGERS IV SOLN
500.0000 mL | Freq: Once | INTRAVENOUS | Status: AC
  Administered 2014-05-08: 500 mL via INTRAVENOUS

## 2014-05-08 MED ORDER — SIMETHICONE 80 MG PO CHEW: 80.0000 mg | CHEWABLE_TABLET | ORAL | Status: DC | PRN

## 2014-05-08 MED ORDER — ACETAMINOPHEN 325 MG PO TABS: 650.0000 mg | ORAL_TABLET | ORAL | Status: DC | PRN

## 2014-05-08 MED ORDER — DIPHENHYDRAMINE HCL 25 MG PO CAPS: 25.0000 mg | ORAL_CAPSULE | Freq: Four times a day (QID) | ORAL | Status: DC | PRN

## 2014-05-08 MED ORDER — FENTANYL 2.5 MCG/ML BUPIVACAINE 1/10 % EPIDURAL INFUSION (WH - ANES): 14.0000 mL/h | INTRAMUSCULAR | Status: DC | PRN

## 2014-05-08 MED ORDER — ZOLPIDEM TARTRATE 5 MG PO TABS: 5.0000 mg | ORAL_TABLET | Freq: Every evening | ORAL | Status: DC | PRN

## 2014-05-08 MED ORDER — LACTATED RINGERS IV SOLN: 500.0000 mL | INTRAVENOUS | Status: DC | PRN

## 2014-05-08 MED ORDER — EPHEDRINE 5 MG/ML INJ
10.0000 mg | INTRAVENOUS | Status: DC | PRN
  Filled 2014-05-08: qty 4
  Filled 2014-05-08: qty 2

## 2014-05-08 MED ORDER — OXYTOCIN 40 UNITS IN LACTATED RINGERS INFUSION - SIMPLE MED: 62.5000 mL/h | INTRAVENOUS | Status: DC

## 2014-05-08 MED ORDER — DIBUCAINE 1 % RE OINT: 1.0000 "application " | TOPICAL_OINTMENT | RECTAL | Status: DC | PRN

## 2014-05-08 MED ORDER — IBUPROFEN 600 MG PO TABS
600.0000 mg | ORAL_TABLET | Freq: Four times a day (QID) | ORAL | Status: DC | PRN
Start: 2014-05-08 — End: 2014-05-08

## 2014-05-08 MED ORDER — WITCH HAZEL-GLYCERIN EX PADS: 1.0000 "application " | MEDICATED_PAD | CUTANEOUS | Status: DC | PRN

## 2014-05-08 MED ORDER — SENNOSIDES-DOCUSATE SODIUM 8.6-50 MG PO TABS
2.0000 | ORAL_TABLET | ORAL | Status: DC
  Administered 2014-05-08: 2 via ORAL
  Filled 2014-05-08 (×2): qty 2

## 2014-05-08 MED ORDER — OXYTOCIN BOLUS FROM INFUSION
500.0000 mL | INTRAVENOUS | Status: DC
  Administered 2014-05-08: 500 mL via INTRAVENOUS

## 2014-05-08 MED ORDER — ONDANSETRON HCL 4 MG/2ML IJ SOLN: 4.0000 mg | INTRAMUSCULAR | Status: DC | PRN

## 2014-05-08 MED ORDER — OXYTOCIN 40 UNITS IN LACTATED RINGERS INFUSION - SIMPLE MED
1.0000 m[IU]/min | INTRAVENOUS | Status: DC
  Administered 2014-05-08: 2 m[IU]/min via INTRAVENOUS
  Filled 2014-05-08: qty 1000

## 2014-05-08 MED ORDER — LACTATED RINGERS IV SOLN
INTRAVENOUS | Status: DC
  Administered 2014-05-08: 05:00:00 via INTRAVENOUS

## 2014-05-08 MED ORDER — OXYCODONE-ACETAMINOPHEN 5-325 MG PO TABS: 1.0000 | ORAL_TABLET | ORAL | Status: DC | PRN

## 2014-05-08 MED ORDER — IBUPROFEN 600 MG PO TABS
600.0000 mg | ORAL_TABLET | Freq: Four times a day (QID) | ORAL | Status: DC
  Administered 2014-05-08 – 2014-05-09 (×5): 600 mg via ORAL
  Filled 2014-05-08 (×5): qty 1

## 2014-05-08 MED ORDER — MEASLES, MUMPS & RUBELLA VAC ~~LOC~~ INJ
0.5000 mL | INJECTION | Freq: Once | SUBCUTANEOUS | Status: DC
  Filled 2014-05-08: qty 0.5

## 2014-05-08 MED ORDER — LANOLIN HYDROUS EX OINT: TOPICAL_OINTMENT | CUTANEOUS | Status: DC | PRN

## 2014-05-08 NOTE — H&P (Signed)
Audrey Schultz is a 23 y.o. female G1 @ 38.0wks by LMP and confirmed by 5wk scan presenting for eval of labor. Denies leaking or bldg. Reports +FM. No N/V/D or fever. Her preg has been followed by the [facility] and has been remarkable for 1) hx depression 2) fetal EIF- resolved 3) MJ use this preg with + drug screens.  History OB History   Grav Para Term Preterm Abortions TAB SAB Ect Mult Living   1              Past Medical History  Diagnosis Date  . Medical history non-contributory    Past Surgical History  Procedure Laterality Date  . No past surgeries     Family History: family history includes Diabetes in her mother; Hypertension in her mother. Social History:  reports that she quit smoking about 8 months ago. Her smoking use included Cigarettes. She smoked 0.50 packs per day. She does not have any smokeless tobacco history on file. She reports that she uses illicit drugs (Marijuana). She reports that she does not drink alcohol.   Prenatal Transfer Tool  Maternal Diabetes: No Genetic Screening: Normal Maternal Ultrasounds/Referrals: Abnormal:  Findings:   Isolated EIF (echogenic intracardiac focus)- resolved Fetal Ultrasounds or other Referrals:  Referred to Materal Fetal Medicine  Maternal Substance Abuse:  Yes:  Type: Marijuana- + screen during preg (Nov and Apr)- UDS pending Significant Maternal Medications:  None Significant Maternal Lab Results:  Lab values include: Group B Strep negative Other Comments:  None  ROS  Dilation: 4.5 Effacement (%): 70 Station: +1 Exam by:: Judeth Horn RNC Blood pressure 133/79, pulse 98, temperature 98.5 F (36.9 C), temperature source Oral, resp. rate 16, height 5\' 5"  (1.651 m), weight 73.936 kg (163 lb), last menstrual period 08/15/2013, SpO2 99.00%. Exam Physical Exam  Constitutional: She is oriented to person, place, and time. She appears well-developed.  HENT:  Head: Normocephalic.  Cardiovascular: Normal rate.   Respiratory:  Effort normal.  GI:  EFM 115-120, +accels, no decels, occ variables Ctx q 4-77mins  Musculoskeletal: Normal range of motion.  Neurological: She is alert and oriented to person, place, and time.  Skin: Skin is warm and dry.  Psychiatric: She has a normal mood and affect. Her behavior is normal. Thought content normal.    Prenatal labs: ABO, Rh: A/POS/-- (11/19 1534) Antibody: NEG (11/19 1534) Rubella: 10.60 (11/19 1534) RPR: NON REAC (04/01 1113)  HBsAg: NEGATIVE (11/19 1534)  HIV: NON REACTIVE (04/01 1113)  GBS: Negative (05/22 0000)   Assessment/Plan: IUP at 38.0wks Early labor  Admit to [facility] Expectant management Plans epidural Anticipate SVD   Arabella Merles CNM 05/08/2014, 8:59 AM

## 2014-05-08 NOTE — Anesthesia Procedure Notes (Signed)
Epidural Patient location during procedure: OB Start time: 05/08/2014 8:05 AM End time: 05/08/2014 8:15 AM  Staffing Anesthesiologist: Lewie Loron R Performed by: anesthesiologist   Preanesthetic Checklist Completed: patient identified, pre-op evaluation, timeout performed, IV checked, risks and benefits discussed and monitors and equipment checked  Epidural Patient position: sitting Prep: site prepped and draped and DuraPrep Patient monitoring: heart rate Approach: midline Location: L2-L3 Injection technique: LOR air and LOR saline  Needle:  Needle type: Tuohy  Needle gauge: 17 G Needle length: 9 cm Needle insertion depth: 6 cm Catheter type: closed end flexible Catheter size: 19 Gauge Catheter at skin depth: 12 cm Test dose: negative  Assessment Sensory level: T8 Events: blood not aspirated, injection not painful, no injection resistance, negative IV test and no paresthesia  Additional Notes Reason for block:procedure for pain

## 2014-05-08 NOTE — Progress Notes (Signed)
Audrey Schultz is a 23 y.o. G1P0 at [redacted]w[redacted]d admitted for active labor, rupture of membranes  Subjective:  Comfortable with epidural and sleeping.   Objective: BP 108/61  Pulse 90  Temp(Src) 97.7 F (36.5 C) (Axillary)  Resp 20  Ht 5\' 5"  (1.651 m)  Wt 73.936 kg (163 lb)  BMI 27.12 kg/m2  SpO2 99%  LMP 08/15/2013      FHT:  FHR: 110 bpm, variability: moderate,  accelerations:  Present,  decelerations:  Absent UC:   irregular, every 2-6 minutes  SVE:   Dilation: 4.5 Effacement (%): 90 Station: 0 Exam by:: Audrey Schultz, Audrey Schultz   Labs: Lab Results  Component Value Date   WBC 12.9* 05/08/2014   HGB 10.3* 05/08/2014   HCT 31.4* 05/08/2014   MCV 89.2 05/08/2014   PLT 209 05/08/2014    Assessment / Plan: early labor and now SROM  Labor: small forebag ruptured- clear fluid. no change in cervix. will start pitocin Fetal Wellbeing:  Category I Pain Control:  Epidural I/D:  n/a Anticipated MOD:  NSVD  Audrey Schultz 05/08/2014, 11:09 AM

## 2014-05-08 NOTE — Anesthesia Preprocedure Evaluation (Signed)
Anesthesia Evaluation  Patient identified by MRN, date of birth, ID band Patient awake    Reviewed: Allergy & Precautions, H&P , NPO status , Patient's Chart, lab work & pertinent test results  Airway Mallampati: II TM Distance: >3 FB Neck ROM: Full    Dental  (+) Dental Advisory Given   Pulmonary neg pulmonary ROS, former smoker,  breath sounds clear to auscultation        Cardiovascular negative cardio ROS  Rhythm:Regular Rate:Normal     Neuro/Psych PSYCHIATRIC DISORDERS negative neurological ROS     GI/Hepatic negative GI ROS, Neg liver ROS,   Endo/Other  negative endocrine ROS  Renal/GU negative Renal ROS     Musculoskeletal negative musculoskeletal ROS (+)   Abdominal   Peds  Hematology negative hematology ROS (+)   Anesthesia Other Findings   Reproductive/Obstetrics negative OB ROS                           Anesthesia Physical Anesthesia Plan  ASA: II  Anesthesia Plan: Epidural   Post-op Pain Management:    Induction:   Airway Management Planned:   Additional Equipment:   Intra-op Plan:   Post-operative Plan:   Informed Consent: I have reviewed the patients History and Physical, chart, labs and discussed the procedure including the risks, benefits and alternatives for the proposed anesthesia with the patient or authorized representative who has indicated his/her understanding and acceptance.     Plan Discussed with:   Anesthesia Plan Comments:         Anesthesia Quick Evaluation

## 2014-05-08 NOTE — MAU Note (Signed)
Contractions every 5 minutes. Positive fetal movement. Denies LOF. Denies vaginal bleeding. Denies complications with pregnancy.

## 2014-05-09 MED ORDER — IBUPROFEN 600 MG PO TABS: 600.0000 mg | ORAL_TABLET | Freq: Four times a day (QID) | ORAL | Status: DC | PRN

## 2014-05-09 NOTE — Discharge Summary (Signed)
Obstetric Discharge Summary Reason for Admission: onset of labor Prenatal Procedures: NST Intrapartum Procedures: spontaneous vaginal delivery Postpartum Procedures: none Complications-Operative and Postpartum: none Hemoglobin  Date Value Ref Range Status  05/08/2014 10.3* 12.0 - 15.0 g/dL Final     HCT  Date Value Ref Range Status  05/08/2014 31.4* 36.0 - 46.0 % Final  Hospital Course: Audrey Schultz is a 23 y.o. female G1 @ 38.0wks by LMP and confirmed by 5wk scan presenting for eval of labor. Denies leaking or bldg. Reports +FM. No N/V/D or fever. Her preg has been followed by the [facility] and has been remarkable for 1) hx depression 2) fetal EIF- resolved 3) MJ use this preg with + drug screens.  Delivery Note  At 3:20 PM a viable female was delivered via Vaginal, Spontaneous Delivery (Presentation: Left Occiput Anterior). APGAR: 9, 9; weight 6 lb 5.1 oz (2865 g).  Placenta status: Intact, Spontaneous. Cord: 3 vessels with the following complications: None.  Anesthesia: Epidural  Episiotomy: None  Lacerations: None  Suture Repair: na  Est. Blood Loss (mL): 350  Mom to postpartum. Baby to Couplet care / Skin to Skin.  Pt pushed with good maternal effort to deliver a liveborn female via NSVD with spontaneous cry. Baby placed on maternal abdomen. Delayed cord clamping performed. Cord cut by FOB. Placenta delivered intact with 3V cord via traction and pitocin. No tears. No complications. Mom and baby to postpartum.  Audrey Schultz  05/08/2014, 4:46 PM        Physical Exam:  General: alert, cooperative and no distress Lochia: appropriate Uterine Fundus: firm Incision: healing well DVT Evaluation: No evidence of DVT seen on physical exam.  Discharge Diagnoses: Term Pregnancy-delivered  Discharge Information: Date: 05/09/2014 Activity: unrestricted and pelvic rest Diet: routine Medications: PNV and Ibuprofen Condition: stable and improved Instructions: refer to practice specific  booklet Discharge to: home Follow-up Information   Follow up with [facility]. Schedule an appointment as soon as possible for a visit in 4 weeks.   Contact information:   [address] [place] [address] [phone]      Newborn Data: Live born female  Birth Weight: 6 lb 5.1 oz (2865 g) APGAR: 9, 9  Home with mother Requests Rx for Percocet, but patient appears very comfortable and I do not feel she requires Percocet.Audrey Schultz 05/09/2014, 7:36 AM

## 2014-05-09 NOTE — Lactation Note (Signed)
This note was copied from the chart of Audrey Fantasy M[place]olejos. Lactation Consultation Note Went to see pt. Early in the night and was a sleep. Called into rm. By RN. Mom feeding in side laying position assisted by RN. Baby latched well, Mom denied pain during feeding. Hand expression taught. None noted at this time. Mom encouraged to feed baby 8-12 times/24 hours and with feeding cues. Reviewed Baby & Me book's Breastfeeding Basics. Specifics of an asymmetric latch shown. Mom encouraged to feed baby w/feeding cues. WH/LC brochure given w/resources, support groups and LC services. Educated about newborn behaviorReferred to Baby and Me Book in Breastfeeding section Pg. 22-23 for position options and Proper latch demonstration.Encouraged to call for assistance if needed and to verify proper latch. Mom has semi flat nipples, but rolls well in finger tips and compresses well into baby's mouth for a deep latch. No bruising noted to nipples at this time. Patient Name: Audrey Schultz [id number] Date: 05/09/2014 Reason for consult: Initial assessment   Maternal Data Infant to breast within first hour of birth: Yes Has patient been taught Hand Expression?: Yes Does the patient have breastfeeding experience prior to this delivery?: No  Feeding Feeding Type: Breast Fed Length of feed: 20 min (still feeding)  LATCH Score/Interventions Latch: Grasps breast easily, tongue down, lips flanged, rhythmical sucking.  Audible Swallowing: A few with stimulation Intervention(s): Skin to skin;Hand expression;Alternate breast massage  Type of Nipple: Everted at rest and after stimulation (semi flat, everts w/stimulation)  Comfort (Breast/Nipple): Soft / non-tender     Hold (Positioning): Assistance needed to correctly position infant at breast and maintain latch. Intervention(s): Breastfeeding basics reviewed;Support Pillows;Position options;Skin to skin  LATCH Score: 8  Lactation Tools Discussed/Used      Consult Status Consult Status: Follow-up Date: 05/09/14 Follow-up type: In-patient    Charyl Dancer 05/09/2014, 6:22 AM

## 2014-05-09 NOTE — Progress Notes (Signed)
Ur chart review completed.  

## 2014-05-09 NOTE — Discharge Instructions (Signed)

## 2014-05-09 NOTE — Lactation Note (Signed)
This note was copied from the chart of Audrey Charlestine Knoop. Lactation Consultation Note: called to assist mom with feeding. Baby latched well in side lying position- mom reports she feels tugging no pain. No questions at present. To call prn  Patient Name: Audrey Schultz [id number] Date: 05/09/2014     Maternal Data    Feeding Feeding Type: Breast Fed  LATCH Score/Interventions Latch: Grasps breast easily, tongue down, lips flanged, rhythmical sucking.  Audible Swallowing: A few with stimulation  Type of Nipple: Everted at rest and after stimulation  Comfort (Breast/Nipple): Soft / non-tender     Hold (Positioning): Assistance needed to correctly position infant at breast and maintain latch.  LATCH Score: 8  Lactation Tools Discussed/Used     Consult Status Consult Status: Follow-up Date: 05/10/14 Follow-up type: In-patient    Pamelia Hoit 05/09/2014, 12:45 PM

## 2014-05-09 NOTE — Anesthesia Postprocedure Evaluation (Signed)
  Anesthesia Post-op Note  Patient: Audrey Schultz  Procedure(s) Performed: * No procedures listed *  Patient Location: Mother/Baby  Anesthesia Type:Epidural  Level of Consciousness: awake  Airway and Oxygen Therapy: Patient Spontanous Breathing  Post-op Pain: mild  Post-op Assessment: Patient's Cardiovascular Status Stable and Respiratory Function Stable  Post-op Vital Signs: stable  Last Vitals:  Filed Vitals:   05/09/14 0541  BP: 116/60  Pulse: 87  Temp: 36.8 C  Resp: 16    Complications: No apparent anesthesia complications

## 2014-05-17 ENCOUNTER — Encounter: Payer: Self-pay | Admitting: General Practice

## 2014-05-30 ENCOUNTER — Encounter: Payer: Self-pay | Admitting: General Practice

## 2014-06-29 ENCOUNTER — Ambulatory Visit: Payer: Medicaid Other | Admitting: Obstetrics & Gynecology

## 2014-10-03 ENCOUNTER — Encounter: Payer: Self-pay | Admitting: *Deleted

## 2014-10-03 ENCOUNTER — Ambulatory Visit: Payer: Medicaid Other | Admitting: Obstetrics and Gynecology

## 2014-10-03 ENCOUNTER — Encounter (HOSPITAL_COMMUNITY): Payer: Self-pay | Admitting: *Deleted

## 2014-11-16 ENCOUNTER — Encounter (HOSPITAL_COMMUNITY): Payer: Self-pay | Admitting: Emergency Medicine

## 2014-11-16 ENCOUNTER — Emergency Department (HOSPITAL_COMMUNITY)
Admission: EM | Admit: 2014-11-16 | Discharge: 2014-11-16 | Disposition: A | Discharge status: Home / Self Care | Payer: Medicaid Other | Attending: Emergency Medicine | Admitting: Emergency Medicine

## 2014-11-16 DIAGNOSIS — Z3202 Encounter for pregnancy test, result negative: Secondary | ICD-10-CM | POA: Diagnosis not present

## 2014-11-16 DIAGNOSIS — N946 Dysmenorrhea, unspecified: Secondary | ICD-10-CM | POA: Diagnosis not present

## 2014-11-16 DIAGNOSIS — Z720 Tobacco use: Secondary | ICD-10-CM | POA: Diagnosis not present

## 2014-11-16 DIAGNOSIS — Z79899 Other long term (current) drug therapy: Secondary | ICD-10-CM | POA: Insufficient documentation

## 2014-11-16 HISTORY — DX: Dysmenorrhea, unspecified: N94.6

## 2014-11-16 LAB — URINE MICROSCOPIC-ADD ON

## 2014-11-16 LAB — URINALYSIS, ROUTINE W REFLEX MICROSCOPIC
Bilirubin Urine: NEGATIVE
Glucose, UA: NEGATIVE mg/dL
Ketones, ur: NEGATIVE mg/dL
LEUKOCYTES UA: NEGATIVE
NITRITE: NEGATIVE
PROTEIN: 30 mg/dL — AB
Specific Gravity, Urine: 1.024 (ref 1.005–1.030)
UROBILINOGEN UA: 0.2 mg/dL (ref 0.0–1.0)
pH: 5.5 (ref 5.0–8.0)

## 2014-11-16 LAB — PREGNANCY, URINE: PREG TEST UR: NEGATIVE

## 2014-11-16 MED ORDER — HYDROCODONE-ACETAMINOPHEN 5-325 MG PO TABS: 1.0000 | ORAL_TABLET | ORAL | Status: DC | PRN

## 2014-11-16 MED ORDER — IBUPROFEN 800 MG PO TABS
800.0000 mg | ORAL_TABLET | Freq: Once | ORAL | Status: AC
  Administered 2014-11-16: 800 mg via ORAL
  Filled 2014-11-16: qty 1

## 2014-11-16 MED ORDER — HYDROCODONE-ACETAMINOPHEN 5-325 MG PO TABS
1.0000 | ORAL_TABLET | Freq: Once | ORAL | Status: AC
  Administered 2014-11-16: 1 via ORAL
  Filled 2014-11-16: qty 1

## 2014-11-16 MED ORDER — NAPROXEN 500 MG PO TABS: 500.0000 mg | ORAL_TABLET | Freq: Two times a day (BID) | ORAL | Status: DC

## 2014-11-16 NOTE — Discharge Instructions (Signed)
It is recommended that you contact, and follow up with a GYN physician for your ongoing menstrual difficulties.  Dysmenorrhea Menstrual cramps (dysmenorrhea) are caused by the muscles of the uterus tightening (contracting) during a menstrual period. For some women, this discomfort is merely bothersome. For others, dysmenorrhea can be severe enough to interfere with everyday activities for a few days each month. Primary dysmenorrhea is menstrual cramps that last a couple of days when you start having menstrual periods or soon after. This often begins after a teenager starts having her period. As a woman gets older or has a baby, the cramps will usually lessen or disappear. Secondary dysmenorrhea begins later in life, lasts longer, and the pain may be stronger than primary dysmenorrhea. The pain may start before the period and last a few days after the period.  CAUSES  Dysmenorrhea is usually caused by an underlying problem, such as:  The tissue lining the uterus grows outside of the uterus in other areas of the body (endometriosis).  The endometrial tissue, which normally lines the uterus, is found in or grows into the muscular walls of the uterus (adenomyosis).  The pelvic blood vessels are engorged with blood just before the menstrual period (pelvic congestive syndrome).  Overgrowth of cells (polyps) in the lining of the uterus or cervix.  Falling down of the uterus (prolapse) because of loose or stretched ligaments.  Depression.  Bladder problems, infection, or inflammation.  Problems with the intestine, a tumor, or irritable bowel syndrome.  Cancer of the female organs or bladder.  A severely tipped uterus.  A very tight opening or closed cervix.  Noncancerous tumors of the uterus (fibroids).  Pelvic inflammatory disease (PID).  Pelvic scarring (adhesions) from a previous surgery.  Ovarian cyst.  An intrauterine device (IUD) used for birth control. RISK FACTORS You may be  at greater risk of dysmenorrhea if:  You are younger than age 21.  You started puberty early.  You have irregular or heavy bleeding.  You have never given birth.  You have a family history of this problem.  You are a smoker. SIGNS AND SYMPTOMS   Cramping or throbbing pain in your lower abdomen.  Headaches.  Lower back pain.  Nausea or vomiting.  Diarrhea.  Sweating or dizziness.  Loose stools. DIAGNOSIS  A diagnosis is based on your history, symptoms, physical exam, diagnostic tests, or procedures. Diagnostic tests or procedures may include:  Blood tests.  Ultrasonography.  An examination of the lining of the uterus (dilation and curettage, D&C).  An examination inside your abdomen or pelvis with a scope (laparoscopy).  X-rays.  CT scan.  MRI.  An examination inside the bladder with a scope (cystoscopy).  An examination inside the intestine or stomach with a scope (colonoscopy, gastroscopy). TREATMENT  Treatment depends on the cause of the dysmenorrhea. Treatment may include:  Pain medicine prescribed by your health care provider.  Birth control pills or an IUD with progesterone hormone in it.  Hormone replacement therapy.  Nonsteroidal anti-inflammatory drugs (NSAIDs). These may help stop the production of prostaglandins.  Surgery to remove adhesions, endometriosis, ovarian cyst, or fibroids.  Removal of the uterus (hysterectomy).  Progesterone shots to stop the menstrual period.  Cutting the nerves on the sacrum that go to the female organs (presacral neurectomy).  Electric current to the sacral nerves (sacral nerve stimulation).  Antidepressant medicine.  Psychiatric therapy, counseling, or group therapy.  Exercise and physical therapy.  Meditation and yoga therapy.  Acupuncture. HOME CARE INSTRUCTIONS  Only take over-the-counter or prescription medicines as directed by your health care provider.  Place a heating pad or hot water  bottle on your lower back or abdomen. Do not sleep with the heating pad.  Use aerobic exercises, walking, swimming, biking, and other exercises to help lessen the cramping.  Massage to the lower back or abdomen may help.  Stop smoking.  Avoid alcohol and caffeine. SEEK MEDICAL CARE IF:   Your pain does not get better with medicine.  You have pain with sexual intercourse.  Your pain increases and is not controlled with medicines.  You have abnormal vaginal bleeding with your period.  You develop nausea or vomiting with your period that is not controlled with medicine. SEEK IMMEDIATE MEDICAL CARE IF:  You pass out.  Document Released: 11/18/2005 Document Revised: 07/21/2013 Document Reviewed: 05/06/2013 [facility] Patient Information 2015 [employer] [place]. This information is not intended to replace advice given to you by your health care provider. Make sure you discuss any questions you have with your health care provider.

## 2014-11-16 NOTE — ED Notes (Signed)
Pt reports she has very bad cramps with her menstrual cycles, pt reports the cramping has increased since she gave birth to her daughter 6 months ago, pt reports her pain started at 0100, pt states her cycle started yesterday. Pt reports heavy clot like bleeding. Pt reports nausea with one episode of vomiting.

## 2014-11-16 NOTE — ED Notes (Signed)
MD at bedside. 

## 2014-11-16 NOTE — ED Provider Notes (Signed)
CSN: [id number]     Arrival date & time 11/16/14  [id number] History   First MD Initiated Contact with Patient 11/16/14 0516     Chief Complaint  Patient presents with  . Dysmenorrhea      HPI  Patient resists evaluation of "really bad.". States she started her menstrual cycle yesterday. His had heavier than usual bleeding and abdominal cramping. He states "it feels like someone got a hold of the uterus that is ringing and out". No syncopal. Not lightheaded. No discharge. No dyspareunia. No urinary symptoms.  Past Medical History  Diagnosis Date  . Medical history non-contributory   . Dysmenorrhea    Past Surgical History  Procedure Laterality Date  . No past surgeries     Family History  Problem Relation Age of Onset  . Hypertension Mother   . Diabetes Mother    History  Substance Use Topics  . Smoking status: Current Every Day Smoker -- 0.00 packs/day    Types: Cigarettes  . Smokeless tobacco: Not on file  . Alcohol Use: Yes     Comment: occasionally   OB History    Gravida Para Term Preterm AB TAB SAB Ectopic Multiple Living   1 1 1       1      Review of Systems  Constitutional: Negative for fever, chills, diaphoresis, appetite change and fatigue.  HENT: Negative for mouth sores, sore throat and trouble swallowing.   Eyes: Negative for visual disturbance.  Respiratory: Negative for cough, chest tightness, shortness of breath and wheezing.   Cardiovascular: Negative for chest pain.  Gastrointestinal: Negative for nausea, vomiting, abdominal pain, diarrhea and abdominal distention.  Endocrine: Negative for polydipsia, polyphagia and polyuria.  Genitourinary: Positive for menstrual problem. Negative for dysuria, frequency and hematuria.  Musculoskeletal: Negative for gait problem.  Skin: Negative for color change, pallor and rash.  Neurological: Negative for dizziness, syncope, light-headedness and headaches.  Hematological: Does not bruise/bleed easily.   Psychiatric/Behavioral: Negative for behavioral problems and confusion.      Allergies  Shellfish allergy  Home Medications   Prior to Admission medications   Medication Sig Start Date End Date Taking? Authorizing Provider  ibuprofen (ADVIL,MOTRIN) 600 MG tablet Take 1 tablet (600 mg total) by mouth every 6 (six) hours as needed. Patient taking differently: Take 600 mg by mouth every 6 (six) hours as needed for mild pain.  05/09/14  Yes Aviva SignsMarie L Williams, CNM  HYDROcodone-acetaminophen (NORCO/VICODIN) 5-325 MG per tablet Take 1 tablet by mouth every 4 (four) hours as needed. 11/16/14   Rolland PorterMark Olena Willy, MD  naproxen (NAPROSYN) 500 MG tablet Take 1 tablet (500 mg total) by mouth 2 (two) times daily. 11/16/14   Rolland PorterMark Mallory Enriques, MD  Prenatal Vit-Fe Fumarate-FA (PRENATAL MULTIVITAMIN) TABS tablet Take 1 tablet by mouth daily at 12 noon.    Historical Provider, MD   BP 102/63 mmHg  Pulse 71  Temp(Src) 97.8 F (36.6 C)  Resp 24  Ht 5\' 5"  (1.651 m)  Wt 147 lb (66.679 kg)  BMI 24.46 kg/m2  SpO2 97%  LMP 11/15/2014 (Exact Date)  Breastfeeding? No Physical Exam  Constitutional: She is oriented to person, place, and time. She appears well-developed and well-nourished. No distress.  HENT:  Head: Normocephalic.  Eyes: Conjunctivae are normal. Pupils are equal, round, and reactive to light. No scleral icterus.  Neck: Normal range of motion. Neck supple. No thyromegaly present.  Cardiovascular: Normal rate and regular rhythm.  Exam reveals no gallop and no friction rub.  No murmur heard. Pulmonary/Chest: Effort normal and breath sounds normal. No respiratory distress. She has no wheezes. She has no rales.  Abdominal: Soft. Bowel sounds are normal. She exhibits no distension. There is no tenderness. There is no rebound.  Musculoskeletal: Normal range of motion.  Neurological: She is alert and oriented to person, place, and time.  Skin: Skin is warm and dry. No rash noted.  Psychiatric: She has a  normal mood and affect. Her behavior is normal.    ED Course  Procedures (including critical care time) Labs Review Labs Reviewed  URINALYSIS, ROUTINE W REFLEX MICROSCOPIC - Abnormal; Notable for the following:    APPearance HAZY (*)    Hgb urine dipstick LARGE (*)    Protein, ur 30 (*)    All other components within normal limits  URINE MICROSCOPIC-ADD ON - Abnormal; Notable for the following:    Squamous Epithelial / LPF MANY (*)    Bacteria, UA MANY (*)    All other components within normal limits  PREGNANCY, URINE    Imaging Review No results found.   EKG Interpretation None      MDM   Final diagnoses:  Dysmenorrhea    Benign abdominal exam. Plan is rule out pregnancy. Symptom control.    Rolland PorterMark Sallie Staron, MD 11/19/14 [phone]

## 2016-05-18 ENCOUNTER — Inpatient Hospital Stay (HOSPITAL_COMMUNITY)
Admission: AD | Admit: 2016-05-18 | Discharge: 2016-05-18 | Disposition: A | Discharge status: Home / Self Care | Payer: Medicaid Other | Source: Ambulatory Visit | Attending: Obstetrics and Gynecology | Admitting: Obstetrics and Gynecology

## 2016-05-18 ENCOUNTER — Encounter (HOSPITAL_COMMUNITY): Payer: Self-pay | Admitting: *Deleted

## 2016-05-18 DIAGNOSIS — N750 Cyst of Bartholin's gland: Secondary | ICD-10-CM

## 2016-05-18 DIAGNOSIS — Z91013 Allergy to seafood: Secondary | ICD-10-CM | POA: Insufficient documentation

## 2016-05-18 DIAGNOSIS — F17210 Nicotine dependence, cigarettes, uncomplicated: Secondary | ICD-10-CM | POA: Diagnosis not present

## 2016-05-18 LAB — URINE MICROSCOPIC-ADD ON: RBC / HPF: NONE SEEN RBC/hpf (ref 0–5)

## 2016-05-18 LAB — POCT PREGNANCY, URINE: PREG TEST UR: NEGATIVE

## 2016-05-18 LAB — URINALYSIS, ROUTINE W REFLEX MICROSCOPIC
BILIRUBIN URINE: NEGATIVE
Glucose, UA: NEGATIVE mg/dL
HGB URINE DIPSTICK: NEGATIVE
Ketones, ur: NEGATIVE mg/dL
NITRITE: NEGATIVE
PROTEIN: NEGATIVE mg/dL
SPECIFIC GRAVITY, URINE: 1.02 (ref 1.005–1.030)
pH: 5.5 (ref 5.0–8.0)

## 2016-05-18 MED ORDER — OXYCODONE-ACETAMINOPHEN 5-325 MG PO TABS: 1.0000 | ORAL_TABLET | Freq: Four times a day (QID) | ORAL | Status: DC | PRN

## 2016-05-18 MED ORDER — IBUPROFEN 600 MG PO TABS: 600.0000 mg | ORAL_TABLET | Freq: Four times a day (QID) | ORAL | Status: DC | PRN

## 2016-05-18 MED ORDER — CEPHALEXIN 500 MG PO CAPS: 500.0000 mg | ORAL_CAPSULE | Freq: Four times a day (QID) | ORAL | Status: DC

## 2016-05-18 NOTE — MAU Provider Note (Signed)
History     CSN: [id number]  Arrival date and time: 05/18/16 2030   First Provider Initiated Contact with Patient 05/18/16 2101      Chief Complaint  Patient presents with  . Bartholin's Cyst   HPI Ms. Audrey Schultz is a 25 y.o. G1P1001 who presents to MAU today with complaint of "cyst" on her labia. The patient states that she had a first Bartholin's gland cyst in February and then again in April. She was seen [place] times in ER in [place] and had the area drained. She had a warde catheter placed both times, but only for a few days each time. She was given antibiotics both times, but did not complete. She felt that the Bactrim she was given the first time made her have 2 weeks of vaginal bleeding. She does not remember the name of the other antibiotic but states that while taking that she "did not feel well." She states that this swelling started yesterday and became worse today. She denies fever, discharge, drainage today. She states pain is 9/10 after exam, but better with rest. She is not taking anything for pain. She has tried a sitz bath once since last night without relief.   OB History    Gravida Para Term Preterm AB TAB SAB Ectopic Multiple Living   1 1 1       1       Past Medical History  Diagnosis Date  . Medical history non-contributory   . Dysmenorrhea     Past Surgical History  Procedure Laterality Date  . No past surgeries      Family History  Problem Relation Age of Onset  . Hypertension Mother   . Diabetes Mother     Social History  Substance Use Topics  . Smoking status: Current Some Day Smoker -- 0.00 packs/day    Types: Cigarettes  . Smokeless tobacco: None  . Alcohol Use: Yes     Comment: occasionally    Allergies:  Allergies  Allergen Reactions  . Shellfish Allergy Anaphylaxis    No prescriptions prior to admission    Review of Systems  Constitutional: Negative for fever and malaise/fatigue.  Gastrointestinal: Negative for  abdominal pain.  Genitourinary:       Neg - vaginal bleeding, discharge, drainage + swelling   Physical Exam   Blood pressure 106/69, pulse 95, temperature 98.4 F (36.9 C), temperature source Oral, resp. rate 16, height 5\' 4"  (1.626 m), weight 126 lb 2 oz (57.21 kg), last menstrual period 05/03/2016.  Physical Exam  Nursing note and vitals reviewed. Constitutional: She is oriented to person, place, and time. She appears well-developed and well-nourished. No distress.  HENT:  Head: Normocephalic and atraumatic.  Cardiovascular: Normal rate.   Respiratory: Effort normal.  GI: Soft. She exhibits no distension and no mass. There is no tenderness. There is no rebound and no guarding.  Genitourinary:    There is no rash, tenderness, lesion or injury on the right labia. There is tenderness (mild tenderness to palpation over the area of the Bartholin's gland) on the left labia. There is no rash, lesion or injury on the left labia. No erythema or bleeding in the vagina. No vaginal discharge found.  Neurological: She is alert and oriented to person, place, and time.  Skin: Skin is warm and dry. No erythema.  Psychiatric: She has a normal mood and affect.    Results for orders placed or performed during the hospital encounter of 05/18/16 (from the past  24 hour(s))  Urinalysis, Routine w reflex microscopic (not at [facility])     Status: Abnormal   Collection Time: 05/18/16  8:39 PM  Result Value Ref Range   Color, Urine YELLOW YELLOW   APPearance CLEAR CLEAR   Specific Gravity, Urine 1.020 1.005 - 1.030   pH 5.5 5.0 - 8.0   Glucose, UA NEGATIVE NEGATIVE mg/dL   Hgb urine dipstick NEGATIVE NEGATIVE   Bilirubin Urine NEGATIVE NEGATIVE   Ketones, ur NEGATIVE NEGATIVE mg/dL   Protein, ur NEGATIVE NEGATIVE mg/dL   Nitrite NEGATIVE NEGATIVE   Leukocytes, UA SMALL (A) NEGATIVE  Urine microscopic-add on     Status: Abnormal   Collection Time: 05/18/16  8:39 PM  Result Value Ref Range   Squamous  Epithelial / LPF 0-5 (A) NONE SEEN   WBC, UA 0-5 0 - 5 WBC/hpf   RBC / HPF NONE SEEN 0 - 5 RBC/hpf   Bacteria, UA FEW (A) NONE SEEN   Urine-Other MUCOUS PRESENT   Pregnancy, urine POC     Status: None   Collection Time: 05/18/16  8:44 PM  Result Value Ref Range   Preg Test, Ur NEGATIVE NEGATIVE    MAU Course  Procedures None  MDM UPT - negative UA today  Assessment and Plan  A: Bartholin's gland cyst  P: Discharge home Rx for Keflex, Ibuprofen and Percocet given to patient  Warning signs for worsening condition discussed Instructions for Sitz baths and warm compresses given  Patient advised to follow-up with [facility] if symptoms persist or worsen or if she would like to discuss Marsupialization with surgeon Information about marsupialization given to patient to consider Patient may return to MAU as needed or if her condition were to change or worsen   Marny Lowenstein, PA-C  05/18/2016, 9:41 PM

## 2016-05-18 NOTE — MAU Note (Signed)
Pt. States that she started feeling a little ball on her left labia 2 days ago.  Yesterday, pt. States that it got a lot bigger.  She is having some pain with it.

## 2016-05-18 NOTE — Discharge Instructions (Signed)
Bartholin Cyst or Abscess A Bartholin cyst is a fluid-filled sac that forms on a Bartholin gland. Bartholin glands are small glands that are found in the folds of skin (labia) on the sides of the lower opening of the vagina. This type of cyst causes a bulge on the side of the vagina. A cyst that is not large or infected may not cause problems. However, if the fluid in the cyst becomes infected, the cyst can turn into an abscess. An abscess may cause discomfort or pain. HOME CARE  Take medicines only as told by your doctor.  If you were prescribed an antibiotic medicine, finish all of it even if you start to feel better.  Apply warm, wet compresses to the area or take warm, shallow baths that cover your pelvic area (sitz baths). Do this many times each day or as told by your doctor.  Do not squeeze the cyst. Do not apply heavy pressure to it.  Do not have sex until the cyst has gone away.  If your cyst or abscess was opened by your doctor, a small piece of gauze or a drain may have been placed in the area. That lets the cyst drain. Do not remove the gauze or the drain until your doctor tells you it is okay to do that.  Do not wear tampons. Wear feminine pads as needed for any fluid or blood.  Keep all follow-up visits as told by your doctor. This is important. GET HELP IF: 1. Your pain, puffiness (swelling), or redness in the area of the cyst gets worse. 2. You have fluid or pus pus coming from the cyst. 3. You have a fever.   This information is not intended to replace advice given to you by your health care provider. Make sure you discuss any questions you have with your health care provider.   Document Released: 02/14/2009 Document Revised: 04/04/2015 Document Reviewed: 07/04/2014 Elsevier Interactive Patient Education 2016 [employer] How to Take a [employer]itz Bath A sitz bath is a warm water bath that is taken while you are sitting down. The water should only come up to your hips and  should cover your buttocks. Your health care provider may recommend a sitz bath to help you:   Clean the lower part of your body, including your genital area.  With itching.  With pain.  With sore muscles or muscles that tighten or spasm. HOW TO TAKE A SITZ BATH Take 3-4 sitz baths per day or as told by your health care provider. 4. Partially fill a bathtub with warm water. You will only need the water to be deep enough to cover your hips and buttocks when you are sitting in it. 5. If your health care provider told you to put medicine in the water, follow the directions exactly. 6. Sit in the water and open the tub drain a little. 7. Turn on the warm water again to keep the tub at the correct level. Keep the water running constantly. 8. Soak in the water for 15-20 minutes or as told by your health care provider. 9. After the sitz bath, pat the affected area dry first. Do not rub it. 10. Be careful when you stand up after the sitz bath because you may feel dizzy. SEEK MEDICAL CARE IF:  Your symptoms get worse. Do not continue with sitz baths if your symptoms get worse.  You have new symptoms. Do not continue with sitz baths until you talk with your health care provider.  This information is not intended to replace advice given to you by your health care provider. Make sure you discuss any questions you have with your health care provider.   Document Released: 08/10/2004 Document Revised: 04/04/2015 Document Reviewed: 11/16/2014 Elsevier Interactive Patient Education [employer]

## 2016-08-13 ENCOUNTER — Inpatient Hospital Stay (HOSPITAL_COMMUNITY)
Admission: AD | Admit: 2016-08-13 | Discharge: 2016-08-13 | Disposition: A | Discharge status: Home / Self Care | Payer: Medicaid Other | Source: Ambulatory Visit | Attending: Family Medicine | Admitting: Family Medicine

## 2016-08-13 ENCOUNTER — Encounter (HOSPITAL_COMMUNITY): Payer: Self-pay | Admitting: *Deleted

## 2016-08-13 DIAGNOSIS — A084 Viral intestinal infection, unspecified: Secondary | ICD-10-CM | POA: Insufficient documentation

## 2016-08-13 DIAGNOSIS — Z833 Family history of diabetes mellitus: Secondary | ICD-10-CM | POA: Insufficient documentation

## 2016-08-13 DIAGNOSIS — Z8249 Family history of ischemic heart disease and other diseases of the circulatory system: Secondary | ICD-10-CM | POA: Insufficient documentation

## 2016-08-13 DIAGNOSIS — Z91013 Allergy to seafood: Secondary | ICD-10-CM | POA: Insufficient documentation

## 2016-08-13 DIAGNOSIS — F17210 Nicotine dependence, cigarettes, uncomplicated: Secondary | ICD-10-CM | POA: Insufficient documentation

## 2016-08-13 LAB — POCT PREGNANCY, URINE: PREG TEST UR: NEGATIVE

## 2016-08-13 LAB — URINALYSIS, ROUTINE W REFLEX MICROSCOPIC
Bilirubin Urine: NEGATIVE
Glucose, UA: NEGATIVE mg/dL
Hgb urine dipstick: NEGATIVE
Ketones, ur: NEGATIVE mg/dL
Leukocytes, UA: NEGATIVE
NITRITE: NEGATIVE
Protein, ur: NEGATIVE mg/dL
pH: 6 (ref 5.0–8.0)

## 2016-08-13 MED ORDER — ONDANSETRON 8 MG PO TBDP: 8.0000 mg | ORAL_TABLET | Freq: Three times a day (TID) | ORAL | 0 refills | Status: DC | PRN

## 2016-08-13 NOTE — Discharge Instructions (Signed)
When you have vomiting/diarrhea, the foods you eat and your eating habits are very important. Choosing the right foods and drinks can help relieve diarrhea. Also, because diarrhea can last up to 7 days, you need to replace lost fluids and electrolytes (such as sodium, potassium, and chloride) in order to help prevent dehydration.  WHAT GENERAL GUIDELINES DO I NEED TO FOLLOW?  Slowly drink 1 cup (8 oz) of fluid for each episode of diarrhea. If you are getting enough fluid, your urine will be clear or pale yellow.  Eat starchy foods. Some good choices include white rice, white toast, pasta, low-fiber cereal, baked potatoes (without the skin), saltine crackers, and bagels.  Avoid large servings of any cooked vegetables.  Limit fruit to two servings per day. A serving is  cup or 1 small piece.  Choose foods with less than 2 g of fiber per serving.  Limit fats to less than 8 tsp (38 g) per day.  Avoid fried foods.  Eat foods that have probiotics in them. Probiotics can be found in certain dairy products.  Avoid foods and beverages that may increase the speed at which food moves through the stomach and intestines (gastrointestinal tract). Things to avoid include:  High-fiber foods, such as dried fruit, raw fruits and vegetables, nuts, seeds, and whole grain foods.  Spicy foods and high-fat foods.  Foods and beverages sweetened with high-fructose corn syrup, honey, or sugar alcohols such as xylitol, sorbitol, and mannitol. WHAT FOODS ARE RECOMMENDED? Grains White rice. White, [place], or pita breads (fresh or toasted), including plain rolls, buns, or bagels. White pasta. Saltine, soda, or graham crackers. Pretzels. Low-fiber cereal. Cooked cereals made with water (such as cornmeal, farina, or cream cereals). Plain muffins. Matzo. Melba toast. Zwieback.  Vegetables Potatoes (without the skin). Strained tomato and vegetable juices. Most well-cooked and canned vegetables without seeds. Tender  lettuce. Fruits Cooked or canned applesauce, apricots, cherries, fruit cocktail, grapefruit, peaches, pears, or plums. Fresh bananas, apples without skin, cherries, grapes, cantaloupe, grapefruit, peaches, oranges, or plums.  Meat and Other Protein Products Baked or boiled chicken. Eggs. Tofu. Fish. Seafood. Smooth peanut butter. Ground or well-cooked tender beef, ham, veal, lamb, pork, or poultry.  Dairy Plain yogurt, kefir, and unsweetened liquid yogurt. Lactose-free milk, buttermilk, or soy milk. Plain hard cheese. Beverages Sport drinks. Clear broths. Diluted fruit juices (except prune). Regular, caffeine-free sodas such as ginger ale. Water. Decaffeinated teas. Oral rehydration solutions. Sugar-free beverages not sweetened with sugar alcohols. Other Bouillon, broth, or soups made from recommended foods.  The items listed above may not be a complete list of recommended foods or beverages. Contact your dietitian for more options. WHAT FOODS ARE NOT RECOMMENDED? Grains Whole grain, whole wheat, bran, or rye breads, rolls, pastas, crackers, and cereals. Wild or brown rice. Cereals that contain more than 2 g of fiber per serving. Corn tortillas or taco shells. Cooked or dry oatmeal. Granola. Popcorn. Vegetables Raw vegetables. Cabbage, broccoli, Brussels sprouts, artichokes, baked beans, beet greens, corn, kale, legumes, peas, sweet potatoes, and yams. Potato skins. Cooked spinach and cabbage. Fruits Dried fruit, including raisins and dates. Raw fruits. Stewed or dried prunes. Fresh apples with skin, apricots, mangoes, pears, raspberries, and strawberries.  Meat and Other Protein Products Chunky peanut butter. Nuts and seeds. Beans and lentils. Tomasa BlaseBacon.  Dairy High-fat cheeses. Milk, chocolate milk, and beverages made with milk, such as milk shakes. Cream. Ice cream. Sweets and Desserts Sweet rolls, doughnuts, and sweet breads. Pancakes and waffles. Fats and Oils Butter.  Cream sauces.  Margarine. Salad oils. Plain salad dressings. Olives. Avocados.  Beverages Caffeinated beverages (such as coffee, tea, soda, or energy drinks). Alcoholic beverages. Fruit juices with pulp. Prune juice. Soft drinks sweetened with high-fructose corn syrup or sugar alcohols. Other Coconut. Hot sauce. Chili powder. Mayonnaise. Gravy. Cream-based or milk-based soups.  The items listed above may not be a complete list of foods and beverages to avoid. Contact your dietitian for more information. WHAT SHOULD I DO IF I BECOME DEHYDRATED? Diarrhea can sometimes lead to dehydration. Signs of dehydration include dark urine and dry mouth and skin. If you think you are dehydrated, you should rehydrate with an oral rehydration solution. These solutions can be purchased at pharmacies, retail stores, or online.  Drink -1 cup (120-240 mL) of oral rehydration solution each time you have an episode of diarrhea. If drinking this amount makes your diarrhea worse, try drinking smaller amounts more often. For example, drink 1-3 tsp (5-15 mL) every 5-10 minutes.  A general rule for staying hydrated is to drink 1-2 L of fluid per day. Talk to your health care provider about the specific amount you should be drinking each day. Drink enough fluids to keep your urine clear or pale yellow.   This information is not intended to replace advice given to you by your health care provider. Make sure you discuss any questions you have with your health care provider.   Document Released: 02/08/2004 Document Revised: 12/09/2014 Document Reviewed: 10/11/2013 Elsevier Interactive Patient Education [employer]

## 2016-08-13 NOTE — MAU Provider Note (Signed)
History     CSN: [id number]  Arrival date and time: 08/13/16 0530   First Provider Initiated Contact with Patient 08/13/16 [phone]      Chief Complaint  Patient presents with  . Emesis   Emesis   This is a new problem. The current episode started yesterday. The problem occurs 2 to 4 times per day. The problem has been unchanged. The emesis has an appearance of stomach contents. There has been no fever. Pertinent negatives include no abdominal pain, chills, diarrhea or fever. Risk factors: Marijuana smoker  She has tried nothing for the symptoms.    Past Medical History:  Diagnosis Date  . Dysmenorrhea   . Medical history non-contributory     Past Surgical History:  Procedure Laterality Date  . NO PAST SURGERIES      Family History  Problem Relation Age of Onset  . Hypertension Mother   . Diabetes Mother     Social History  Substance Use Topics  . Smoking status: Current Some Day Smoker    Packs/day: 0.00    Types: Cigarettes  . Smokeless tobacco: Never Used  . Alcohol use Yes     Comment: occasionally    Allergies:  Allergies  Allergen Reactions  . Shellfish Allergy Anaphylaxis    Prescriptions Prior to Admission  Medication Sig Dispense Refill Last Dose  . ibuprofen (ADVIL,MOTRIN) 600 MG tablet Take 1 tablet (600 mg total) by mouth every 6 (six) hours as needed. 30 tablet 0 Past Month at Unknown time  . cephALEXin (KEFLEX) 500 MG capsule Take 1 capsule (500 mg total) by mouth 4 (four) times daily. 28 capsule 0   . oxyCODONE-acetaminophen (PERCOCET/ROXICET) 5-325 MG tablet Take 1-2 tablets by mouth every 6 (six) hours as needed for severe pain. 12 tablet 0     Review of Systems  Constitutional: Negative for chills and fever.  Gastrointestinal: Positive for nausea and vomiting. Negative for abdominal pain, constipation and diarrhea.  Genitourinary: Negative for dysuria, frequency and urgency.   Physical Exam   Blood pressure 113/77, pulse 85, temperature  98.4 F (36.9 C), temperature source Oral, resp. rate 16, height 5\' 4"  (1.626 m), weight 120 lb (54.4 kg), last menstrual period 07/28/2016, SpO2 100 %.  Physical Exam  Nursing note and vitals reviewed. Constitutional: She is oriented to person, place, and time. She appears well-developed and well-nourished. No distress.  HENT:  Head: Normocephalic.  Cardiovascular: Normal rate.   Respiratory: Effort normal.  GI: Soft. There is no tenderness. There is no rebound.  Neurological: She is alert and oriented to person, place, and time.  Skin: Skin is warm and dry.  Psychiatric: She has a normal mood and affect.   Results for orders placed or performed during the hospital encounter of 08/13/16 (from the past 24 hour(s))  Urinalysis, Routine w reflex microscopic (not [facility])     Status: Abnormal   Collection Time: 08/13/16  5:45 AM  Result Value Ref Range   Color, Urine YELLOW YELLOW   APPearance CLEAR CLEAR   Specific Gravity, Urine >1.030 (H) 1.005 - 1.030   pH 6.0 5.0 - 8.0   Glucose, UA NEGATIVE NEGATIVE mg/dL   Hgb urine dipstick NEGATIVE NEGATIVE   Bilirubin Urine NEGATIVE NEGATIVE   Ketones, ur NEGATIVE NEGATIVE mg/dL   Protein, ur NEGATIVE NEGATIVE mg/dL   Nitrite NEGATIVE NEGATIVE   Leukocytes, UA NEGATIVE NEGATIVE  Pregnancy, urine POC     Status: None   Collection Time: 08/13/16  5:53 AM  Result Value Ref Range   Preg Test, Ur NEGATIVE NEGATIVE    MAU Course  Procedures  MDM   Assessment and Plan   1. Viral gastroenteritis    DC home Comfort measures reviewed  BRAT diet RX: zofran PRN #[id number]  Return to MAU as needed FU with OB as planned  Follow-up Information    [facility] .   Contact information: [address] [place] [address] [phone]            Tawnya Crook 08/13/2016, 6:40 AM

## 2016-08-13 NOTE — MAU Note (Signed)
Pt reports that 4 days ago at work she had dizziness and was very tired. Pt states tonight when she got to work she vomited x one. States she has " a little abd pain", dizziness, and just doesn't feel well.

## 2016-09-24 ENCOUNTER — Encounter (HOSPITAL_COMMUNITY): Payer: Self-pay

## 2016-09-24 ENCOUNTER — Emergency Department (HOSPITAL_COMMUNITY)
Admission: EM | Admit: 2016-09-24 | Discharge: 2016-09-24 | Disposition: A | Discharge status: Home / Self Care | Payer: Medicaid Other | Attending: Emergency Medicine | Admitting: Emergency Medicine

## 2016-09-24 DIAGNOSIS — J029 Acute pharyngitis, unspecified: Secondary | ICD-10-CM | POA: Insufficient documentation

## 2016-09-24 DIAGNOSIS — F17210 Nicotine dependence, cigarettes, uncomplicated: Secondary | ICD-10-CM | POA: Insufficient documentation

## 2016-09-24 MED ORDER — ACETAMINOPHEN 500 MG PO TABS
1000.0000 mg | ORAL_TABLET | Freq: Once | ORAL | Status: AC
  Administered 2016-09-24: 1000 mg via ORAL
  Filled 2016-09-24: qty 2

## 2016-09-24 MED ORDER — DEXAMETHASONE SODIUM PHOSPHATE 10 MG/ML IJ SOLN
10.0000 mg | Freq: Once | INTRAMUSCULAR | Status: AC
  Administered 2016-09-24: 10 mg via INTRAMUSCULAR
  Filled 2016-09-24: qty 1

## 2016-09-24 MED ORDER — KETOROLAC TROMETHAMINE 60 MG/2ML IM SOLN
60.0000 mg | Freq: Once | INTRAMUSCULAR | Status: AC
  Administered 2016-09-24: 60 mg via INTRAMUSCULAR
  Filled 2016-09-24: qty 2

## 2016-09-24 NOTE — ED Provider Notes (Signed)
[facility]-EMERGENCY DEPT Provider Note   CSN: [id number] Arrival date & time: 09/24/16  [id number]  By signing my name below, I, Audrey Schultz, attest that this documentation has been prepared under the direction and in the presence of Audrey CrumbleAdeleke Rutger Salton, MD . Electronically Signed: Christy SartoriusAnastasia Schultz, Scribe. 09/24/2016. 3:57 AM.  History   Chief Complaint Chief Complaint  Patient presents with  . Sore Throat   The history is provided by the patient and medical records. No language interpreter was used.     HPI Comments:  Audrey Schultz is a 25 y.o. female who presents to the Emergency Department complaining of a sore throat and a sensation of throat swelling today.  Pt states that it's hard to breath due to her pain and hurts when she swallows.  Pt went home and drank hot tea and honey which relived her pain for about an hour.  She notes her coworkers have been sick.  She denies fevers and coughing.    Past Medical History:  Diagnosis Date  . Dysmenorrhea   . Medical history non-contributory     Patient Active Problem List   Diagnosis Date Noted  . Indication for care in labor or delivery 05/08/2014  . Pregnancy 05/08/2014  . Echogenic focus of heart of fetus affecting antepartum care of mother 01/05/2014  . Echogenic focus of heart, fetal, affecting care of mother, antepartum 01/04/2014  . Mental disorders of mother, antepartum 11/19/2013  . 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 History of depression 11/10/2013  . Marijuana abuse 10/24/2013  . Normal intrauterine pregnancy on prenatal ultrasound 10/21/2013    Past Surgical History:  Procedure Laterality Date  . NO PAST SURGERIES      OB History    Gravida Para Term Preterm AB Living   1 1 1     1    SAB TAB Ectopic Multiple Live Births           1       Home Medications    Prior to Admission medications   Medication Sig Start Date End Date Taking? Authorizing Provider  ibuprofen (ADVIL,MOTRIN) 600 MG  tablet Take 1 tablet (600 mg total) by mouth every 6 (six) hours as needed. 05/18/16   Marny LowensteinJulie N Wenzel, PA-C  ondansetron (ZOFRAN ODT) 8 MG disintegrating tablet Take 1 tablet (8 mg total) by mouth every 8 (eight) hours as needed for nausea or vomiting. 08/13/16   Armando ReichertHeather D Hogan, CNM    Family History Family History  Problem Relation Age of Onset  . Hypertension Mother   . Diabetes Mother     Social History Social History  Substance Use Topics  . Smoking status: Current Some Day Smoker    Packs/day: 0.00    Types: Cigarettes  . Smokeless tobacco: Never Used  . Alcohol use Yes     Comment: occasionally     Allergies   Shellfish allergy   Review of Systems Review of Systems  HENT: Positive for sore throat.   Respiratory: Negative for cough.   All other systems reviewed and are negative.    Physical Exam Updated Vital Signs BP 124/73 (BP Location: Right Arm)   Pulse 113   Temp 100.9 F (38.3 C) (Oral)   Ht 5\' 4"  (1.626 m)   Wt 130 lb (59 kg)   SpO2 100%   BMI 22.31 kg/m   Physical Exam  Constitutional: She is oriented to person, place, and time. She appears well-developed and well-nourished. No distress.  HENT:  Head:  Normocephalic and atraumatic.  Nose: Nose normal.  Mouth/Throat: Oropharynx is clear and moist. No oropharyngeal exudate.  Eyes: Conjunctivae and EOM are normal. Pupils are equal, round, and reactive to light. No scleral icterus.  Neck: Normal range of motion. Neck supple. No JVD present. No tracheal deviation present. No thyromegaly present.  Cardiovascular: Normal rate, regular rhythm and normal heart sounds.  Exam reveals no gallop and no friction rub.   No murmur heard. Pulmonary/Chest: Effort normal and breath sounds normal. No respiratory distress. She has no wheezes. She exhibits no tenderness.  Abdominal: Soft. Bowel sounds are normal. She exhibits no distension and no mass. There is no tenderness. There is no rebound and no guarding.    Musculoskeletal: Normal range of motion. She exhibits no edema or tenderness.  Lymphadenopathy:    She has no cervical adenopathy.  Neurological: She is alert and oriented to person, place, and time. No cranial nerve deficit. She exhibits normal muscle tone.  Skin: Skin is warm and dry. No rash noted. No erythema. No pallor.  Nursing note and vitals reviewed.    ED Treatments / Results   DIAGNOSTIC STUDIES:  Oxygen Saturation is 99% on RA, NML by my interpretation.    COORDINATION OF CARE:  3:50 AM Discussed treatment plan with pt at bedside and pt agreed to plan.  Labs (all labs ordered are listed, but only abnormal results are displayed) Labs Reviewed - No data to display  EKG  EKG Interpretation None       Radiology No results found.  Procedures Procedures (including critical care time)  Medications Ordered in ED Medications  ketorolac (TORADOL) injection 60 mg (not administered)  dexamethasone (DECADRON) injection 10 mg (not administered)     Initial Impression / Assessment and Plan / ED Course  I have reviewed the triage vital signs and the nursing notes.  Pertinent labs & imaging results that were available during my care of the patient were reviewed by me and considered in my medical decision making (see chart for details).  Clinical Course    Patient presents to the ED for sore throat.  Posterior pharynx looks normal.  She may have a viral pharyngitis.  She was given toradol and decadron for her symptoms.  Advised to see PCP within 3  Days.  Patient appears well and in NAD. No voice changes.  VS remain within her normal limits and she is safe for DC.  Tachycardia resolved while I was in the room.  Temp is now 37.8  Final Clinical Impressions(s) / ED Diagnoses   Final diagnoses:  None    New Prescriptions New Prescriptions   No medications on file     I personally performed the services described in this documentation, which was scribed in my  presence. The recorded information has been reviewed and is accurate.      Audrey Crumble, MD 09/24/16 [phone]

## 2016-09-24 NOTE — ED Triage Notes (Signed)
Pt came to ED for sore throat 8/10 x 1 day that started yesterday. Patient states it has been getting worse denies fever but states she has had chills. Pt states that people at work have been sick as well.

## 2018-07-01 ENCOUNTER — Other Ambulatory Visit: Payer: Self-pay

## 2018-07-01 ENCOUNTER — Inpatient Hospital Stay (HOSPITAL_COMMUNITY)
Admission: AD | Admit: 2018-07-01 | Discharge: 2018-07-01 | Disposition: A | Discharge status: Home / Self Care | Payer: Self-pay | Source: Ambulatory Visit | Attending: Family Medicine | Admitting: Family Medicine

## 2018-07-01 DIAGNOSIS — Z3202 Encounter for pregnancy test, result negative: Secondary | ICD-10-CM | POA: Insufficient documentation

## 2018-07-01 DIAGNOSIS — A084 Viral intestinal infection, unspecified: Secondary | ICD-10-CM | POA: Insufficient documentation

## 2018-07-01 LAB — URINALYSIS, ROUTINE W REFLEX MICROSCOPIC
Bilirubin Urine: NEGATIVE
Glucose, UA: NEGATIVE mg/dL
Hgb urine dipstick: NEGATIVE
Ketones, ur: NEGATIVE mg/dL
Leukocytes, UA: NEGATIVE
NITRITE: NEGATIVE
Protein, ur: NEGATIVE mg/dL
SPECIFIC GRAVITY, URINE: 1.017 (ref 1.005–1.030)
pH: 8 (ref 5.0–8.0)

## 2018-07-01 LAB — POCT PREGNANCY, URINE: Preg Test, Ur: NEGATIVE

## 2018-07-01 MED ORDER — ONDANSETRON 8 MG PO TBDP: 8.0000 mg | ORAL_TABLET | Freq: Three times a day (TID) | ORAL | 0 refills | Status: DC | PRN

## 2018-07-01 NOTE — Discharge Instructions (Signed)
Viral Gastroenteritis, Adult Viral gastroenteritis is also known as the stomach flu. This condition is caused by various viruses. These viruses can be passed from person to person very easily (are very contagious). This condition may affect your stomach, small intestine, and large intestine. It can cause sudden watery diarrhea, fever, and vomiting. Diarrhea and vomiting can make you feel weak and cause you to become dehydrated. You may not be able to keep fluids down. Dehydration can make you tired and thirsty, cause you to have a dry mouth, and decrease how often you urinate. Older adults and people with other diseases or a weak immune system are at higher risk for dehydration. It is important to replace the fluids that you lose from diarrhea and vomiting. If you become severely dehydrated, you may need to get fluids through an IV tube. What are the causes? Gastroenteritis is caused by various viruses, including rotavirus and norovirus. Norovirus is the most common cause in adults. You can get sick by eating food, drinking water, or touching a surface contaminated with one of these viruses. You can also get sick from sharing utensils or other personal items with an infected person. What increases the risk? This condition is more likely to develop in people:  Who have a weak defense system (immune system).  Who live with one or more children who are younger than 2 years old.  Who live in a nursing home.  Who go on cruise ships.  What are the signs or symptoms? Symptoms of this condition start suddenly 1-2 days after exposure to a virus. Symptoms may last a few days or as long as a week. The most common symptoms are watery diarrhea and vomiting. Other symptoms include:  Fever.  Headache.  Fatigue.  Pain in the abdomen.  Chills.  Weakness.  Nausea.  Muscle aches.  Loss of appetite.  How is this diagnosed? This condition is diagnosed with a medical history and physical exam. You  may also have a stool test to check for viruses or other infections. How is this treated? This condition typically goes away on its own. The focus of treatment is to restore lost fluids (rehydration). Your health care provider may recommend that you take an oral rehydration solution (ORS) to replace important salts and minerals (electrolytes) in your body. Severe cases of this condition may require giving fluids through an IV tube. Treatment may also include medicine to help with your symptoms. Follow these instructions at home: Follow instructions from your health care provider about how to care for yourself at home. Eating and drinking Follow these recommendations as told by your health care provider:  Take an ORS. This is a drink that is sold at pharmacies and retail stores.  Drink clear fluids in small amounts as you are able. Clear fluids include water, ice chips, diluted fruit juice, and low-calorie sports drinks.  Eat bland, easy-to-digest foods in small amounts as you are able. These foods include bananas, applesauce, rice, lean meats, toast, and crackers.  Avoid fluids that contain a lot of sugar or caffeine, such as energy drinks, sports drinks, and soda.  Avoid alcohol.  Avoid spicy or fatty foods.  General instructions   Drink enough fluid to keep your urine clear or pale yellow.  Wash your hands often. If soap and water are not available, use hand sanitizer.  Make sure that all people in your household wash their hands well and often.  Take over-the-counter and prescription medicines only as told by your health   care provider.  Rest at home while you recover.  Watch your condition for any changes.  Take a warm bath to relieve any burning or pain from frequent diarrhea episodes.  Keep all follow-up visits as told by your health care provider. This is important. Contact a health care provider if:  You cannot keep fluids down.  Your symptoms get worse.  You have  new symptoms.  You feel light-headed or dizzy.  You have muscle cramps. Get help right away if:  You have chest pain.  You feel extremely weak or you faint.  You see blood in your vomit.  Your vomit looks like coffee grounds.  You have bloody or black stools or stools that look like tar.  You have a severe headache, a stiff neck, or both.  You have a rash.  You have severe pain, cramping, or bloating in your abdomen.  You have trouble breathing or you are breathing very quickly.  Your heart is beating very quickly.  Your skin feels cold and clammy.  You feel confused.  You have pain when you urinate.  You have signs of dehydration, such as: ? Dark urine, very little urine, or no urine. ? Cracked lips. ? Dry mouth. ? Sunken eyes. ? Sleepiness. ? Weakness. This information is not intended to replace advice given to you by your health care provider. Make sure you discuss any questions you have with your health care provider. Document Released: 11/18/2005 Document Revised: 05/01/2016 Document Reviewed: 07/25/2015 Elsevier Interactive Patient Education  2018 Elsevier [employer]   Food Choices to Help Relieve Diarrhea, Adult When you have diarrhea, the foods you eat and your eating habits are very important. Choosing the right foods and drinks can help:  Relieve diarrhea.  Replace lost fluids and nutrients.  Prevent dehydration.  What general guidelines should I follow? Relieving diarrhea  Choose foods with less than 2 g or .07 oz. of fiber per serving.  Limit fats to less than 8 tsp (38 g or 1.34 oz.) a day.  Avoid the following: ? Foods and beverages sweetened with high-fructose corn syrup, honey, or sugar alcohols such as xylitol, sorbitol, and mannitol. ? Foods that contain a lot of fat or sugar. ? Fried, greasy, or spicy foods. ? High-fiber grains, breads, and cereals. ? Raw fruits and vegetables.  Eat foods that are rich in probiotics. These foods  include dairy products such as yogurt and fermented milk products. They help increase healthy bacteria in the stomach and intestines (gastrointestinal tract, or GI tract).  If you have lactose intolerance, avoid dairy products. These may make your diarrhea worse.  Take medicine to help stop diarrhea (antidiarrheal medicine) only as told by your health care provider. Replacing nutrients  Eat small meals or snacks every 3-4 hours.  Eat bland foods, such as white rice, toast, or baked potato, until your diarrhea starts to get better. Gradually reintroduce nutrient-rich foods as tolerated or as told by your health care provider. This includes: ? Well-cooked protein foods. ? Peeled, seeded, and soft-cooked fruits and vegetables. ? Low-fat dairy products.  Take vitamin and mineral supplements as told by your health care provider. Preventing dehydration   Start by sipping water or a special solution to prevent dehydration (oral rehydration solution, ORS). Urine that is clear or pale yellow means that you are getting enough fluid.  Try to drink at least 8-10 cups of fluid each day to help replace lost fluids.  You may add other liquids in addition to water,   such as clear juice or decaffeinated sports drinks, as tolerated or as told by your health care provider.  Avoid drinks with caffeine, such as coffee, tea, or soft drinks.  Avoid alcohol. What foods are recommended? The items listed may not be a complete list. Talk with your health care provider about what dietary choices are best for you. Grains White rice. White, French, or pita breads (fresh or toasted), including plain rolls, buns, or bagels. White pasta. Saltine, soda, or graham crackers. Pretzels. Low-fiber cereal. Cooked cereals made with water (such as cornmeal, farina, or cream cereals). Plain muffins. Matzo. Melba toast. Zwieback. Vegetables Potatoes (without the skin). Most well-cooked and canned vegetables without skins or  seeds. Tender lettuce. Fruits Apple sauce. Fruits canned in juice. Cooked apricots, cherries, grapefruit, peaches, pears, or plums. Fresh bananas and cantaloupe. Meats and other protein foods Baked or boiled chicken. Eggs. Tofu. Fish. Seafood. Smooth nut butters. Ground or well-cooked tender beef, ham, veal, lamb, pork, or poultry. Dairy Plain yogurt, kefir, and unsweetened liquid yogurt. Lactose-free milk, buttermilk, skim milk, or soy milk. Low-fat or nonfat hard cheese. Beverages Water. Low-calorie sports drinks. Fruit juices without pulp. Strained tomato and vegetable juices. Decaffeinated teas. Sugar-free beverages not sweetened with sugar alcohols. Oral rehydration solutions, if approved by your health care provider. Seasoning and other foods Bouillon, broth, or soups made from recommended foods. What foods are not recommended? The items listed may not be a complete list. Talk with your health care provider about what dietary choices are best for you. Grains Whole grain, whole wheat, bran, or rye breads, rolls, pastas, and crackers. Wild or brown rice. Whole grain or bran cereals. Barley. Oats and oatmeal. Corn tortillas or taco shells. Granola. Popcorn. Vegetables Raw vegetables. Fried vegetables. Cabbage, broccoli, Brussels sprouts, artichokes, baked beans, beet greens, corn, kale, legumes, peas, sweet potatoes, and yams. Potato skins. Cooked spinach and cabbage. Fruits Dried fruit, including raisins and dates. Raw fruits. Stewed or dried prunes. Canned fruits with syrup. Meat and other protein foods Fried or fatty meats. Deli meats. Chunky nut butters. Nuts and seeds. Beans and lentils. Bacon. Hot dogs. Sausage. Dairy High-fat cheeses. Whole milk, chocolate milk, and beverages made with milk, such as milk shakes. Half-and-half. Cream. sour cream. Ice cream. Beverages Caffeinated beverages (such as coffee, tea, soda, or energy drinks). Alcoholic beverages. Fruit juices with pulp.  Prune juice. Soft drinks sweetened with high-fructose corn syrup or sugar alcohols. High-calorie sports drinks. Fats and oils Butter. Cream sauces. Margarine. Salad oils. Plain salad dressings. Olives. Avocados. Mayonnaise. Sweets and desserts Sweet rolls, doughnuts, and sweet breads. Sugar-free desserts sweetened with sugar alcohols such as xylitol and sorbitol. Seasoning and other foods Honey. Hot sauce. Chili powder. Gravy. Cream-based or milk-based soups. Pancakes and waffles. Summary  When you have diarrhea, the foods you eat and your eating habits are very important.  Make sure you get at least 8-10 cups of fluid each day, or enough to keep your urine clear or pale yellow.  Eat bland foods and gradually reintroduce healthy, nutrient-rich foods as tolerated, or as told by your health care provider.  Avoid high-fiber, fried, greasy, or spicy foods. This information is not intended to replace advice given to you by your health care provider. Make sure you discuss any questions you have with your health care provider. Document Released: 02/08/2004 Document Revised: 11/15/2016 Document Reviewed: 11/15/2016 Elsevier Interactive Patient Education  2018 Elsevier Inc.  

## 2018-07-01 NOTE — MAU Note (Signed)
Something kept forcing her to get up at 0830 and again at 1030, when she went to the bathroom, she threw up.  Has been throwing up since. Period is not late

## 2018-07-01 NOTE — MAU Provider Note (Signed)
History     CSN: [id number]  Arrival date and time: 07/01/18 1351   First Provider Initiated Contact with Patient 07/01/18 1528      Chief Complaint  Patient presents with  . Emesis   HPI Ms. Audrey Schultz is a 27 y.o. G1P1001 who presents to MAU today with complaint of N/V since this morning. No active vomiting at this time. She denies diarrhea, fever or other GYN complaints. She has not tried anything for her symptoms.   OB History    Gravida  1   Para  1   Term  1   Preterm      AB      Living  1     SAB      TAB      Ectopic      Multiple      Live Births  1           Past Medical History:  Diagnosis Date  . Dysmenorrhea   . Medical history non-contributory     Past Surgical History:  Procedure Laterality Date  . NO PAST SURGERIES      Family History  Problem Relation Age of Onset  . Hypertension Mother   . Diabetes Mother     Social History   Tobacco Use  . Smoking status: Current Some Day Smoker    Packs/day: 0.00    Types: Cigarettes  . Smokeless tobacco: Never Used  Substance Use Topics  . Alcohol use: Yes    Comment: occasionally  . Drug use: Yes    Types: Marijuana    Comment: patient states that she quit this october 2014    Allergies:  Allergies  Allergen Reactions  . Shellfish Allergy Anaphylaxis    Medications Prior to Admission  Medication Sig Dispense Refill Last Dose  . ibuprofen (ADVIL,MOTRIN) 600 MG tablet Take 1 tablet (600 mg total) by mouth every 6 (six) hours as needed. 30 tablet 0 Past Month at Unknown time  . ondansetron (ZOFRAN ODT) 8 MG disintegrating tablet Take 1 tablet (8 mg total) by mouth every 8 (eight) hours as needed for nausea or vomiting. 20 tablet 0     Review of Systems  Constitutional: Negative for fever.  Gastrointestinal: Positive for nausea and vomiting. Negative for abdominal pain, constipation and diarrhea.  Genitourinary: Negative for dysuria, frequency, urgency, vaginal  bleeding and vaginal discharge.   Physical Exam   Blood pressure 118/83, pulse 91, temperature 98.7 F (37.1 C), temperature source Oral, resp. rate 16, weight 134 lb 4 oz (60.9 kg), last menstrual period 06/15/2018.  Physical Exam  Nursing note and vitals reviewed. Constitutional: She is oriented to person, place, and time. She appears well-developed and well-nourished. No distress.  HENT:  Head: Normocephalic and atraumatic.  Cardiovascular: Normal rate.  Respiratory: Effort normal.  GI: Soft. She exhibits no distension.  Neurological: She is alert and oriented to person, place, and time.  Skin: Skin is warm and dry. No erythema.  Psychiatric: She has a normal mood and affect.   Results for orders placed or performed during the hospital encounter of 07/01/18 (from the past 24 hour(s))  Urinalysis, Routine w reflex microscopic     Status: Abnormal   Collection Time: 07/01/18  2:17 PM  Result Value Ref Range   Color, Urine YELLOW YELLOW   APPearance HAZY (A) CLEAR   Specific Gravity, Urine 1.017 1.005 - 1.030   pH 8.0 5.0 - 8.0   Glucose, UA NEGATIVE NEGATIVE  mg/dL   Hgb urine dipstick NEGATIVE NEGATIVE   Bilirubin Urine NEGATIVE NEGATIVE   Ketones, ur NEGATIVE NEGATIVE mg/dL   Protein, ur NEGATIVE NEGATIVE mg/dL   Nitrite NEGATIVE NEGATIVE   Leukocytes, UA NEGATIVE NEGATIVE  Pregnancy, urine POC     Status: None   Collection Time: 07/01/18  2:22 PM  Result Value Ref Range   Preg Test, Ur NEGATIVE NEGATIVE    MAU Course  Procedures None  MDM UPT - negative UA today without evidence of dehyadration   Assessment and Plan  A: Viral gastroenteritis Negative pregnancy test   P:  Discharge home Rx for Zofran given to patient  BRAT diet discussed Patient advised to follow-up with PCP if symptoms persist  Patient may return to MAU as needed or if her condition were to change or worsen, however since not pregnant or of GYN origin patient advised to seek additional  evaluation at [facility], [facility] or [facility] if symptoms persist or worsen  Vonzella Nipple, PA-C 07/01/2018, 3:28 PM

## 2019-09-16 ENCOUNTER — Other Ambulatory Visit: Payer: Self-pay

## 2019-09-16 ENCOUNTER — Encounter (HOSPITAL_COMMUNITY): Payer: Self-pay | Admitting: Emergency Medicine

## 2019-09-16 ENCOUNTER — Emergency Department (HOSPITAL_COMMUNITY)
Admission: EM | Admit: 2019-09-16 | Discharge: 2019-09-16 | Disposition: A | Discharge status: Home / Self Care | Payer: Medicaid Other | Attending: Emergency Medicine | Admitting: Emergency Medicine

## 2019-09-16 DIAGNOSIS — Z720 Tobacco use: Secondary | ICD-10-CM | POA: Diagnosis not present

## 2019-09-16 DIAGNOSIS — R519 Headache, unspecified: Secondary | ICD-10-CM | POA: Diagnosis present

## 2019-09-16 LAB — I-STAT CHEM 8, ED
BUN: 6 mg/dL (ref 6–20)
Calcium, Ion: 1.18 mmol/L (ref 1.15–1.40)
Chloride: 105 mmol/L (ref 98–111)
Creatinine, Ser: 0.6 mg/dL (ref 0.44–1.00)
Glucose, Bld: 81 mg/dL (ref 70–99)
HCT: 36 % (ref 36.0–46.0)
Hemoglobin: 12.2 g/dL (ref 12.0–15.0)
Potassium: 3.7 mmol/L (ref 3.5–5.1)
Sodium: 140 mmol/L (ref 135–145)
TCO2: 23 mmol/L (ref 22–32)

## 2019-09-16 LAB — I-STAT BETA HCG BLOOD, ED (MC, WL, AP ONLY): I-stat hCG, quantitative: 5.5 m[IU]/mL — ABNORMAL HIGH (ref <5)

## 2019-09-16 MED ORDER — DIPHENHYDRAMINE HCL 50 MG/ML IJ SOLN
12.5000 mg | Freq: Once | INTRAMUSCULAR | Status: AC
  Administered 2019-09-16: 12.5 mg via INTRAVENOUS
  Filled 2019-09-16: qty 1

## 2019-09-16 MED ORDER — METOCLOPRAMIDE HCL 5 MG/ML IJ SOLN
10.0000 mg | Freq: Once | INTRAMUSCULAR | Status: AC
  Administered 2019-09-16: 10 mg via INTRAVENOUS
  Filled 2019-09-16: qty 2

## 2019-09-16 MED ORDER — SODIUM CHLORIDE 0.9 % IV BOLUS
1000.0000 mL | Freq: Once | INTRAVENOUS | Status: AC
  Administered 2019-09-16: 1000 mL via INTRAVENOUS

## 2019-09-16 MED ORDER — KETOROLAC TROMETHAMINE 15 MG/ML IJ SOLN
15.0000 mg | Freq: Once | INTRAMUSCULAR | Status: AC
  Administered 2019-09-16: 15 mg via INTRAVENOUS
  Filled 2019-09-16: qty 1

## 2019-09-16 MED ORDER — DEXAMETHASONE SODIUM PHOSPHATE 10 MG/ML IJ SOLN
8.0000 mg | Freq: Once | INTRAMUSCULAR | Status: AC
  Administered 2019-09-16: 8 mg via INTRAVENOUS
  Filled 2019-09-16: qty 1

## 2019-09-16 NOTE — ED Notes (Signed)
Patient verbalizes understanding of discharge instructions. Opportunity for questioning and answers were provided. Armband removed by staff, pt discharged from ED.  

## 2019-09-16 NOTE — ED Triage Notes (Signed)
Pt reports HA for the last week. No neuro deficits noted. Hx of same. VSS.

## 2019-09-16 NOTE — ED Notes (Signed)
ED Provider at bedside. 

## 2019-09-16 NOTE — ED Provider Notes (Signed)
[facility] EMERGENCY DEPARTMENT Provider Note   CSN: [id number] Arrival date & time: 09/16/19  0725     History   Chief Complaint Chief Complaint  Patient presents with  . Headache    HPI Audrey Schultz is a 28 y.o. female who presents with a headache.  Patient states that for the past week she has had an intermittent frontal headache.  It feels like a throbbing, It has been gradually worsening and become more persistent in nature. She had to go to work today but couldn't do her job so told her boss she had to come to the ED. She has had similar headaches before and usually she will "sleep it off" or take Tylenol which is only provided mild relief this time.  She has had similar headaches for the past 2 years.  Light and stress makes it worse. She will sometimes have issues with driving where she will veer out of her lane but denies any diplopia or blurry vision. No N/V. No fever, LOC, trauma, neck stiffness, acute onset, worst headache of life, different from previous HA.     HPI  Past Medical History:  Diagnosis Date  . Dysmenorrhea   . Medical history non-contributory     Patient Active Problem List   Diagnosis Date Noted  . Indication for care in labor or delivery 05/08/2014  . Pregnancy 05/08/2014  . Echogenic focus of heart of fetus affecting antepartum care of mother 01/05/2014  . Echogenic focus of heart, fetal, affecting care of mother, antepartum 01/04/2014  . Mental disorders of mother, antepartum 11/19/2013  . 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 History of depression 11/10/2013  . Marijuana abuse 10/24/2013  . Normal intrauterine pregnancy on prenatal ultrasound 10/21/2013    Past Surgical History:  Procedure Laterality Date  . NO PAST SURGERIES       OB History    Gravida  1   Para  1   Term  1   Preterm      AB      Living  1     SAB      TAB      Ectopic      Multiple      Live Births  1             Home Medications    Prior to Admission medications   Medication Sig Start Date End Date Taking? Authorizing Provider  ibuprofen (ADVIL,MOTRIN) 600 MG tablet Take 1 tablet (600 mg total) by mouth every 6 (six) hours as needed. 05/18/16   Luvenia Redden, PA-C  ondansetron (ZOFRAN ODT) 8 MG disintegrating tablet Take 1 tablet (8 mg total) by mouth every 8 (eight) hours as needed for nausea or vomiting. 07/01/18   Luvenia Redden, PA-C    Family History Family History  Problem Relation Age of Onset  . Hypertension Mother   . Diabetes Mother     Social History Social History   Tobacco Use  . Smoking status: Current Some Day Smoker    Packs/day: 0.00    Types: Cigarettes  . Smokeless tobacco: Never Used  Substance Use Topics  . Alcohol use: Yes    Comment: occasionally  . Drug use: Yes    Types: Marijuana    Comment: patient states that she quit this october 2014     Allergies   Shellfish allergy   Review of Systems Review of Systems  Constitutional: Negative for fever.  Eyes: Negative for  visual disturbance.  Gastrointestinal: Negative for vomiting.  Neurological: Positive for headaches. Negative for dizziness, syncope, weakness and numbness.  All other systems reviewed and are negative.    Physical Exam Updated Vital Signs BP 129/82 (BP Location: Right Arm)   Pulse 87   Temp 99.3 F (37.4 C) (Oral)   Resp 16   LMP 09/01/2019   SpO2 100%   Physical Exam Vitals signs and nursing note reviewed.  Constitutional:      General: She is not in acute distress.    Appearance: She is well-developed. She is not ill-appearing.  HENT:     Head: Normocephalic and atraumatic.  Eyes:     General: No scleral icterus.       Right eye: No discharge.        Left eye: No discharge.     Conjunctiva/sclera: Conjunctivae normal.     Pupils: Pupils are equal, round, and reactive to light.     Comments: No photophobia  Neck:     Musculoskeletal: Normal range of  motion.  Cardiovascular:     Rate and Rhythm: Normal rate.  Pulmonary:     Effort: Pulmonary effort is normal. No respiratory distress.  Abdominal:     General: There is no distension.  Skin:    General: Skin is warm and dry.  Neurological:     Mental Status: She is alert and oriented to person, place, and time.     Comments: Mental Status:  Alert, oriented, thought content appropriate, able to give a coherent history. Speech fluent without evidence of aphasia. Able to follow 2 step commands without difficulty.  Cranial Nerves:  II:  Peripheral visual fields grossly normal, pupils equal, round, reactive to light III,IV, VI: ptosis not present, extra-ocular motions intact bilaterally  V,VII: smile symmetric, facial light touch sensation equal VIII: hearing grossly normal to voice  X: uvula elevates symmetrically  XI: bilateral shoulder shrug symmetric and strong XII: midline tongue extension without fassiculations Motor:  Normal tone. 5/5 in upper and lower extremities bilaterally including strong and equal grip strength and dorsiflexion/plantar flexion Sensory: Pinprick and light touch normal in all extremities.  Cerebellar: normal finger-to-nose with bilateral upper extremities Gait: normal gait and balance CV: distal pulses palpable throughout    Psychiatric:        Behavior: Behavior normal.      ED Treatments / Results  Labs (all labs ordered are listed, but only abnormal results are displayed) Labs Reviewed  I-STAT BETA HCG BLOOD, ED ([facility], [facility], [facility] ONLY) - Abnormal; Notable for the following components:      Result Value   I-stat hCG, quantitative 5.5 (*)    All other components within normal limits  I-STAT CHEM 8, ED    EKG None  Radiology No results found.  Procedures Procedures (including critical care time)  Medications Ordered in ED Medications  sodium chloride 0.9 % bolus 1,000 mL (0 mLs Intravenous Stopped 09/16/19 1411)  ketorolac (TORADOL) 15 MG/ML  injection 15 mg (15 mg Intravenous Given 09/16/19 1213)  metoCLOPramide (REGLAN) injection 10 mg (10 mg Intravenous Given 09/16/19 1210)  diphenhydrAMINE (BENADRYL) injection 12.5 mg (12.5 mg Intravenous Given 09/16/19 1208)  dexamethasone (DECADRON) injection 8 mg (8 mg Intravenous Given 09/16/19 1211)     Initial Impression / Assessment and Plan / ED Course  I have reviewed the triage vital signs and the nursing notes.  Pertinent labs & imaging results that were available during my care of the patient were reviewed by me  and considered in my medical decision making (see chart for details).  28 year old female presents with a headache that is been coming and going for the past week.  She has had similar headaches in the past.  Her vitals are normal.  Her neurologic exam is normal.  Labs are normal.  Pregnancy test is negative.  She was given a migraine cocktail and felt much better although is very sleepy.  She is advised to try NSAIDs for headaches in the future.  She was advised to return if worsening  Final Clinical Impressions(s) / ED Diagnoses   Final diagnoses:  Bad headache    ED Discharge Orders    None       Bethel BornGekas,  Marie, PA-C 09/16/19 1712    Virgina Norfolkuratolo, Adam, DO 09/17/19 [phone]

## 2019-09-16 NOTE — Discharge Instructions (Signed)
Take Ibuprofen as needed for headaches Return if worsening

## 2019-10-05 ENCOUNTER — Telehealth: Payer: Medicaid Other | Admitting: Nurse Practitioner

## 2019-10-05 DIAGNOSIS — N920 Excessive and frequent menstruation with regular cycle: Secondary | ICD-10-CM

## 2019-10-05 NOTE — Progress Notes (Signed)
Had to contact patient by phone to clarify symptoms.she is actually c/o spotting and actually just wanted to come in and be tested for STD because she has a new sexual partner. patinet was told that sh ewill ned to call and make an appointment on her own.

## 2019-12-23 ENCOUNTER — Emergency Department (HOSPITAL_COMMUNITY)
Admission: EM | Admit: 2019-12-23 | Discharge: 2019-12-23 | Disposition: A | Discharge status: Home / Self Care | Payer: Medicaid Other | Attending: Emergency Medicine | Admitting: Emergency Medicine

## 2019-12-23 ENCOUNTER — Other Ambulatory Visit: Payer: Self-pay

## 2019-12-23 ENCOUNTER — Encounter (HOSPITAL_COMMUNITY): Payer: Self-pay | Admitting: Emergency Medicine

## 2019-12-23 DIAGNOSIS — Z20822 Contact with and (suspected) exposure to covid-19: Secondary | ICD-10-CM

## 2019-12-23 DIAGNOSIS — R438 Other disturbances of smell and taste: Secondary | ICD-10-CM | POA: Diagnosis present

## 2019-12-23 DIAGNOSIS — R0982 Postnasal drip: Secondary | ICD-10-CM | POA: Diagnosis not present

## 2019-12-23 DIAGNOSIS — Z720 Tobacco use: Secondary | ICD-10-CM | POA: Insufficient documentation

## 2019-12-23 DIAGNOSIS — B9789 Other viral agents as the cause of diseases classified elsewhere: Secondary | ICD-10-CM

## 2019-12-23 DIAGNOSIS — U071 COVID-19: Secondary | ICD-10-CM | POA: Insufficient documentation

## 2019-12-23 DIAGNOSIS — J019 Acute sinusitis, unspecified: Secondary | ICD-10-CM | POA: Insufficient documentation

## 2019-12-23 DIAGNOSIS — J329 Chronic sinusitis, unspecified: Secondary | ICD-10-CM

## 2019-12-23 NOTE — ED Provider Notes (Signed)
[facility] EMERGENCY DEPARTMENT Provider Note   CSN: [id number] Arrival date & time: 12/23/19  1939     History Chief Complaint  Patient presents with  . Nasal Congestion / Loss of smell/taste    Audrey Schultz is a 29 y.o. female.  HPI  Patient is a 29 year old female presented today with 5 days of congestion, loss of taste and smell and difficulty breathing through her nose.  She denies any cough, chest pain, chest tightness, shortness of breath when breathing through her mouth, fevers or chills.  She denies any known Covid exposure.  She states that she is still able to taste somewhat but seems decreased.  Patient denies any history of blood clots.  Has no significant past medical history.  Denies any significant weakness is still able to do her ADLs.  No palpitations.    Past Medical History:  Diagnosis Date  . Dysmenorrhea   . Medical history non-contributory     Patient Active Problem List   Diagnosis Date Noted  . Indication for care in labor or delivery 05/08/2014  . Pregnancy 05/08/2014  . Echogenic focus of heart of fetus affecting antepartum care of mother 01/05/2014  . Echogenic focus of heart, fetal, affecting care of mother, antepartum 01/04/2014  . Mental disorders of mother, antepartum 11/19/2013  . 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 History of depression 11/10/2013  . Marijuana abuse 10/24/2013  . Normal intrauterine pregnancy on prenatal ultrasound 10/21/2013    Past Surgical History:  Procedure Laterality Date  . NO PAST SURGERIES       OB History    Gravida  1   Para  1   Term  1   Preterm      AB      Living  1     SAB      TAB      Ectopic      Multiple      Live Births  1           Family History  Problem Relation Age of Onset  . Hypertension Mother   . Diabetes Mother     Social History   Tobacco Use  . Smoking status: Current Some Day Smoker    Packs/day: 0.00    Types:  Cigarettes  . Smokeless tobacco: Never Used  Substance Use Topics  . Alcohol use: Yes    Comment: occasionally  . Drug use: Yes    Types: Marijuana    Comment: patient states that she quit this october 2014    Home Medications Prior to Admission medications   Medication Sig Start Date End Date Taking? Authorizing Provider  ibuprofen (ADVIL,MOTRIN) 600 MG tablet Take 1 tablet (600 mg total) by mouth every 6 (six) hours as needed. 05/18/16   Marny Lowenstein, PA-C  ondansetron (ZOFRAN ODT) 8 MG disintegrating tablet Take 1 tablet (8 mg total) by mouth every 8 (eight) hours as needed for nausea or vomiting. 07/01/18   Marny Lowenstein, PA-C    Allergies    Shellfish allergy  Review of Systems   Review of Systems  Constitutional: Negative for chills and fever.  HENT: Positive for congestion, postnasal drip and rhinorrhea. Negative for sore throat.   Eyes: Negative for pain.  Respiratory: Positive for shortness of breath (only through nose). Negative for cough.   Cardiovascular: Negative for chest pain and leg swelling.  Gastrointestinal: Negative for abdominal pain and vomiting.  Genitourinary: Negative for dysuria.  Musculoskeletal:  Negative for myalgias.  Skin: Negative for rash.  Neurological: Negative for dizziness and headaches.    Physical Exam Updated Vital Signs BP 119/82 (BP Location: Right Arm)   Pulse 89   Temp 98.6 F (37 C) (Oral)   Resp 16   LMP 12/14/2019   SpO2 100%   Physical Exam Vitals and nursing note reviewed.  Constitutional:      General: She is not in acute distress. HENT:     Head: Normocephalic and atraumatic.     Nose: Nose normal.  Eyes:     General: No scleral icterus. Cardiovascular:     Rate and Rhythm: Normal rate and regular rhythm.     Pulses: Normal pulses.     Heart sounds: Normal heart sounds.  Pulmonary:     Effort: Pulmonary effort is normal. No respiratory distress.     Breath sounds: No wheezing.  Abdominal:     Palpations:  Abdomen is soft.     Tenderness: There is no abdominal tenderness.  Musculoskeletal:     Cervical back: Normal range of motion.     Right lower leg: No edema.     Left lower leg: No edema.  Skin:    General: Skin is warm and dry.     Capillary Refill: Capillary refill takes less than 2 seconds.  Neurological:     Mental Status: She is alert. Mental status is at baseline.  Psychiatric:        Mood and Affect: Mood normal.        Behavior: Behavior normal.     ED Results / Procedures / Treatments   Labs (all labs ordered are listed, but only abnormal results are displayed) Labs Reviewed  SARS CORONAVIRUS 2 (TAT 6-24 HRS)    EKG None  Radiology No results found.  Procedures Procedures (including critical care time)  Medications Ordered in ED Medications - No data to display  ED Course  I have reviewed the triage vital signs and the nursing notes.  Pertinent labs & imaging results that were available during my care of the patient were reviewed by me and considered in my medical decision making (see chart for details).    MDM Rules/Calculators/A&P                      Well-appearing patient with 5 days of congestion.  She has no maxillary or frontal sinus tenderness to palpation.  She is afebrile doubt bacterial sinusitis.  Discussed conservative management with Tylenol/ibuprofen Zyrtec.  She has tried no interventions at this time.  Discussed possibility of Covid infection.  This is possible though will also smell and taste may be secondary to severe congestion.  She has no sore throat to suggest strep pharyngitis.  No other concerning symptoms.  She does not have any chest pain shortness of breath.  Vitals are fully within normal limits.  Audrey Schultz was evaluated in Emergency Department on 12/23/2019 for the symptoms described in the history of present illness. She was evaluated in the context of the global COVID-19 pandemic, which necessitated consideration that  the patient might be at risk for infection with the SARS-CoV-2 virus that causes COVID-19. Institutional protocols and algorithms that pertain to the evaluation of patients at risk for COVID-19 are in a state of rapid change based on information released by regulatory bodies including the CDC and federal and state organizations. These policies and algorithms were followed during the patient's care in the ED. The medical  records were personally reviewed by myself. I personally reviewed all lab results and interpreted all imaging studies and either concurred with their official read or contacted radiology for clarification.   This patient appears reasonably screened and I doubt any other medical condition requiring further workup, evaluation, or treatment in the ED at this time prior to discharge.   Patient's vitals are WNL apart from vital sign abnormalities discussed above, patient is in NAD, and able to ambulate in the ED at their baseline and able to tolerate PO.  Pain has been managed or a plan has been made for home management and has no complaints prior to discharge. Patient is comfortable with above plan and for discharge at this time. All questions were answered prior to disposition. Results from the ER workup discussed with the patient face to face and all questions answered to the best of my ability. The patient is safe for discharge with strict return precautions. Patient appears safe for discharge with appropriate follow-up. Conveyed my impression with the patient and they voiced understanding and are agreeable to plan.   An After Visit Summary was printed and given to the patient.  Portions of this note were generated with [employer]. Dictation errors may occur despite best attempts at proofreading.    Final Clinical Impression(s) / ED Diagnoses Final diagnoses:  Suspected COVID-19 virus infection  Viral sinusitis    Rx / DC Orders ED Discharge Orders    None         Tedd Sias, [place] 12/23/19 2137    Drenda Freeze, MD 12/23/19 2230

## 2019-12-23 NOTE — Discharge Instructions (Signed)
Please use Tylenol or ibuprofen for pain.  You may use 600 mg ibuprofen every 6 hours or 1000 mg of Tylenol every 6 hours.  You may choose to alternate between the 2.  This would be most effective.  Not to exceed 4 g of Tylenol within 24 hours.  Not to exceed 3200 mg ibuprofen 24 hours.  Viral Illness TREATMENT  Treatment is directed at relieving symptoms. There is no cure. Antibiotics are not effective, because the infection is caused by a virus, not by bacteria. Treatment may include:  Increased fluid intake. Sports drinks offer valuable electrolytes, sugars, and fluids.  Breathing heated mist or steam (vaporizer or shower).  Eating chicken soup or other clear broths, and maintaining good nutrition.  Getting plenty of rest.  Using gargles or lozenges for comfort.  Increasing usage of your inhaler if you have asthma.  Return to work when your temperature has returned to normal.  Gargle warm salt water and spit it out for sore throat. Take benadryl to decrease sinus secretions. Continue to alternate between Tylenol and ibuprofen for pain and fever control.  Follow Up: Follow up with your primary care doctor in 5-7 days for recheck of ongoing symptoms.  Return to emergency department for emergent changing or worsening of symptoms.        Person Under Monitoring Name: Audrey Schultz  Location: [place] [address]   Infection Prevention Recommendations for Individuals Confirmed to have, or Being Evaluated for, 2019 Novel Coronavirus (COVID-19) Infection Who Receive Care at Home  Individuals who are confirmed to have, or are being evaluated for, COVID-19 should follow the prevention steps below until a healthcare provider or local or state health department says they can return to normal activities.  Stay home except to get medical care You should restrict activities outside your home, except for getting medical care. Do not go to work, school, or public areas, and  do not use public transportation or taxis.  Call ahead before visiting your doctor Before your medical appointment, call the healthcare provider and tell them that you have, or are being evaluated for, COVID-19 infection. This will help the healthcare provider's office take steps to keep other people from getting infected. Ask your healthcare provider to call the local or state health department.  Monitor your symptoms Seek prompt medical attention if your illness is worsening (e.g., difficulty breathing). Before going to your medical appointment, call the healthcare provider and tell them that you have, or are being evaluated for, COVID-19 infection. Ask your healthcare provider to call the local or state health department.  Wear a facemask You should wear a facemask that covers your nose and mouth when you are in the same room with other people and when you visit a healthcare provider. People who live with or visit you should also wear a facemask while they are in the same room with you.  Separate yourself from other people in your home As much as possible, you should stay in a different room from other people in your home. Also, you should use a separate bathroom, if available.  Avoid sharing household items You should not share dishes, drinking glasses, cups, eating utensils, towels, bedding, or other items with other people in your home. After using these items, you should wash them thoroughly with soap and water.  Cover your coughs and sneezes Cover your mouth and nose with a tissue when you cough or sneeze, or you can cough or sneeze into your  sleeve. Throw used tissues in a lined trash can, and immediately wash your hands with soap and water for at least 20 seconds or use an alcohol-based hand rub.  Wash your [employer] your hands often and thoroughly with soap and water for at least 20 seconds. You can use an alcohol-based hand sanitizer if soap and water are not available  and if your hands are not visibly dirty. Avoid touching your eyes, nose, and mouth with unwashed hands.   Prevention Steps for Caregivers and Household Members of Individuals Confirmed to have, or Being Evaluated for, COVID-19 Infection Being Cared for in the Home  If you live with, or provide care at home for, a person confirmed to have, or being evaluated for, COVID-19 infection please follow these guidelines to prevent infection:  Follow healthcare provider's instructions Make sure that you understand and can help the patient follow any healthcare provider instructions for all care.  Provide for the patient's basic needs You should help the patient with basic needs in the home and provide support for getting groceries, prescriptions, and other personal needs.  Monitor the patient's symptoms If they are getting sicker, call his or her medical provider and tell them that the patient has, or is being evaluated for, COVID-19 infection. This will help the healthcare provider's office take steps to keep other people from getting infected. Ask the healthcare provider to call the local or state health department.  Limit the number of people who have contact with the patient If possible, have only one caregiver for the patient. Other household members should stay in another home or place of residence. If this is not possible, they should stay in another room, or be separated from the patient as much as possible. Use a separate bathroom, if available. Restrict visitors who do not have an essential need to be in the home.  Keep older adults, very young children, and other sick people away from the patient Keep older adults, very young children, and those who have compromised immune systems or chronic health conditions away from the patient. This includes people with chronic heart, lung, or kidney conditions, diabetes, and cancer.  Ensure good ventilation Make sure that shared spaces in the  home have good air flow, such as from an air conditioner or an opened window, weather permitting.  Wash your hands often Wash your hands often and thoroughly with soap and water for at least 20 seconds. You can use an alcohol based hand sanitizer if soap and water are not available and if your hands are not visibly dirty. Avoid touching your eyes, nose, and mouth with unwashed hands. Use disposable paper towels to dry your hands. If not available, use dedicated cloth towels and replace them when they become wet.  Wear a facemask and gloves Wear a disposable facemask at all times in the room and gloves when you touch or have contact with the patient's blood, body fluids, and/or secretions or excretions, such as sweat, saliva, sputum, nasal mucus, vomit, urine, or feces.  Ensure the mask fits over your nose and mouth tightly, and do not touch it during use. Throw out disposable facemasks and gloves after using them. Do not reuse. Wash your hands immediately after removing your facemask and gloves. If your personal clothing becomes contaminated, carefully remove clothing and launder. Wash your hands after handling contaminated clothing. Place all used disposable facemasks, gloves, and other waste in a lined container before disposing them with other household waste. Remove gloves and wash  your hands immediately after handling these items.  Do not share dishes, glasses, or other household items with the patient Avoid sharing household items. You should not share dishes, drinking glasses, cups, eating utensils, towels, bedding, or other items with a patient who is confirmed to have, or being evaluated for, COVID-19 infection. After the person uses these items, you should wash them thoroughly with soap and water.  Wash laundry thoroughly Immediately remove and wash clothes or bedding that have blood, body fluids, and/or secretions or excretions, such as sweat, saliva, sputum, nasal mucus, vomit,  urine, or feces, on them. Wear gloves when handling laundry from the patient. Read and follow directions on labels of laundry or clothing items and detergent. In general, wash and dry with the warmest temperatures recommended on the label.  Clean all areas the individual has used often Clean all touchable surfaces, such as counters, tabletops, doorknobs, bathroom fixtures, toilets, phones, keyboards, tablets, and bedside tables, every day. Also, clean any surfaces that may have blood, body fluids, and/or secretions or excretions on them. Wear gloves when cleaning surfaces the patient has come in contact with. Use a diluted bleach solution (e.g., dilute bleach with 1 part bleach and 10 parts water) or a household disinfectant with a label that says EPA-registered for coronaviruses. To make a bleach solution at home, add 1 tablespoon of bleach to 1 quart (4 cups) of water. For a larger supply, add  cup of bleach to 1 gallon (16 cups) of water. Read labels of cleaning products and follow recommendations provided on product labels. Labels contain instructions for safe and effective use of the cleaning product including precautions you should take when applying the product, such as wearing gloves or eye protection and making sure you have good ventilation during use of the product. Remove gloves and wash hands immediately after cleaning.  Monitor yourself for signs and symptoms of illness Caregivers and household members are considered close contacts, should monitor their health, and will be asked to limit movement outside of the home to the extent possible. Follow the monitoring steps for close contacts listed on the symptom monitoring form.   ? If you have additional questions, contact your local health department or call the epidemiologist on call at [phone] (available 24/7). ? This guidance is subject to change. For the most up-to-date guidance from [facility], please refer to their  website: [employer]

## 2019-12-23 NOTE — ED Triage Notes (Signed)
Patient reports nasal congestion , loss of smell and taste onset this week , denies fever or chills .

## 2019-12-24 LAB — SARS CORONAVIRUS 2 (TAT 6-24 HRS): SARS Coronavirus 2: POSITIVE — AB

## 2020-01-07 ENCOUNTER — Inpatient Hospital Stay (HOSPITAL_COMMUNITY)
Admission: EM | Admit: 2020-01-07 | Discharge: 2020-01-10 | DRG: 481 | Disposition: A | Discharge status: Home / Self Care | Payer: Medicaid Other | Attending: Orthopedic Surgery | Admitting: Orthopedic Surgery

## 2020-01-07 ENCOUNTER — Inpatient Hospital Stay (HOSPITAL_COMMUNITY): Payer: Medicaid Other | Admitting: Certified Registered Nurse Anesthetist

## 2020-01-07 ENCOUNTER — Encounter (HOSPITAL_COMMUNITY): Payer: Self-pay | Admitting: Orthopedic Surgery

## 2020-01-07 ENCOUNTER — Inpatient Hospital Stay (HOSPITAL_COMMUNITY): Payer: Medicaid Other

## 2020-01-07 ENCOUNTER — Emergency Department (HOSPITAL_COMMUNITY): Payer: Medicaid Other

## 2020-01-07 ENCOUNTER — Other Ambulatory Visit: Payer: Self-pay

## 2020-01-07 ENCOUNTER — Encounter (HOSPITAL_COMMUNITY)
Admission: EM | Disposition: A | Discharge status: Home / Self Care | Payer: Self-pay | Source: Home / Self Care | Attending: Orthopedic Surgery

## 2020-01-07 DIAGNOSIS — S2232XA Fracture of one rib, left side, initial encounter for closed fracture: Secondary | ICD-10-CM | POA: Diagnosis present

## 2020-01-07 DIAGNOSIS — Z8616 Personal history of COVID-19: Secondary | ICD-10-CM

## 2020-01-07 DIAGNOSIS — S7291XA Unspecified fracture of right femur, initial encounter for closed fracture: Secondary | ICD-10-CM

## 2020-01-07 DIAGNOSIS — F10129 Alcohol abuse with intoxication, unspecified: Secondary | ICD-10-CM | POA: Diagnosis present

## 2020-01-07 DIAGNOSIS — F17200 Nicotine dependence, unspecified, uncomplicated: Secondary | ICD-10-CM | POA: Diagnosis present

## 2020-01-07 DIAGNOSIS — Y92410 Unspecified street and highway as the place of occurrence of the external cause: Secondary | ICD-10-CM | POA: Diagnosis not present

## 2020-01-07 DIAGNOSIS — F1290 Cannabis use, unspecified, uncomplicated: Secondary | ICD-10-CM | POA: Diagnosis present

## 2020-01-07 DIAGNOSIS — S2243XA Multiple fractures of ribs, bilateral, initial encounter for closed fracture: Secondary | ICD-10-CM | POA: Diagnosis not present

## 2020-01-07 DIAGNOSIS — Z419 Encounter for procedure for purposes other than remedying health state, unspecified: Secondary | ICD-10-CM

## 2020-01-07 DIAGNOSIS — S72351A Displaced comminuted fracture of shaft of right femur, initial encounter for closed fracture: Secondary | ICD-10-CM | POA: Diagnosis present

## 2020-01-07 DIAGNOSIS — S2231XA Fracture of one rib, right side, initial encounter for closed fracture: Secondary | ICD-10-CM | POA: Diagnosis present

## 2020-01-07 DIAGNOSIS — T1490XA Injury, unspecified, initial encounter: Secondary | ICD-10-CM

## 2020-01-07 DIAGNOSIS — F10929 Alcohol use, unspecified with intoxication, unspecified: Secondary | ICD-10-CM | POA: Diagnosis present

## 2020-01-07 DIAGNOSIS — D62 Acute posthemorrhagic anemia: Secondary | ICD-10-CM | POA: Diagnosis not present

## 2020-01-07 DIAGNOSIS — R079 Chest pain, unspecified: Secondary | ICD-10-CM | POA: Diagnosis not present

## 2020-01-07 DIAGNOSIS — Y906 Blood alcohol level of 120-199 mg/100 ml: Secondary | ICD-10-CM | POA: Diagnosis present

## 2020-01-07 DIAGNOSIS — S2249XA Multiple fractures of ribs, unspecified side, initial encounter for closed fracture: Secondary | ICD-10-CM | POA: Diagnosis present

## 2020-01-07 DIAGNOSIS — T148XXA Other injury of unspecified body region, initial encounter: Secondary | ICD-10-CM

## 2020-01-07 DIAGNOSIS — E559 Vitamin D deficiency, unspecified: Secondary | ICD-10-CM | POA: Diagnosis present

## 2020-01-07 DIAGNOSIS — S2239XA Fracture of one rib, unspecified side, initial encounter for closed fracture: Secondary | ICD-10-CM | POA: Diagnosis present

## 2020-01-07 HISTORY — PX: INTRAMEDULLARY (IM) NAIL INTERTROCHANTERIC: SHX5875

## 2020-01-07 LAB — COMPREHENSIVE METABOLIC PANEL
ALT: 27 U/L (ref 0–44)
AST: 53 U/L — ABNORMAL HIGH (ref 15–41)
Albumin: 4.3 g/dL (ref 3.5–5.0)
Alkaline Phosphatase: 53 U/L (ref 38–126)
Anion gap: 13 (ref 5–15)
BUN: 9 mg/dL (ref 6–20)
CO2: 23 mmol/L (ref 22–32)
Calcium: 9.1 mg/dL (ref 8.9–10.3)
Chloride: 110 mmol/L (ref 98–111)
Creatinine, Ser: 0.67 mg/dL (ref 0.44–1.00)
GFR calc Af Amer: >60 mL/min (ref >60)
GFR calc non Af Amer: >60 mL/min (ref >60)
Glucose, Bld: 111 mg/dL — ABNORMAL HIGH (ref 70–99)
Potassium: 3.5 mmol/L (ref 3.5–5.1)
Sodium: 146 mmol/L — ABNORMAL HIGH (ref 135–145)
Total Bilirubin: 0.6 mg/dL (ref 0.3–1.2)
Total Protein: 7.3 g/dL (ref 6.5–8.1)

## 2020-01-07 LAB — PHOSPHORUS: Phosphorus: 2.8 mg/dL (ref 2.5–4.6)

## 2020-01-07 LAB — URINALYSIS, ROUTINE W REFLEX MICROSCOPIC
Bacteria, UA: NONE SEEN
Bilirubin Urine: NEGATIVE
Glucose, UA: NEGATIVE mg/dL
Hgb urine dipstick: NEGATIVE
Ketones, ur: NEGATIVE mg/dL
Leukocytes,Ua: NEGATIVE
Nitrite: NEGATIVE
Protein, ur: NEGATIVE mg/dL
Specific Gravity, Urine: 1.031 — ABNORMAL HIGH (ref 1.005–1.030)
pH: 6 (ref 5.0–8.0)

## 2020-01-07 LAB — CBC
HCT: 38.6 % (ref 36.0–46.0)
HCT: 42.7 % (ref 36.0–46.0)
Hemoglobin: 12.5 g/dL (ref 12.0–15.0)
Hemoglobin: 13.7 g/dL (ref 12.0–15.0)
MCH: 30.3 pg (ref 26.0–34.0)
MCH: 30.6 pg (ref 26.0–34.0)
MCHC: 32.1 g/dL (ref 30.0–36.0)
MCHC: 32.4 g/dL (ref 30.0–36.0)
MCV: 93.7 fL (ref 80.0–100.0)
MCV: 95.3 fL (ref 80.0–100.0)
Platelets: 236 10*3/uL (ref 150–400)
Platelets: 289 10*3/uL (ref 150–400)
RBC: 4.12 MIL/uL (ref 3.87–5.11)
RBC: 4.48 MIL/uL (ref 3.87–5.11)
RDW: 13.3 % (ref 11.5–15.5)
RDW: 13.4 % (ref 11.5–15.5)
WBC: 12.7 10*3/uL — ABNORMAL HIGH (ref 4.0–10.5)
WBC: 12.8 10*3/uL — ABNORMAL HIGH (ref 4.0–10.5)
nRBC: 0 % (ref 0.0–0.2)
nRBC: 0 % (ref 0.0–0.2)

## 2020-01-07 LAB — I-STAT CHEM 8, ED
BUN: 7 mg/dL (ref 6–20)
Calcium, Ion: 1.12 mmol/L — ABNORMAL LOW (ref 1.15–1.40)
Chloride: 110 mmol/L (ref 98–111)
Creatinine, Ser: 0.8 mg/dL (ref 0.44–1.00)
Glucose, Bld: 104 mg/dL — ABNORMAL HIGH (ref 70–99)
HCT: 44 % (ref 36.0–46.0)
Hemoglobin: 15 g/dL (ref 12.0–15.0)
Potassium: 3.4 mmol/L — ABNORMAL LOW (ref 3.5–5.1)
Sodium: 144 mmol/L (ref 135–145)
TCO2: 22 mmol/L (ref 22–32)

## 2020-01-07 LAB — SAMPLE TO BLOOD BANK

## 2020-01-07 LAB — IRON AND TIBC
Iron: 35 ug/dL (ref 28–170)
Saturation Ratios: 12 % (ref 10.4–31.8)
TIBC: 291 ug/dL (ref 250–450)
UIBC: 256 ug/dL

## 2020-01-07 LAB — ABO/RH: ABO/RH(D): A POS

## 2020-01-07 LAB — TYPE AND SCREEN
ABO/RH(D): A POS
Antibody Screen: NEGATIVE

## 2020-01-07 LAB — CREATININE, SERUM
Creatinine, Ser: 0.66 mg/dL (ref 0.44–1.00)
GFR calc Af Amer: >60 mL/min (ref >60)
GFR calc non Af Amer: >60 mL/min (ref >60)

## 2020-01-07 LAB — CDS SEROLOGY

## 2020-01-07 LAB — RAPID URINE DRUG SCREEN, HOSP PERFORMED
Amphetamines: NOT DETECTED
Barbiturates: NOT DETECTED
Benzodiazepines: NOT DETECTED
Cocaine: NOT DETECTED
Opiates: POSITIVE — AB
Tetrahydrocannabinol: POSITIVE — AB

## 2020-01-07 LAB — RETICULOCYTES
Immature Retic Fract: 6.2 % (ref 2.3–15.9)
RBC.: 4.12 MIL/uL (ref 3.87–5.11)
Retic Count, Absolute: 47.8 10*3/uL (ref 19.0–186.0)
Retic Ct Pct: 1.2 % (ref 0.4–3.1)

## 2020-01-07 LAB — I-STAT BETA HCG BLOOD, ED (MC, WL, AP ONLY): I-stat hCG, quantitative: <5 m[IU]/mL (ref <5)

## 2020-01-07 LAB — RESPIRATORY PANEL BY RT PCR (FLU A&B, COVID)
Influenza A by PCR: NEGATIVE
Influenza B by PCR: NEGATIVE
SARS Coronavirus 2 by RT PCR: POSITIVE — AB

## 2020-01-07 LAB — FOLATE: Folate: 8.7 ng/mL (ref >5.9)

## 2020-01-07 LAB — SURGICAL PCR SCREEN
MRSA, PCR: NEGATIVE
Staphylococcus aureus: POSITIVE — AB

## 2020-01-07 LAB — VITAMIN B12: Vitamin B-12: 156 pg/mL — ABNORMAL LOW (ref 180–914)

## 2020-01-07 LAB — MAGNESIUM: Magnesium: 1.7 mg/dL (ref 1.7–2.4)

## 2020-01-07 LAB — PROTIME-INR
INR: 1.1 (ref 0.8–1.2)
Prothrombin Time: 14 seconds (ref 11.4–15.2)

## 2020-01-07 LAB — ETHANOL: Alcohol, Ethyl (B): 199 mg/dL — ABNORMAL HIGH (ref <10)

## 2020-01-07 LAB — FERRITIN: Ferritin: 128 ng/mL (ref 11–307)

## 2020-01-07 LAB — VITAMIN D 25 HYDROXY (VIT D DEFICIENCY, FRACTURES): Vit D, 25-Hydroxy: 7.72 ng/mL — ABNORMAL LOW (ref 30–100)

## 2020-01-07 LAB — HIV ANTIBODY (ROUTINE TESTING W REFLEX): HIV Screen 4th Generation wRfx: NONREACTIVE

## 2020-01-07 LAB — LACTIC ACID, PLASMA: Lactic Acid, Venous: 3.2 mmol/L (ref 0.5–1.9)

## 2020-01-07 SURGERY — FIXATION, FRACTURE, INTERTROCHANTERIC, WITH INTRAMEDULLARY ROD
Anesthesia: General | Laterality: Right

## 2020-01-07 MED ORDER — ONDANSETRON HCL 4 MG/2ML IJ SOLN
INTRAMUSCULAR | Status: DC | PRN
  Administered 2020-01-07: 4 mg via INTRAVENOUS

## 2020-01-07 MED ORDER — METHOCARBAMOL 1000 MG/10ML IJ SOLN
500.0000 mg | Freq: Three times a day (TID) | INTRAVENOUS | Status: DC
  Filled 2020-01-07 (×13): qty 5

## 2020-01-07 MED ORDER — SENNOSIDES-DOCUSATE SODIUM 8.6-50 MG PO TABS: 1.0000 | ORAL_TABLET | Freq: Every evening | ORAL | Status: DC | PRN

## 2020-01-07 MED ORDER — KETOROLAC TROMETHAMINE 30 MG/ML IJ SOLN
30.0000 mg | Freq: Once | INTRAMUSCULAR | Status: AC | PRN
  Administered 2020-01-07: 30 mg via INTRAVENOUS

## 2020-01-07 MED ORDER — THIAMINE HCL 100 MG PO TABS
100.0000 mg | ORAL_TABLET | Freq: Every day | ORAL | Status: DC
  Administered 2020-01-07 – 2020-01-10 (×4): 100 mg via ORAL
  Filled 2020-01-07 (×4): qty 1

## 2020-01-07 MED ORDER — ACETAMINOPHEN 325 MG PO TABS: 325.0000 mg | ORAL_TABLET | Freq: Four times a day (QID) | ORAL | Status: DC | PRN

## 2020-01-07 MED ORDER — FENTANYL CITRATE (PF) 250 MCG/5ML IJ SOLN
INTRAMUSCULAR | Status: AC
  Filled 2020-01-07: qty 5

## 2020-01-07 MED ORDER — SUGAMMADEX SODIUM 200 MG/2ML IV SOLN
INTRAVENOUS | Status: DC | PRN
  Administered 2020-01-07: 200 mg via INTRAVENOUS

## 2020-01-07 MED ORDER — CEFAZOLIN SODIUM-DEXTROSE 1-4 GM/50ML-% IV SOLN
1.0000 g | Freq: Four times a day (QID) | INTRAVENOUS | Status: AC
  Administered 2020-01-07 – 2020-01-08 (×3): 1 g via INTRAVENOUS
  Filled 2020-01-07 (×4): qty 50

## 2020-01-07 MED ORDER — ONDANSETRON HCL 4 MG/2ML IJ SOLN: 4.0000 mg | Freq: Four times a day (QID) | INTRAMUSCULAR | Status: DC | PRN

## 2020-01-07 MED ORDER — METHOCARBAMOL 1000 MG/10ML IJ SOLN: 500.0000 mg | Freq: Four times a day (QID) | INTRAVENOUS | Status: DC | PRN

## 2020-01-07 MED ORDER — DIPHENHYDRAMINE HCL 12.5 MG/5ML PO ELIX: 12.5000 mg | ORAL_SOLUTION | ORAL | Status: DC | PRN

## 2020-01-07 MED ORDER — MIDAZOLAM HCL 2 MG/2ML IJ SOLN
INTRAMUSCULAR | Status: AC
  Filled 2020-01-07: qty 2

## 2020-01-07 MED ORDER — OXYCODONE HCL 5 MG PO TABS
10.0000 mg | ORAL_TABLET | ORAL | Status: DC | PRN
  Administered 2020-01-07: 15 mg via ORAL
  Filled 2020-01-07: qty 3

## 2020-01-07 MED ORDER — HYDROMORPHONE HCL 1 MG/ML IJ SOLN
0.5000 mg | INTRAMUSCULAR | Status: DC | PRN
  Administered 2020-01-07 (×2): 1 mg via INTRAVENOUS
  Filled 2020-01-07 (×2): qty 1

## 2020-01-07 MED ORDER — PROPOFOL 10 MG/ML IV BOLUS
INTRAVENOUS | Status: DC | PRN
  Administered 2020-01-07: 150 mg via INTRAVENOUS

## 2020-01-07 MED ORDER — ADULT MULTIVITAMIN W/MINERALS CH
1.0000 | ORAL_TABLET | Freq: Every day | ORAL | Status: DC
  Administered 2020-01-07 – 2020-01-10 (×4): 1 via ORAL
  Filled 2020-01-07 (×4): qty 1

## 2020-01-07 MED ORDER — DOCUSATE SODIUM 100 MG PO CAPS
100.0000 mg | ORAL_CAPSULE | Freq: Two times a day (BID) | ORAL | Status: DC
  Administered 2020-01-08 – 2020-01-09 (×4): 100 mg via ORAL
  Filled 2020-01-07 (×5): qty 1

## 2020-01-07 MED ORDER — VITAMIN D 25 MCG (1000 UNIT) PO TABS
2000.0000 [IU] | ORAL_TABLET | Freq: Two times a day (BID) | ORAL | Status: DC
  Administered 2020-01-07 – 2020-01-10 (×6): 2000 [IU] via ORAL
  Filled 2020-01-07 (×6): qty 2

## 2020-01-07 MED ORDER — IOHEXOL 300 MG/ML  SOLN
100.0000 mL | Freq: Once | INTRAMUSCULAR | Status: AC | PRN
  Administered 2020-01-07: 100 mL via INTRAVENOUS

## 2020-01-07 MED ORDER — LIDOCAINE 2% (20 MG/ML) 5 ML SYRINGE
INTRAMUSCULAR | Status: DC | PRN
  Administered 2020-01-07: 100 mg via INTRAVENOUS

## 2020-01-07 MED ORDER — 0.9 % SODIUM CHLORIDE (POUR BTL) OPTIME
TOPICAL | Status: DC | PRN
  Administered 2020-01-07: 1000 mL

## 2020-01-07 MED ORDER — ONDANSETRON HCL 4 MG/2ML IJ SOLN
INTRAMUSCULAR | Status: AC
  Filled 2020-01-07: qty 2

## 2020-01-07 MED ORDER — THIAMINE HCL 100 MG/ML IJ SOLN: 100.0000 mg | Freq: Every day | INTRAMUSCULAR | Status: DC

## 2020-01-07 MED ORDER — ZOLPIDEM TARTRATE 5 MG PO TABS
5.0000 mg | ORAL_TABLET | Freq: Every evening | ORAL | Status: DC | PRN
  Administered 2020-01-07 – 2020-01-08 (×2): 5 mg via ORAL
  Filled 2020-01-07 (×2): qty 1

## 2020-01-07 MED ORDER — LORAZEPAM 2 MG/ML IJ SOLN: 0.0000 mg | Freq: Two times a day (BID) | INTRAMUSCULAR | Status: DC

## 2020-01-07 MED ORDER — DOCUSATE SODIUM 100 MG PO CAPS: 100.0000 mg | ORAL_CAPSULE | Freq: Two times a day (BID) | ORAL | Status: DC

## 2020-01-07 MED ORDER — DEXAMETHASONE SODIUM PHOSPHATE 10 MG/ML IJ SOLN
INTRAMUSCULAR | Status: AC
  Filled 2020-01-07: qty 1

## 2020-01-07 MED ORDER — HYDROMORPHONE HCL 1 MG/ML IJ SOLN
INTRAMUSCULAR | Status: AC
  Filled 2020-01-07: qty 1

## 2020-01-07 MED ORDER — KETOROLAC TROMETHAMINE 30 MG/ML IJ SOLN
INTRAMUSCULAR | Status: AC
  Filled 2020-01-07: qty 1

## 2020-01-07 MED ORDER — LACTATED RINGERS IV SOLN: INTRAVENOUS | Status: DC

## 2020-01-07 MED ORDER — LIDOCAINE 2% (20 MG/ML) 5 ML SYRINGE
INTRAMUSCULAR | Status: AC
  Filled 2020-01-07: qty 5

## 2020-01-07 MED ORDER — KETAMINE HCL 50 MG/5ML IJ SOSY
PREFILLED_SYRINGE | INTRAMUSCULAR | Status: AC
  Administered 2020-01-07: 10 mg via INTRAVENOUS
  Filled 2020-01-07: qty 5

## 2020-01-07 MED ORDER — OXYCODONE HCL 5 MG PO TABS: 5.0000 mg | ORAL_TABLET | ORAL | Status: DC | PRN

## 2020-01-07 MED ORDER — PANTOPRAZOLE SODIUM 40 MG PO TBEC
40.0000 mg | DELAYED_RELEASE_TABLET | Freq: Every day | ORAL | Status: DC
  Administered 2020-01-07 – 2020-01-10 (×4): 40 mg via ORAL
  Filled 2020-01-07 (×4): qty 1

## 2020-01-07 MED ORDER — FOLIC ACID 1 MG PO TABS
1.0000 mg | ORAL_TABLET | Freq: Every day | ORAL | Status: DC
  Administered 2020-01-07 – 2020-01-10 (×4): 1 mg via ORAL
  Filled 2020-01-07 (×4): qty 1

## 2020-01-07 MED ORDER — CHLORHEXIDINE GLUCONATE 4 % EX LIQD
60.0000 mL | Freq: Once | CUTANEOUS | Status: AC
  Administered 2020-01-07: 4 via TOPICAL
  Filled 2020-01-07 (×2): qty 60

## 2020-01-07 MED ORDER — CEFAZOLIN SODIUM-DEXTROSE 2-4 GM/100ML-% IV SOLN
2.0000 g | INTRAVENOUS | Status: AC
  Administered 2020-01-07: 2 g via INTRAVENOUS
  Filled 2020-01-07: qty 100

## 2020-01-07 MED ORDER — MIDAZOLAM HCL 2 MG/2ML IJ SOLN
INTRAMUSCULAR | Status: AC
  Administered 2020-01-07: 1 mg via INTRAVENOUS
  Filled 2020-01-07: qty 2

## 2020-01-07 MED ORDER — SODIUM CHLORIDE 0.9 % IV BOLUS
1000.0000 mL | Freq: Once | INTRAVENOUS | Status: AC
  Administered 2020-01-07: 1000 mL via INTRAVENOUS

## 2020-01-07 MED ORDER — FLEET ENEMA 7-19 GM/118ML RE ENEM: 1.0000 | ENEMA | Freq: Once | RECTAL | Status: DC | PRN

## 2020-01-07 MED ORDER — FENTANYL CITRATE (PF) 250 MCG/5ML IJ SOLN
INTRAMUSCULAR | Status: DC | PRN
  Administered 2020-01-07: 50 ug via INTRAVENOUS
  Administered 2020-01-07: 150 ug via INTRAVENOUS
  Administered 2020-01-07: 50 ug via INTRAVENOUS

## 2020-01-07 MED ORDER — DIPHENHYDRAMINE HCL 50 MG/ML IJ SOLN
INTRAMUSCULAR | Status: DC | PRN
  Administered 2020-01-07: 12.5 mg via INTRAVENOUS

## 2020-01-07 MED ORDER — PROPOFOL 10 MG/ML IV BOLUS
INTRAVENOUS | Status: AC
  Filled 2020-01-07: qty 20

## 2020-01-07 MED ORDER — KETAMINE HCL 50 MG/5ML IJ SOSY: 10.0000 mg | PREFILLED_SYRINGE | Freq: Once | INTRAMUSCULAR | Status: AC

## 2020-01-07 MED ORDER — METOCLOPRAMIDE HCL 5 MG/ML IJ SOLN: 5.0000 mg | Freq: Three times a day (TID) | INTRAMUSCULAR | Status: DC | PRN

## 2020-01-07 MED ORDER — CHLORHEXIDINE GLUCONATE CLOTH 2 % EX PADS
6.0000 | MEDICATED_PAD | Freq: Every day | CUTANEOUS | Status: DC
  Administered 2020-01-08 – 2020-01-10 (×2): 6 via TOPICAL

## 2020-01-07 MED ORDER — ONDANSETRON HCL 4 MG PO TABS
4.0000 mg | ORAL_TABLET | Freq: Four times a day (QID) | ORAL | Status: DC | PRN
  Filled 2020-01-07: qty 1

## 2020-01-07 MED ORDER — ASCORBIC ACID 500 MG PO TABS
1000.0000 mg | ORAL_TABLET | Freq: Every day | ORAL | Status: DC
  Administered 2020-01-07 – 2020-01-10 (×4): 1000 mg via ORAL
  Filled 2020-01-07 (×4): qty 2

## 2020-01-07 MED ORDER — ROCURONIUM BROMIDE 10 MG/ML (PF) SYRINGE
PREFILLED_SYRINGE | INTRAVENOUS | Status: DC | PRN
  Administered 2020-01-07: 60 mg via INTRAVENOUS
  Administered 2020-01-07: 30 mg via INTRAVENOUS
  Administered 2020-01-07: 20 mg via INTRAVENOUS

## 2020-01-07 MED ORDER — SENNA 8.6 MG PO TABS
1.0000 | ORAL_TABLET | Freq: Two times a day (BID) | ORAL | Status: DC
  Administered 2020-01-07 – 2020-01-09 (×4): 8.6 mg via ORAL
  Filled 2020-01-07 (×4): qty 1

## 2020-01-07 MED ORDER — MEPERIDINE HCL 25 MG/ML IJ SOLN: 6.2500 mg | INTRAMUSCULAR | Status: DC | PRN

## 2020-01-07 MED ORDER — MIDAZOLAM HCL 5 MG/5ML IJ SOLN
INTRAMUSCULAR | Status: DC | PRN
  Administered 2020-01-07: 4 mg via INTRAVENOUS

## 2020-01-07 MED ORDER — METHOCARBAMOL 500 MG PO TABS
750.0000 mg | ORAL_TABLET | Freq: Three times a day (TID) | ORAL | Status: DC
  Administered 2020-01-07 – 2020-01-10 (×9): 750 mg via ORAL
  Filled 2020-01-07 (×9): qty 2

## 2020-01-07 MED ORDER — ONDANSETRON HCL 4 MG PO TABS: 4.0000 mg | ORAL_TABLET | Freq: Four times a day (QID) | ORAL | Status: DC | PRN

## 2020-01-07 MED ORDER — PROMETHAZINE HCL 25 MG/ML IJ SOLN: 6.2500 mg | INTRAMUSCULAR | Status: DC | PRN

## 2020-01-07 MED ORDER — ACETAMINOPHEN 500 MG PO TABS
1000.0000 mg | ORAL_TABLET | Freq: Four times a day (QID) | ORAL | Status: DC
  Administered 2020-01-07: 1000 mg via ORAL
  Filled 2020-01-07: qty 2

## 2020-01-07 MED ORDER — MORPHINE SULFATE (PF) 2 MG/ML IV SOLN: 0.5000 mg | INTRAVENOUS | Status: DC | PRN

## 2020-01-07 MED ORDER — DEXAMETHASONE SODIUM PHOSPHATE 10 MG/ML IJ SOLN
INTRAMUSCULAR | Status: DC | PRN
  Administered 2020-01-07: 5 mg via INTRAVENOUS

## 2020-01-07 MED ORDER — METHOCARBAMOL 500 MG PO TABS: 500.0000 mg | ORAL_TABLET | Freq: Four times a day (QID) | ORAL | Status: DC | PRN

## 2020-01-07 MED ORDER — FENTANYL CITRATE (PF) 100 MCG/2ML IJ SOLN: 50.0000 ug | Freq: Once | INTRAMUSCULAR | Status: AC

## 2020-01-07 MED ORDER — MIDAZOLAM HCL 2 MG/2ML IJ SOLN: 1.0000 mg | Freq: Once | INTRAMUSCULAR | Status: AC

## 2020-01-07 MED ORDER — POVIDONE-IODINE 10 % EX SWAB: 2.0000 "application " | Freq: Once | CUTANEOUS | Status: DC

## 2020-01-07 MED ORDER — LORAZEPAM 2 MG/ML IJ SOLN: 0.0000 mg | Freq: Four times a day (QID) | INTRAMUSCULAR | Status: AC

## 2020-01-07 MED ORDER — ROCURONIUM BROMIDE 10 MG/ML (PF) SYRINGE
PREFILLED_SYRINGE | INTRAVENOUS | Status: AC
  Filled 2020-01-07: qty 10

## 2020-01-07 MED ORDER — DIPHENHYDRAMINE HCL 50 MG/ML IJ SOLN
INTRAMUSCULAR | Status: AC
  Filled 2020-01-07: qty 1

## 2020-01-07 MED ORDER — HYDROMORPHONE HCL 1 MG/ML IJ SOLN
0.2500 mg | INTRAMUSCULAR | Status: DC | PRN
  Administered 2020-01-07: 0.25 mg via INTRAVENOUS

## 2020-01-07 MED ORDER — KETAMINE HCL 50 MG/5ML IJ SOSY
20.0000 mg | PREFILLED_SYRINGE | Freq: Once | INTRAMUSCULAR | Status: AC
  Administered 2020-01-07: 20 mg via INTRAVENOUS
  Filled 2020-01-07: qty 5

## 2020-01-07 MED ORDER — BISACODYL 10 MG RE SUPP: 10.0000 mg | Freq: Every day | RECTAL | Status: DC | PRN

## 2020-01-07 MED ORDER — MUPIROCIN 2 % EX OINT
1.0000 "application " | TOPICAL_OINTMENT | Freq: Two times a day (BID) | CUTANEOUS | Status: DC
  Administered 2020-01-07 – 2020-01-10 (×6): 1 via NASAL
  Filled 2020-01-07 (×2): qty 22

## 2020-01-07 MED ORDER — METOCLOPRAMIDE HCL 5 MG PO TABS: 5.0000 mg | ORAL_TABLET | Freq: Three times a day (TID) | ORAL | Status: DC | PRN

## 2020-01-07 MED ORDER — ENOXAPARIN SODIUM 40 MG/0.4ML ~~LOC~~ SOLN
40.0000 mg | SUBCUTANEOUS | Status: DC
  Administered 2020-01-08 – 2020-01-10 (×3): 40 mg via SUBCUTANEOUS
  Filled 2020-01-07 (×3): qty 0.4

## 2020-01-07 MED ORDER — LORAZEPAM 1 MG PO TABS
1.0000 mg | ORAL_TABLET | ORAL | Status: AC | PRN
  Administered 2020-01-09: 1 mg via ORAL
  Filled 2020-01-07: qty 1

## 2020-01-07 MED ORDER — FENTANYL CITRATE (PF) 100 MCG/2ML IJ SOLN
INTRAMUSCULAR | Status: AC
  Administered 2020-01-07: 50 ug via INTRAVENOUS
  Filled 2020-01-07: qty 2

## 2020-01-07 MED ORDER — HYDROCODONE-ACETAMINOPHEN 7.5-325 MG PO TABS
1.0000 | ORAL_TABLET | ORAL | Status: DC | PRN
  Administered 2020-01-07 – 2020-01-09 (×5): 2 via ORAL
  Administered 2020-01-09 – 2020-01-10 (×3): 1 via ORAL
  Filled 2020-01-07: qty 2
  Filled 2020-01-07 (×2): qty 1
  Filled 2020-01-07 (×5): qty 2
  Filled 2020-01-07: qty 1

## 2020-01-07 MED ORDER — POTASSIUM CHLORIDE IN NACL 20-0.9 MEQ/L-% IV SOLN
INTRAVENOUS | Status: DC
  Filled 2020-01-07 (×3): qty 1000

## 2020-01-07 MED ORDER — HYDROCODONE-ACETAMINOPHEN 5-325 MG PO TABS
1.0000 | ORAL_TABLET | ORAL | Status: DC | PRN
  Administered 2020-01-07 – 2020-01-08 (×3): 2 via ORAL
  Administered 2020-01-10: 1 via ORAL
  Filled 2020-01-07 (×2): qty 2
  Filled 2020-01-07: qty 1
  Filled 2020-01-07: qty 2

## 2020-01-07 MED ORDER — ACETAMINOPHEN 325 MG PO TABS
650.0000 mg | ORAL_TABLET | Freq: Two times a day (BID) | ORAL | Status: DC
  Administered 2020-01-09 – 2020-01-10 (×3): 650 mg via ORAL
  Filled 2020-01-07 (×6): qty 2

## 2020-01-07 MED ORDER — LORAZEPAM 2 MG/ML IJ SOLN: 1.0000 mg | INTRAMUSCULAR | Status: AC | PRN

## 2020-01-07 SURGICAL SUPPLY — 56 items
BIT DRILL CALIBRATED 4.2 (BIT) ×1 IMPLANT
BIT DRILL SHORT 4.2 (BIT) ×1 IMPLANT
BNDG COHESIVE 4X5 TAN STRL (GAUZE/BANDAGES/DRESSINGS) ×3 IMPLANT
BNDG COHESIVE 6X5 TAN STRL LF (GAUZE/BANDAGES/DRESSINGS) IMPLANT
BRUSH SCRUB EZ PLAIN DRY (MISCELLANEOUS) ×6 IMPLANT
COVER PERINEAL POST (MISCELLANEOUS) ×3 IMPLANT
COVER SURGICAL LIGHT HANDLE (MISCELLANEOUS) ×6 IMPLANT
COVER WAND RF STERILE (DRAPES) IMPLANT
DRAPE C-ARMOR (DRAPES) ×3 IMPLANT
DRAPE HALF SHEET 40X57 (DRAPES) IMPLANT
DRAPE ORTHO SPLIT 77X108 STRL (DRAPES) ×4
DRAPE STERI IOBAN 125X83 (DRAPES) ×3 IMPLANT
DRAPE SURG ORHT 6 SPLT 77X108 (DRAPES) ×2 IMPLANT
DRAPE U-SHAPE 47X51 STRL (DRAPES) ×3 IMPLANT
DRILL BIT CALIBRATED 4.2 (BIT) ×3
DRILL BIT SHORT 4.2 (BIT) ×2
DRSG EMULSION OIL 3X3 NADH (GAUZE/BANDAGES/DRESSINGS) IMPLANT
DRSG MEPILEX BORDER 4X4 (GAUZE/BANDAGES/DRESSINGS) ×3 IMPLANT
DRSG MEPILEX BORDER 4X8 (GAUZE/BANDAGES/DRESSINGS) ×3 IMPLANT
ELECT REM PT RETURN 9FT ADLT (ELECTROSURGICAL) ×3
ELECTRODE REM PT RTRN 9FT ADLT (ELECTROSURGICAL) ×1 IMPLANT
GLOVE BIO SURGEON STRL SZ7.5 (GLOVE) ×3 IMPLANT
GLOVE BIO SURGEON STRL SZ8 (GLOVE) ×3 IMPLANT
GLOVE BIOGEL PI IND STRL 7.5 (GLOVE) ×1 IMPLANT
GLOVE BIOGEL PI IND STRL 8 (GLOVE) ×1 IMPLANT
GLOVE BIOGEL PI INDICATOR 7.5 (GLOVE) ×2
GLOVE BIOGEL PI INDICATOR 8 (GLOVE) ×2
GOWN STRL REUS W/ TWL LRG LVL3 (GOWN DISPOSABLE) ×2 IMPLANT
GOWN STRL REUS W/ TWL XL LVL3 (GOWN DISPOSABLE) ×1 IMPLANT
GOWN STRL REUS W/TWL LRG LVL3 (GOWN DISPOSABLE) ×4
GOWN STRL REUS W/TWL XL LVL3 (GOWN DISPOSABLE) ×2
GUIDEWIRE 3.2X400 (WIRE) ×3 IMPLANT
KIT BASIN OR (CUSTOM PROCEDURE TRAY) ×3 IMPLANT
KIT TURNOVER KIT B (KITS) ×3 IMPLANT
MANIFOLD NEPTUNE II (INSTRUMENTS) ×3 IMPLANT
NAIL CANN FRN TI 380 RT (Nail) ×3 IMPLANT
NS IRRIG 1000ML POUR BTL (IV SOLUTION) ×3 IMPLANT
PACK GENERAL/GYN (CUSTOM PROCEDURE TRAY) ×3 IMPLANT
PAD ARMBOARD 7.5X6 YLW CONV (MISCELLANEOUS) ×6 IMPLANT
REAMER ROD DEEP FLUTE 2.5X950 (INSTRUMENTS) ×3 IMPLANT
SCREW LOCKING  5.0X64MM (Screw) ×2 IMPLANT
SCREW LOCKING 5.0MMX44MM (Screw) ×3 IMPLANT
SCREW LOCKING 5.0X38MM (Screw) ×3 IMPLANT
SCREW LOCKING 5.0X64MM (Screw) ×1 IMPLANT
STAPLER VISISTAT 35W (STAPLE) ×3 IMPLANT
STOCKINETTE IMPERVIOUS LG (DRAPES) ×3 IMPLANT
SUT ETHILON 3 0 PS 1 (SUTURE) ×3 IMPLANT
SUT VIC AB 0 CT1 27 (SUTURE) ×2
SUT VIC AB 0 CT1 27XBRD ANBCTR (SUTURE) ×1 IMPLANT
SUT VIC AB 1 CT1 27 (SUTURE) ×2
SUT VIC AB 1 CT1 27XBRD ANBCTR (SUTURE) ×1 IMPLANT
SUT VIC AB 2-0 CT1 27 (SUTURE) ×2
SUT VIC AB 2-0 CT1 TAPERPNT 27 (SUTURE) ×1 IMPLANT
TOWEL GREEN STERILE (TOWEL DISPOSABLE) ×6 IMPLANT
TOWEL GREEN STERILE FF (TOWEL DISPOSABLE) ×3 IMPLANT
WATER STERILE IRR 1000ML POUR (IV SOLUTION) IMPLANT

## 2020-01-07 NOTE — ED Notes (Signed)
Pt transported to CT ?
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
## 2020-01-07 NOTE — Progress Notes (Signed)
PT. REFUSED IV insertion . RN notified, RN at bedside educted the patient for need of IV . Pt. Still refused and would like to inform MD.
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
## 2020-01-07 NOTE — Progress Notes (Addendum)
RN spoke with IP and original positive COVID test was on 12/23/19. AC aware.  RN spoke with IP and because it has been over 10 days since original test and pt is asymptomatic there are not precautions needed.

## 2020-01-07 NOTE — Anesthesia Preprocedure Evaluation (Addendum)
Anesthesia Evaluation  Patient identified by MRN, date of birth, ID band Patient awake    Reviewed: Allergy & Precautions, NPO status , Patient's Chart, lab work & pertinent test results  Airway Mallampati: I       Dental no notable dental hx. (+) Teeth Intact   Pulmonary    Pulmonary exam normal breath sounds clear to auscultation       Cardiovascular negative cardio ROS Normal cardiovascular exam Rhythm:Regular Rate:Normal     Neuro/Psych negative neurological ROS  negative psych ROS   GI/Hepatic negative GI ROS, Neg liver ROS,   Endo/Other  negative endocrine ROS  Renal/GU negative Renal ROS  negative genitourinary   Musculoskeletal   Abdominal Normal abdominal exam  (+)   Peds  Hematology negative hematology ROS (+)   Anesthesia Other Findings   Reproductive/Obstetrics                            Anesthesia Physical Anesthesia Plan  ASA: II  Anesthesia Plan: General   Post-op Pain Management:    Induction: Intravenous and Rapid sequence  PONV Risk Score and Plan: 4 or greater and Ondansetron, Dexamethasone, Midazolam and Treatment may vary due to age or medical condition  Airway Management Planned: Oral ETT  Additional Equipment: None  Intra-op Plan:   Post-operative Plan: Extubation in OR  Informed Consent: I have reviewed the patients History and Physical, chart, labs and discussed the procedure including the risks, benefits and alternatives for the proposed anesthesia with the patient or authorized representative who has indicated his/her understanding and acceptance.     Dental advisory given  Plan Discussed with: CRNA  Anesthesia Plan Comments:         Anesthesia Quick Evaluation
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
## 2020-01-07 NOTE — Transfer of Care (Signed)
Immediate Anesthesia Transfer of Care Note  Patient: Audrey Schultz  Procedure(s) Performed: INTRAMEDULLARY (IM) NAIL INTERTROCHANTRIC (Right )  Patient Location: PACU  Anesthesia Type:General  Level of Consciousness: awake and patient cooperative  Airway & Oxygen Therapy: Patient Spontanous Breathing and Patient connected to nasal cannula oxygen  Post-op Assessment: Report given to RN and Post -op Vital signs reviewed and stable  Post vital signs: Reviewed and stable  Last Vitals:  Vitals Value Taken Time  BP 146/102 01/07/20 1557  Temp    Pulse 110 01/07/20 1600  Resp 22 01/07/20 1600  SpO2 98 % 01/07/20 1600  Vitals shown include unvalidated device data.  Last Pain:  Vitals:   01/07/20 0818  TempSrc: Oral  PainSc: 9          Complications: No apparent anesthesia complications
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
## 2020-01-07 NOTE — ED Notes (Signed)
Date and time results received: 01/06/2102:33   (use smartphrase ".now" to insert current time)  Test: COVID  Critical Value: positive  Name of Provider Notified: Dr. Teryl Lucy  Orders Received? Or Actions Taken?: Orders Received - See Orders for details

## 2020-01-07 NOTE — ED Notes (Signed)
Patient screaming out in pain.  Pain assessed.  See [facility]

## 2020-01-07 NOTE — Progress Notes (Signed)
Orthopedic Tech Progress Note Patient Details:  Zoella Roberti Mar 26, 1991 [id number] I  Applied an Over Head Frame with Trapeze for patient. Patient ID: Audrey Schultz, female   DOB: 10-Sep-1991, 29 y.o.   MRN: [id number]   Donald Pore 01/07/2020, 7:02 PM

## 2020-01-07 NOTE — Plan of Care (Signed)

## 2020-01-07 NOTE — Anesthesia Procedure Notes (Signed)
Procedure Name: Intubation Date/Time: 01/07/2020 1:53 PM Performed by: Trinna Post., CRNA Pre-anesthesia Checklist: Patient identified, Emergency Drugs available, Suction available, Patient being monitored and Timeout performed Patient Re-evaluated:Patient Re-evaluated prior to induction Oxygen Delivery Method: Circle system utilized Preoxygenation: Pre-oxygenation with 100% oxygen Induction Type: IV induction Ventilation: Mask ventilation without difficulty Laryngoscope Size: Mac and 3 Grade View: Grade I Tube type: Oral Tube size: 7.0 mm Number of attempts: 1 Airway Equipment and Method: Stylet Placement Confirmation: ETT inserted through vocal cords under direct vision,  positive ETCO2 and breath sounds checked- equal and bilateral Secured at: 22 cm Tube secured with: Tape Dental Injury: Teeth and Oropharynx as per pre-operative assessment

## 2020-01-07 NOTE — ED Notes (Signed)
Bilateral dorsalis pedis pulses present via Doppler.

## 2020-01-07 NOTE — ED Notes (Signed)
Right traction placed by ortho.

## 2020-01-07 NOTE — Progress Notes (Signed)
Orthopedic Tech Progress Note Patient Details:  Audrey Schultz [birth year] [id number]  Musculoskeletal Traction Type of Traction: [place] Skin Traction Traction Location: RLE Traction Weight: 10 lbs   Post Interventions Patient Tolerated: Fair Instructions Provided: Care of device   Audrey Schultz N Annison Birchard 01/07/2020, 2:16 AM

## 2020-01-07 NOTE — ED Notes (Signed)
Ortho Dr. Howell Rucks at bedside

## 2020-01-08 ENCOUNTER — Inpatient Hospital Stay (HOSPITAL_COMMUNITY): Payer: Medicaid Other

## 2020-01-08 DIAGNOSIS — R079 Chest pain, unspecified: Secondary | ICD-10-CM | POA: Diagnosis not present

## 2020-01-08 LAB — CBC
HCT: 33 % — ABNORMAL LOW (ref 36.0–46.0)
Hemoglobin: 10.5 g/dL — ABNORMAL LOW (ref 12.0–15.0)
MCH: 30.2 pg (ref 26.0–34.0)
MCHC: 31.8 g/dL (ref 30.0–36.0)
MCV: 94.8 fL (ref 80.0–100.0)
Platelets: 208 10*3/uL (ref 150–400)
RBC: 3.48 MIL/uL — ABNORMAL LOW (ref 3.87–5.11)
RDW: 13.4 % (ref 11.5–15.5)
WBC: 13.7 10*3/uL — ABNORMAL HIGH (ref 4.0–10.5)
nRBC: 0 % (ref 0.0–0.2)

## 2020-01-08 MED ORDER — IOHEXOL 350 MG/ML SOLN
75.0000 mL | Freq: Once | INTRAVENOUS | Status: AC | PRN
  Administered 2020-01-08: 75 mL via INTRAVENOUS

## 2020-01-08 NOTE — Progress Notes (Signed)
Subjective: 1 Day Post-Op Procedure(s) (LRB): INTRAMEDULLARY (IM) NAIL INTERTROCHANTRIC (Right) Patient reports pain as 3 on 0-10 scale.   Patient's biggest complaint today is chest pain   Right femur pain under control Objective: Vital signs in last 24 hours: Temp:  [97 F (36.1 C)-98.9 F (37.2 C)] 98.9 F (37.2 C) (02/06 0732) Pulse Rate:  [87-112] 89 (02/06 0732) Resp:  [15-19] 17 (02/06 0732) BP: (105-146)/(59-102) 114/71 (02/06 0732) SpO2:  [96 %-100 %] 100 % (02/06 0732) FiO2 (%):  [28 %] 28 % (02/05 1658)  Intake/Output from previous day: 02/05 0701 - 02/06 0700 In: 2641.3 [I.V.:2441.3; IV Piggyback:200] Out: 750 [Urine:600; Blood:150] Intake/Output this shift: Total I/O In: 240 [P.O.:240] Out: 300 [Urine:300]  Recent Labs    01/07/20 0023 01/07/20 0046 01/07/20 1715 01/08/20 0706  HGB 13.7 15.0 12.5 10.5*   Recent Labs    01/07/20 1715 01/08/20 0706  WBC 12.8* 13.7*  RBC 4.12 3.48*  HCT 38.6 33.0*  PLT 236 208   Recent Labs    01/07/20 0023 01/07/20 0023 01/07/20 0046 01/07/20 1715  NA 146*  --  144  --   K 3.5  --  3.4*  --   CL 110  --  110  --   CO2 23  --   --   --   BUN 9  --  7  --   CREATININE 0.67   < > 0.80 0.66  GLUCOSE 111*  --  104*  --   CALCIUM 9.1  --   --   --    < > = values in this interval not displayed.   Recent Labs    01/07/20 0023  INR 1.1    ABD soft Neurovascular intact Sensation intact distally Incision: dressing C/D/I   Assessment/Plan: 1 Day Post-Op Procedure(s) (LRB): INTRAMEDULLARY (IM) NAIL INTERTROCHANTRIC (Right)   Patient had some ST elevation on her admission EKG  Now she is post op and COVID positive that both contribute to hypercoagulability.  I am ordering a new EKG and a CHEST CT angio looking for a PE.         Briannon Boggio J Jontae Adebayo 01/08/2020, 12:19 PM
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
## 2020-01-09 LAB — CBC
HCT: 29.4 % — ABNORMAL LOW (ref 36.0–46.0)
Hemoglobin: 9.6 g/dL — ABNORMAL LOW (ref 12.0–15.0)
MCH: 30.3 pg (ref 26.0–34.0)
MCHC: 32.7 g/dL (ref 30.0–36.0)
MCV: 92.7 fL (ref 80.0–100.0)
Platelets: 201 10*3/uL (ref 150–400)
RBC: 3.17 MIL/uL — ABNORMAL LOW (ref 3.87–5.11)
RDW: 13.3 % (ref 11.5–15.5)
WBC: 10.4 10*3/uL (ref 4.0–10.5)
nRBC: 0 % (ref 0.0–0.2)

## 2020-01-09 MED ORDER — MUPIROCIN 2 % EX OINT: 1.0000 "application " | TOPICAL_OINTMENT | Freq: Two times a day (BID) | CUTANEOUS | 0 refills | Status: DC

## 2020-01-09 MED ORDER — FOLIC ACID 1 MG PO TABS: 1.0000 mg | ORAL_TABLET | Freq: Every day | ORAL | 3 refills | Status: DC

## 2020-01-09 MED ORDER — ADULT MULTIVITAMIN W/MINERALS CH: 1.0000 | ORAL_TABLET | Freq: Every day | ORAL | 3 refills | Status: DC

## 2020-01-09 MED ORDER — HYDROCODONE-ACETAMINOPHEN 5-325 MG PO TABS: ORAL_TABLET | ORAL | 0 refills | Status: DC

## 2020-01-09 MED ORDER — THIAMINE HCL 100 MG PO TABS: 100.0000 mg | ORAL_TABLET | Freq: Every day | ORAL | 3 refills | Status: DC

## 2020-01-09 MED ORDER — SENNOSIDES-DOCUSATE SODIUM 8.6-50 MG PO TABS: 1.0000 | ORAL_TABLET | Freq: Every evening | ORAL | 0 refills | Status: DC | PRN

## 2020-01-09 MED ORDER — DOCUSATE SODIUM 100 MG PO CAPS: 100.0000 mg | ORAL_CAPSULE | Freq: Two times a day (BID) | ORAL | 0 refills | Status: DC

## 2020-01-09 MED ORDER — ENOXAPARIN SODIUM 40 MG/0.4ML ~~LOC~~ SOLN: 40.0000 mg | SUBCUTANEOUS | 0 refills | Status: DC

## 2020-01-09 MED ORDER — VITAMIN D3 25 MCG PO TABS: 2000.0000 [IU] | ORAL_TABLET | Freq: Two times a day (BID) | ORAL | 3 refills | Status: DC

## 2020-01-09 MED ORDER — METHOCARBAMOL 500 MG PO TABS: 500.0000 mg | ORAL_TABLET | Freq: Three times a day (TID) | ORAL | 0 refills | Status: DC

## 2020-01-09 MED ORDER — ASCORBIC ACID 1000 MG PO TABS: 1000.0000 mg | ORAL_TABLET | Freq: Every day | ORAL | 3 refills | Status: DC

## 2020-01-09 NOTE — Plan of Care (Signed)
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
## 2020-01-09 NOTE — Plan of Care (Signed)
  Problem: Education: Goal: Knowledge of General Education information will improve Description Including pain rating scale, medication(s)/side effects and non-pharmacologic comfort measures Outcome: Progressing   
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
## 2020-01-09 NOTE — Progress Notes (Addendum)
Pt aware of d/c orders; CM was contacted for DME needs earlier this evening, awaiting arrival of those. Will continue to monitor  1926- RN attempted to contact CM several times without answer, then again tried to contact CM in ED to order DME, a voicemail/message was left for each. Information about pt needs were given to next shift in attempt to get pt home this evening.

## 2020-01-10 ENCOUNTER — Encounter: Payer: Self-pay | Admitting: *Deleted

## 2020-01-10 ENCOUNTER — Encounter (HOSPITAL_COMMUNITY): Payer: Self-pay | Admitting: Orthopedic Surgery

## 2020-01-10 ENCOUNTER — Encounter (HOSPITAL_COMMUNITY): Payer: Self-pay | Admitting: Emergency Medicine

## 2020-01-10 DIAGNOSIS — F1290 Cannabis use, unspecified, uncomplicated: Secondary | ICD-10-CM | POA: Diagnosis present

## 2020-01-10 DIAGNOSIS — F17200 Nicotine dependence, unspecified, uncomplicated: Secondary | ICD-10-CM

## 2020-01-10 DIAGNOSIS — S2249XA Multiple fractures of ribs, unspecified side, initial encounter for closed fracture: Secondary | ICD-10-CM | POA: Diagnosis present

## 2020-01-10 DIAGNOSIS — F10929 Alcohol use, unspecified with intoxication, unspecified: Secondary | ICD-10-CM | POA: Diagnosis present

## 2020-01-10 DIAGNOSIS — S2239XA Fracture of one rib, unspecified side, initial encounter for closed fracture: Secondary | ICD-10-CM | POA: Diagnosis present

## 2020-01-10 DIAGNOSIS — E559 Vitamin D deficiency, unspecified: Secondary | ICD-10-CM

## 2020-01-10 HISTORY — DX: Nicotine dependence, unspecified, uncomplicated: F17.200

## 2020-01-10 HISTORY — DX: Vitamin D deficiency, unspecified: E55.9

## 2020-01-10 MED ORDER — FOLIC ACID 1 MG PO TABS: 1.0000 mg | ORAL_TABLET | Freq: Every day | ORAL | 2 refills | Status: DC

## 2020-01-10 MED ORDER — ASCORBIC ACID 1000 MG PO TABS: 1000.0000 mg | ORAL_TABLET | Freq: Every day | ORAL | 2 refills | Status: AC

## 2020-01-10 MED ORDER — METHOCARBAMOL 500 MG PO TABS: 500.0000 mg | ORAL_TABLET | Freq: Three times a day (TID) | ORAL | 0 refills | Status: DC | PRN

## 2020-01-10 MED ORDER — ACETAMINOPHEN 325 MG PO TABS: 650.0000 mg | ORAL_TABLET | Freq: Two times a day (BID) | ORAL | 0 refills | Status: DC

## 2020-01-10 MED ORDER — ENOXAPARIN (LOVENOX) PATIENT EDUCATION KIT
PACK | Freq: Once | Status: DC
  Filled 2020-01-10: qty 1

## 2020-01-10 MED ORDER — HYDROCODONE-ACETAMINOPHEN 7.5-325 MG PO TABS: 1.0000 | ORAL_TABLET | Freq: Four times a day (QID) | ORAL | 0 refills | Status: DC | PRN

## 2020-01-10 MED ORDER — DOCUSATE SODIUM 100 MG PO CAPS: 100.0000 mg | ORAL_CAPSULE | Freq: Two times a day (BID) | ORAL | 0 refills | Status: DC

## 2020-01-10 MED ORDER — MUPIROCIN 2 % EX OINT: 1.0000 "application " | TOPICAL_OINTMENT | Freq: Two times a day (BID) | CUTANEOUS | 0 refills | Status: DC

## 2020-01-10 MED ORDER — VITAMIN D3 25 MCG PO TABS: 2000.0000 [IU] | ORAL_TABLET | Freq: Two times a day (BID) | ORAL | 3 refills | Status: AC

## 2020-01-10 MED ORDER — ADULT MULTIVITAMIN W/MINERALS CH: 1.0000 | ORAL_TABLET | Freq: Every day | ORAL | 2 refills | Status: AC

## 2020-01-10 MED ORDER — ENOXAPARIN SODIUM 40 MG/0.4ML ~~LOC~~ SOLN: 40.0000 mg | SUBCUTANEOUS | 0 refills | Status: DC

## 2020-01-10 MED ORDER — ENOXAPARIN (LOVENOX) PATIENT EDUCATION KIT: 1.0000 | PACK | Freq: Once | 0 refills | Status: AC

## 2020-01-10 MED ORDER — THIAMINE HCL 100 MG PO TABS: 100.0000 mg | ORAL_TABLET | Freq: Every day | ORAL | 1 refills | Status: AC

## 2020-01-10 MED FILL — MUPIROCIN 2% OINTMENT: 2 | 7 days supply | Qty: 22 | Fill #0

## 2020-01-10 MED FILL — VITAMIN D3 25 MCG TABS: 25 | 30 days supply | Qty: 120 | Fill #0

## 2020-01-10 MED FILL — VITAMIN B-1 100 MG TABS: 100 | 30 days supply | Qty: 30 | Fill #0

## 2020-01-10 MED FILL — ENOXAPARIN SODIUM 40 MG/0.4: 40 | 21 days supply | Qty: 8 | Fill #0

## 2020-01-10 MED FILL — VITAMIN C 500 MG TABLET: 500 | 30 days supply | Qty: 60 | Fill #0

## 2020-01-10 MED FILL — ACETAMINOPHEN 325 MG TABS: 325 | 15 days supply | Qty: 60 | Fill #0

## 2020-01-10 MED FILL — HYDROCODON-APAP 7.5-325: 7.5-325 | 7 days supply | Qty: 50 | Fill #0

## 2020-01-10 MED FILL — FOLIC ACID 1 MG TABS: 1 | 30 days supply | Qty: 30 | Fill #0

## 2020-01-10 MED FILL — METHOCARBAMOL 500 MG TABS: 500 | 20 days supply | Qty: 60 | Fill #0

## 2020-01-10 MED FILL — DOK 100 MG CAPS: 100 | 10 days supply | Qty: 20 | Fill #0

## 2020-01-10 NOTE — Progress Notes (Signed)
Audrey Schultz to be D/C'd home per MD order.  Discussed prescriptions and follow up appointments with the patient. Prescriptions were delivered to room from [place] given to patient, medication list explained in detail. Pt verbalized understanding. Also educated pt's sister on how to give lovenox injections, pt will be going home with for 21 days. Sister verbalized understanding  Allergies as of 01/10/2020      Reactions   Shellfish Allergy Anaphylaxis      Medication List    TAKE these medications   acetaminophen 325 MG tablet Commonly known as: TYLENOL Take 2 tablets (650 mg total) by mouth every 12 (twelve) hours.   ascorbic acid 1000 MG tablet Commonly known as: VITAMIN C Take 1 tablet (1,000 mg total) by mouth daily.   docusate sodium 100 MG capsule Commonly known as: COLACE Take 1 capsule (100 mg total) by mouth 2 (two) times daily.   enoxaparin 40 MG/0.4ML injection Commonly known as: LOVENOX Inject 0.4 mLs (40 mg total) into the skin daily for 21 days.   enoxaparin Kit Commonly known as: LOVENOX 1 kit by Does not apply route once for 1 dose.   folic acid 1 MG tablet Commonly known as: FOLVITE Take 1 tablet (1 mg total) by mouth daily.   HYDROcodone-acetaminophen 7.5-325 MG tablet Commonly known as: NORCO Take 1-2 tablets by mouth every 6 (six) hours as needed for moderate pain or severe pain.   methocarbamol 500 MG tablet Commonly known as: ROBAXIN Take 1 tablet (500 mg total) by mouth every 8 (eight) hours as needed for muscle spasms.   multivitamin with minerals Tabs tablet Take 1 tablet by mouth daily.   mupirocin ointment 2 % Commonly known as: BACTROBAN Place 1 application into the nose 2 (two) times daily.   thiamine 100 MG tablet Take 1 tablet (100 mg total) by mouth daily.   Vitamin D3 25 MCG tablet Commonly known as: Vitamin D Take 2 tablets (2,000 Units total) by mouth 2 (two) times daily.            Durable Medical Equipment   (From admission, onward)         Start     Ordered   01/09/20 1717  For home use only DME 3 n 1  Once     01/09/20 1718   01/09/20 1717  For home use only DME Walker rolling  Once    Question Answer Comment  Walker: With 5 Inch Wheels   Patient needs a walker to treat with the following condition Closed fracture of right femur with routine healing, unspecified fracture morphology, unspecified portion of femur, subsequent encounter      01/09/20 1718           Discharge Care Instructions  (From admission, onward)         Start     Ordered   01/10/20 0000  Weight bearing as tolerated     01/10/20 1011          Vitals:   01/10/20 0726 01/10/20 1237  BP: (!) 139/96 110/71  Pulse: 84 78  Resp: 16 17  Temp: 99.1 F (37.3 C) 98.8 F (37.1 C)  SpO2: 100% 100%    IV catheter discontinued intact. Site without signs and symptoms of complications. Dressing and pressure applied. Heart monitor discontinued.  Pt denies pain at this time. No complaints noted.   An After Visit Summary was printed and given to the patient. Patient escorted via [place], and D/C home via  private auto.  [place] 01/10/2020 2:40 PM
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
## 2020-01-10 NOTE — Discharge Instructions (Addendum)
Orthopaedic Trauma Service Discharge Instructions   General Discharge Instructions  Orthopaedic Injuries:  Left femoral shaft fracture treated with intramedullary nailing  WEIGHT BEARING STATUS: Weightbearing as tolerated left leg with assistance (crutches or walker)  RANGE OF MOTION/ACTIVITY: Unrestricted range of motion left hip and knee.  Activity as tolerated.  Increase activity slowly  Bone health: Labs show that you have pretty significant vitamin D deficiency.  Continue to take vitamin D as it has been prescribed and vitamin C.  Wound Care: Daily wound care starting on 01/12/2020.  Please see instructions below Discharge Wound Care Instructions  Do NOT apply any ointments, solutions or lotions to pin sites or surgical wounds.  These prevent needed drainage and even though solutions like hydrogen peroxide kill bacteria, they also damage cells lining the pin sites that help fight infection.  Applying lotions or ointments can keep the wounds moist and can cause them to breakdown and open up as well. This can increase the risk for infection. When in doubt call the office.  Surgical incisions should be dressed daily.  If any drainage is noted, use one layer of adaptic, then gauze and tape.  Alternatively you can use a Mepilex type dressing which is the type of dressing you have currently.  These can be obtained at a medical supply store.  Once the incision is completely dry and without drainage, it may be left open to air out.  Showering may begin 36-48 hours later.  Cleaning gently with soap and water.   DVT/PE prophylaxis: Lovenox 40 mg subcutaneous injection daily x 21 days  Diet: as you were eating previously.  Can use over the counter stool softeners and bowel preparations, such as Miralax, to help with bowel movements.  Narcotics can be constipating.  Be sure to drink plenty of fluids  PAIN MEDICATION USE AND EXPECTATIONS  You have likely been given narcotic medications to  help control your pain.  After a traumatic event that results in an fracture (broken bone) with or without surgery, it is ok to use narcotic pain medications to help control one's pain.  We understand that everyone responds to pain differently and each individual patient will be evaluated on a regular basis for the continued need for narcotic medications. Ideally, narcotic medication use should last no more than 6-8 weeks (coinciding with fracture healing).   As a patient it is your responsibility as well to monitor narcotic medication use and report the amount and frequency you use these medications when you come to your office visit.   We would also advise that if you are using narcotic medications, you should take a dose prior to therapy to maximize you participation.  IF YOU ARE ON NARCOTIC MEDICATIONS IT IS NOT PERMISSIBLE TO OPERATE A MOTOR VEHICLE (MOTORCYCLE/CAR/TRUCK/MOPED) OR HEAVY MACHINERY DO NOT MIX NARCOTICS WITH OTHER CNS (CENTRAL NERVOUS SYSTEM) DEPRESSANTS SUCH AS ALCOHOL   STOP SMOKING OR USING NICOTINE PRODUCTS!!!!  As discussed nicotine severely impairs your body's ability to heal surgical and traumatic wounds but also impairs bone healing.  Wounds and bone heal by forming microscopic blood vessels (angiogenesis) and nicotine is a vasoconstrictor (essentially, shrinks blood vessels).  Therefore, if vasoconstriction occurs to these microscopic blood vessels they essentially disappear and are unable to deliver necessary nutrients to the healing tissue.  This is one modifiable factor that you can do to dramatically increase your chances of healing your injury.    (This means no smoking, no nicotine gum, patches, etc)  DO NOT  USE NONSTEROIDAL ANTI-INFLAMMATORY DRUGS (NSAID'S)  Using products such as Advil (ibuprofen), Aleve (naproxen), Motrin (ibuprofen) for additional pain control during fracture healing can delay and/or prevent the healing response.  If you would like to take over the  counter (OTC) medication, Tylenol (acetaminophen) is ok.  However, some narcotic medications that are given for pain control contain acetaminophen as well. Therefore, you should not exceed more than 4000 mg of tylenol in a day if you do not have liver disease.  Also note that there are may OTC medicines, such as cold medicines and allergy medicines that my contain tylenol as well.  If you have any questions about medications and/or interactions please ask your doctor/PA or your pharmacist.      ICE AND ELEVATE INJURED/OPERATIVE EXTREMITY  Using ice and elevating the injured extremity above your heart can help with swelling and pain control.  Icing in a pulsatile fashion, such as 20 minutes on and 20 minutes off, can be followed.    Do not place ice directly on skin. Make sure there is a barrier between to skin and the ice pack.    Using frozen items such as frozen peas works well as the conform nicely to the are that needs to be iced.  USE AN ACE WRAP OR TED HOSE FOR SWELLING CONTROL  In addition to icing and elevation, Ace wraps or TED hose are used to help limit and resolve swelling.  It is recommended to use Ace wraps or TED hose until you are informed to stop.    When using Ace Wraps start the wrapping distally (farthest away from the body) and wrap proximally (closer to the body)   Example: If you had surgery on your leg or thing and you do not have a splint on, start the ace wrap at the toes and work your way up to the thigh        If you had surgery on your upper extremity and do not have a splint on, start the ace wrap at your fingers and work your way up to the upper arm  IF YOU ARE IN A SPLINT OR CAST DO NOT REMOVE IT FOR ANY REASON   If your splint gets wet for any reason please contact the office immediately. You may shower in your splint or cast as long as you keep it dry.  This can be done by wrapping in a cast cover or garbage back (or similar)  Do Not stick any thing down your splint  or cast such as pencils, money, or hangers to try and scratch yourself with.  If you feel itchy take benadryl as prescribed on the bottle for itching  IF YOU ARE IN A CAM BOOT (BLACK BOOT)  You may remove boot periodically. Perform daily dressing changes as noted below.  Wash the liner of the boot regularly and wear a sock when wearing the boot. It is recommended that you sleep in the boot until told otherwise    Call office for the following:  Temperature greater than 101F  Persistent nausea and vomiting  Severe uncontrolled pain  Redness, tenderness, or signs of infection (pain, swelling, redness, odor or green/yellow discharge around the site)  Difficulty breathing, headache or visual disturbances  Hives  Persistent dizziness or light-headedness  Extreme fatigue  Any other questions or concerns you may have after discharge  In an emergency, call 911 or go to an Emergency Department at a nearby hospital    CALL THE OFFICE WITH  ANY QUESTIONS OR CONCERNS: [phone]   VISIT OUR WEBSITE FOR ADDITIONAL INFORMATION: [email or web address]   Alcohol Cessation Resources
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
## 2020-01-10 NOTE — TOC Transition Note (Signed)
Transition of Care [facility]) - CM/SW Discharge Note   Patient Details  Name: [name] MRN: [id number] Date of Birth: Dec 29, 1990  [facility] CM/SW Contact:  Truddie Hidden, LCSW Phone Number: 01/10/2020, 12:06 PM   Clinical Narrative:    Discharged home. DME will be delivered to room prior to discharge. ETOH cessation information added to AVS. No other needs at this time. Case closed to this CSW.   Final next level of care: Home/Self Care Barriers to Discharge: Barriers Resolved   Patient Goals and CMS Choice   CMS Medicare.gov Compare Post Acute Care list provided to:: Patient Choice offered to / list presented to : Patient  Discharge Placement                       Discharge Plan and Services                DME Arranged: Tub bench, Walker rolling   Date DME Agency Contacted: 01/10/20 Time DME Agency Contacted: [phone] Representative spoke with at DME Agency: Zack            Social Determinants of Health (SDOH) Interventions     Readmission Risk Interventions No flowsheet data found.
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
## 2020-01-10 NOTE — Plan of Care (Addendum)
  Problem: Safety: Goal: Ability to remain free from injury will improve Outcome: Progressing   Problem: Pain Managment: Goal: General experience of comfort will improve Outcome: Progressing   Problem: Coping: Goal: Level of anxiety will decrease Outcome: Progressing   Per day shift nurse, Possible D/C to home tomorrow. Awaiting delivery of  patients home equipment.
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
## 2020-01-19 ENCOUNTER — Encounter (HOSPITAL_COMMUNITY): Payer: Self-pay | Admitting: Orthopedic Surgery

## 2020-01-19 ENCOUNTER — Other Ambulatory Visit: Payer: Self-pay

## 2020-01-19 NOTE — Progress Notes (Signed)
Patient denies shortness of breath, fever, cough and chest pain.  PCP - NONE Cardiologist - denies  Chest x-ray - 01/07/20, 1 view EKG - 01/08/20 Stress Test - denies ECHO - denies Cardiac Cath - denies  Blood Thinner Instructions: Last dose of lovenox was on 01/16/20.  Anesthesia review: No  STOP now taking any Aspirin (unless otherwise instructed by your surgeon), Aleve, Naproxen, Ibuprofen, Motrin, Advil, Goody's, BC's, all herbal medications, fish oil, and all vitamins.   Coronavirus Screening Patient had positive covid on 12/23/19.    Do you currently have any of the following symptoms:  Cough yes/no: No Fever (>100.30F)  yes/no: No Runny nose yes/no: No Sore throat yes/no: No Difficulty breathing/shortness of breath  yes/no: No  Have you traveled in the last 14 days and where? yes/no: No  Patient verbalized understanding of instructions that were given via phone.

## 2020-01-20 ENCOUNTER — Ambulatory Visit (HOSPITAL_COMMUNITY): Payer: Medicaid Other

## 2020-01-20 ENCOUNTER — Ambulatory Visit (HOSPITAL_COMMUNITY): Payer: Medicaid Other | Admitting: Anesthesiology

## 2020-01-20 ENCOUNTER — Encounter (HOSPITAL_COMMUNITY)
Admission: RE | Disposition: A | Discharge status: Home / Self Care | Payer: Self-pay | Source: Home / Self Care | Attending: Orthopedic Surgery

## 2020-01-20 ENCOUNTER — Encounter (HOSPITAL_COMMUNITY): Payer: Self-pay | Admitting: Orthopedic Surgery

## 2020-01-20 ENCOUNTER — Ambulatory Visit (HOSPITAL_COMMUNITY)
Admission: RE | Admit: 2020-01-20 | Discharge: 2020-01-20 | Disposition: A | Discharge status: Home / Self Care | Payer: Medicaid Other | Attending: Orthopedic Surgery | Admitting: Orthopedic Surgery

## 2020-01-20 DIAGNOSIS — D62 Acute posthemorrhagic anemia: Secondary | ICD-10-CM | POA: Diagnosis not present

## 2020-01-20 DIAGNOSIS — S7291XA Unspecified fracture of right femur, initial encounter for closed fracture: Secondary | ICD-10-CM | POA: Diagnosis present

## 2020-01-20 DIAGNOSIS — F17200 Nicotine dependence, unspecified, uncomplicated: Secondary | ICD-10-CM | POA: Diagnosis not present

## 2020-01-20 DIAGNOSIS — U071 COVID-19: Secondary | ICD-10-CM | POA: Insufficient documentation

## 2020-01-20 DIAGNOSIS — Z419 Encounter for procedure for purposes other than remedying health state, unspecified: Secondary | ICD-10-CM

## 2020-01-20 DIAGNOSIS — S8252XA Displaced fracture of medial malleolus of left tibia, initial encounter for closed fracture: Secondary | ICD-10-CM | POA: Diagnosis not present

## 2020-01-20 DIAGNOSIS — T148XXA Other injury of unspecified body region, initial encounter: Secondary | ICD-10-CM

## 2020-01-20 HISTORY — PX: ORIF ANKLE FRACTURE: SHX5408

## 2020-01-20 HISTORY — DX: Anemia, unspecified: D64.9

## 2020-01-20 LAB — COMPREHENSIVE METABOLIC PANEL
ALT: 19 U/L (ref 0–44)
AST: 23 U/L (ref 15–41)
Albumin: 4 g/dL (ref 3.5–5.0)
Alkaline Phosphatase: 113 U/L (ref 38–126)
Anion gap: 11 (ref 5–15)
BUN: 9 mg/dL (ref 6–20)
CO2: 24 mmol/L (ref 22–32)
Calcium: 9.7 mg/dL (ref 8.9–10.3)
Chloride: 104 mmol/L (ref 98–111)
Creatinine, Ser: 0.62 mg/dL (ref 0.44–1.00)
GFR calc Af Amer: >60 mL/min (ref >60)
GFR calc non Af Amer: >60 mL/min (ref >60)
Glucose, Bld: 102 mg/dL — ABNORMAL HIGH (ref 70–99)
Potassium: 3.7 mmol/L (ref 3.5–5.1)
Sodium: 139 mmol/L (ref 135–145)
Total Bilirubin: 0.7 mg/dL (ref 0.3–1.2)
Total Protein: 7.4 g/dL (ref 6.5–8.1)

## 2020-01-20 LAB — HCG, SERUM, QUALITATIVE: Preg, Serum: NEGATIVE

## 2020-01-20 SURGERY — OPEN REDUCTION INTERNAL FIXATION (ORIF) ANKLE FRACTURE
Anesthesia: Regional | Site: Ankle | Laterality: Left

## 2020-01-20 MED ORDER — CEFAZOLIN SODIUM-DEXTROSE 2-4 GM/100ML-% IV SOLN
INTRAVENOUS | Status: AC
  Filled 2020-01-20: qty 100

## 2020-01-20 MED ORDER — ACETAMINOPHEN 500 MG PO TABS
1000.0000 mg | ORAL_TABLET | Freq: Once | ORAL | Status: AC
  Administered 2020-01-20: 1000 mg via ORAL
  Filled 2020-01-20: qty 2

## 2020-01-20 MED ORDER — OXYCODONE HCL 5 MG/5ML PO SOLN: 5.0000 mg | Freq: Once | ORAL | Status: DC | PRN

## 2020-01-20 MED ORDER — FENTANYL CITRATE (PF) 250 MCG/5ML IJ SOLN
INTRAMUSCULAR | Status: DC | PRN
  Administered 2020-01-20 (×2): 50 ug via INTRAVENOUS

## 2020-01-20 MED ORDER — PROPOFOL 10 MG/ML IV BOLUS
INTRAVENOUS | Status: AC
  Filled 2020-01-20: qty 40

## 2020-01-20 MED ORDER — DEXAMETHASONE SODIUM PHOSPHATE 10 MG/ML IJ SOLN
INTRAMUSCULAR | Status: DC | PRN
  Administered 2020-01-20: 8 mg via INTRAVENOUS

## 2020-01-20 MED ORDER — FENTANYL CITRATE (PF) 100 MCG/2ML IJ SOLN
INTRAMUSCULAR | Status: AC
  Administered 2020-01-20: 100 ug via INTRAVENOUS
  Filled 2020-01-20: qty 2

## 2020-01-20 MED ORDER — CHLORHEXIDINE GLUCONATE 4 % EX LIQD: 60.0000 mL | Freq: Once | CUTANEOUS | Status: DC

## 2020-01-20 MED ORDER — SUGAMMADEX SODIUM 200 MG/2ML IV SOLN
INTRAVENOUS | Status: DC | PRN
  Administered 2020-01-20: 200 mg via INTRAVENOUS

## 2020-01-20 MED ORDER — ONDANSETRON 8 MG PO TBDP: 8.0000 mg | ORAL_TABLET | Freq: Three times a day (TID) | ORAL | 0 refills | Status: DC | PRN

## 2020-01-20 MED ORDER — OXYCODONE HCL 5 MG PO TABS: 5.0000 mg | ORAL_TABLET | Freq: Once | ORAL | Status: DC | PRN

## 2020-01-20 MED ORDER — ONDANSETRON HCL 4 MG/2ML IJ SOLN
INTRAMUSCULAR | Status: AC
  Filled 2020-01-20: qty 2

## 2020-01-20 MED ORDER — DEXAMETHASONE SODIUM PHOSPHATE 10 MG/ML IJ SOLN
INTRAMUSCULAR | Status: DC | PRN
  Administered 2020-01-20: 10 mg

## 2020-01-20 MED ORDER — MIDAZOLAM HCL 2 MG/2ML IJ SOLN
INTRAMUSCULAR | Status: AC
  Filled 2020-01-20: qty 2

## 2020-01-20 MED ORDER — DEXAMETHASONE SODIUM PHOSPHATE 10 MG/ML IJ SOLN
INTRAMUSCULAR | Status: AC
  Filled 2020-01-20: qty 1

## 2020-01-20 MED ORDER — FENTANYL CITRATE (PF) 250 MCG/5ML IJ SOLN
INTRAMUSCULAR | Status: AC
  Filled 2020-01-20: qty 5

## 2020-01-20 MED ORDER — LACTATED RINGERS IV SOLN: INTRAVENOUS | Status: DC

## 2020-01-20 MED ORDER — PROMETHAZINE HCL 25 MG/ML IJ SOLN: 6.2500 mg | INTRAMUSCULAR | Status: DC | PRN

## 2020-01-20 MED ORDER — PROPOFOL 10 MG/ML IV BOLUS
INTRAVENOUS | Status: DC | PRN
  Administered 2020-01-20: 150 mg via INTRAVENOUS

## 2020-01-20 MED ORDER — LIDOCAINE 2% (20 MG/ML) 5 ML SYRINGE
INTRAMUSCULAR | Status: DC | PRN
  Administered 2020-01-20: 20 mg via INTRAVENOUS

## 2020-01-20 MED ORDER — SCOPOLAMINE 1 MG/3DAYS TD PT72
1.0000 | MEDICATED_PATCH | TRANSDERMAL | Status: DC
  Administered 2020-01-20: 1.5 mg via TRANSDERMAL
  Filled 2020-01-20: qty 1

## 2020-01-20 MED ORDER — PHENYLEPHRINE HCL (PRESSORS) 10 MG/ML IV SOLN
INTRAVENOUS | Status: DC | PRN
  Administered 2020-01-20: 40 ug via INTRAVENOUS

## 2020-01-20 MED ORDER — MIDAZOLAM HCL 5 MG/5ML IJ SOLN
INTRAMUSCULAR | Status: DC | PRN
  Administered 2020-01-20: 2 mg via INTRAVENOUS

## 2020-01-20 MED ORDER — KETOROLAC TROMETHAMINE 30 MG/ML IJ SOLN: 30.0000 mg | Freq: Once | INTRAMUSCULAR | Status: DC | PRN

## 2020-01-20 MED ORDER — POVIDONE-IODINE 10 % EX SWAB: 2.0000 "application " | Freq: Once | CUTANEOUS | Status: DC

## 2020-01-20 MED ORDER — ONDANSETRON HCL 4 MG/2ML IJ SOLN
INTRAMUSCULAR | Status: DC | PRN
  Administered 2020-01-20: 4 mg via INTRAVENOUS

## 2020-01-20 MED ORDER — 0.9 % SODIUM CHLORIDE (POUR BTL) OPTIME
TOPICAL | Status: DC | PRN
  Administered 2020-01-20: 1000 mL

## 2020-01-20 MED ORDER — ASPIRIN EC 325 MG PO TBEC: 325.0000 mg | DELAYED_RELEASE_TABLET | Freq: Every day | ORAL | 0 refills | Status: DC

## 2020-01-20 MED ORDER — METHOCARBAMOL 500 MG PO TABS: 500.0000 mg | ORAL_TABLET | Freq: Three times a day (TID) | ORAL | 0 refills | Status: DC | PRN

## 2020-01-20 MED ORDER — MIDAZOLAM HCL 2 MG/2ML IJ SOLN
2.0000 mg | Freq: Once | INTRAMUSCULAR | Status: AC
  Administered 2020-01-20: 2 mg via INTRAVENOUS

## 2020-01-20 MED ORDER — ROCURONIUM BROMIDE 10 MG/ML (PF) SYRINGE
PREFILLED_SYRINGE | INTRAVENOUS | Status: DC | PRN
  Administered 2020-01-20: 50 mg via INTRAVENOUS

## 2020-01-20 MED ORDER — CEFAZOLIN SODIUM-DEXTROSE 2-4 GM/100ML-% IV SOLN
2.0000 g | INTRAVENOUS | Status: AC
  Administered 2020-01-20: 2 g via INTRAVENOUS

## 2020-01-20 MED ORDER — FENTANYL CITRATE (PF) 100 MCG/2ML IJ SOLN: 100.0000 ug | Freq: Once | INTRAMUSCULAR | Status: AC

## 2020-01-20 MED ORDER — MEPERIDINE HCL 25 MG/ML IJ SOLN: 6.2500 mg | INTRAMUSCULAR | Status: DC | PRN

## 2020-01-20 MED ORDER — ROPIVACAINE HCL 5 MG/ML IJ SOLN
INTRAMUSCULAR | Status: DC | PRN
  Administered 2020-01-20: 40 mL via PERINEURAL

## 2020-01-20 MED ORDER — HYDROMORPHONE HCL 1 MG/ML IJ SOLN: 0.2500 mg | INTRAMUSCULAR | Status: DC | PRN

## 2020-01-20 SURGICAL SUPPLY — 65 items
BANDAGE ESMARK 6X9 LF (GAUZE/BANDAGES/DRESSINGS) IMPLANT
BIT DRILL 2.4 AO COUPLING CANN (BIT) ×3 IMPLANT
BNDG ELASTIC 3X5.8 VLCR STR LF (GAUZE/BANDAGES/DRESSINGS) ×3 IMPLANT
BNDG ELASTIC 4X5.8 VLCR STR LF (GAUZE/BANDAGES/DRESSINGS) ×3 IMPLANT
BNDG ELASTIC 6X5.8 VLCR STR LF (GAUZE/BANDAGES/DRESSINGS) IMPLANT
BNDG ESMARK 6X9 LF (GAUZE/BANDAGES/DRESSINGS)
BNDG GAUZE ELAST 4 BULKY (GAUZE/BANDAGES/DRESSINGS) ×6 IMPLANT
BRUSH SCRUB EZ PLAIN DRY (MISCELLANEOUS) ×6 IMPLANT
COVER MAYO STAND STRL (DRAPES) ×3 IMPLANT
COVER SURGICAL LIGHT HANDLE (MISCELLANEOUS) ×3 IMPLANT
COVER WAND RF STERILE (DRAPES) ×3 IMPLANT
CUFF TOURN SGL QUICK 34 (TOURNIQUET CUFF) ×2
CUFF TRNQT CYL 34X4.125X (TOURNIQUET CUFF) ×1 IMPLANT
DRAPE C-ARM 42X72 X-RAY (DRAPES) ×3 IMPLANT
DRAPE C-ARMOR (DRAPES) ×3 IMPLANT
DRAPE HALF SHEET 40X57 (DRAPES) ×3 IMPLANT
DRAPE U-SHAPE 47X51 STRL (DRAPES) ×3 IMPLANT
DRSG EMULSION OIL 3X3 NADH (GAUZE/BANDAGES/DRESSINGS) ×3 IMPLANT
DRSG MEPITEL 4X7.2 (GAUZE/BANDAGES/DRESSINGS) ×3 IMPLANT
ELECT REM PT RETURN 9FT ADLT (ELECTROSURGICAL) ×3
ELECTRODE REM PT RTRN 9FT ADLT (ELECTROSURGICAL) ×1 IMPLANT
GAUZE SPONGE 4X4 12PLY STRL (GAUZE/BANDAGES/DRESSINGS) ×6 IMPLANT
GAUZE SPONGE 4X4 12PLY STRL LF (GAUZE/BANDAGES/DRESSINGS) ×3 IMPLANT
GLOVE BIO SURGEON STRL SZ7.5 (GLOVE) ×3 IMPLANT
GLOVE BIO SURGEON STRL SZ8 (GLOVE) IMPLANT
GLOVE BIOGEL PI IND STRL 7.5 (GLOVE) IMPLANT
GLOVE BIOGEL PI IND STRL 8 (GLOVE) ×1 IMPLANT
GLOVE BIOGEL PI INDICATOR 7.5 (GLOVE)
GLOVE BIOGEL PI INDICATOR 8 (GLOVE) ×2
GOWN STRL REUS W/ TWL LRG LVL3 (GOWN DISPOSABLE) ×2 IMPLANT
GOWN STRL REUS W/ TWL XL LVL3 (GOWN DISPOSABLE) ×1 IMPLANT
GOWN STRL REUS W/TWL LRG LVL3 (GOWN DISPOSABLE) ×4
GOWN STRL REUS W/TWL XL LVL3 (GOWN DISPOSABLE) ×2
K-WIRE 1.8 (WIRE) ×2
K-WIRE FX200X1.8XTROC TIP (WIRE) ×1
KIT BASIN OR (CUSTOM PROCEDURE TRAY) ×3 IMPLANT
KIT TURNOVER KIT B (KITS) ×3 IMPLANT
KWIRE FX200X1.8XTROC TIP (WIRE) ×1 IMPLANT
MANIFOLD NEPTUNE II (INSTRUMENTS) IMPLANT
NEEDLE HYPO 21X1.5 SAFETY (NEEDLE) IMPLANT
NS IRRIG 1000ML POUR BTL (IV SOLUTION) ×3 IMPLANT
PACK GENERAL/GYN (CUSTOM PROCEDURE TRAY) ×3 IMPLANT
PACK ORTHO EXTREMITY (CUSTOM PROCEDURE TRAY) ×3 IMPLANT
PAD ABD 8X10 STRL (GAUZE/BANDAGES/DRESSINGS) ×6 IMPLANT
PAD ARMBOARD 7.5X6 YLW CONV (MISCELLANEOUS) ×6 IMPLANT
PAD CAST 3X4 CTTN HI CHSV (CAST SUPPLIES) ×1 IMPLANT
PAD CAST 4YDX4 CTTN HI CHSV (CAST SUPPLIES) ×1 IMPLANT
PADDING CAST COTTON 3X4 STRL (CAST SUPPLIES) ×2
PADDING CAST COTTON 4X4 STRL (CAST SUPPLIES) ×2
PADDING CAST COTTON 6X4 STRL (CAST SUPPLIES) IMPLANT
SCREW CANN PT 4.0X28 (Screw) ×6 IMPLANT
SPONGE LAP 18X18 RF (DISPOSABLE) ×3 IMPLANT
STAPLER VISISTAT 35W (STAPLE) IMPLANT
SUCTION FRAZIER HANDLE 10FR (MISCELLANEOUS) ×2
SUCTION TUBE FRAZIER 10FR DISP (MISCELLANEOUS) ×1 IMPLANT
SUT ETHILON 3 0 PS 1 (SUTURE) ×6 IMPLANT
SUT PDS AB 2-0 CT1 27 (SUTURE) IMPLANT
SUT VIC AB 2-0 CT1 27 (SUTURE) ×4
SUT VIC AB 2-0 CT1 TAPERPNT 27 (SUTURE) ×2 IMPLANT
TOWEL GREEN STERILE (TOWEL DISPOSABLE) ×6 IMPLANT
TOWEL GREEN STERILE FF (TOWEL DISPOSABLE) ×3 IMPLANT
TUBE CONNECTING 12'X1/4 (SUCTIONS) ×1
TUBE CONNECTING 12X1/4 (SUCTIONS) ×2 IMPLANT
UNDERPAD 30X30 (UNDERPADS AND DIAPERS) ×3 IMPLANT
WATER STERILE IRR 1000ML POUR (IV SOLUTION) ×3 IMPLANT
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
## 2020-01-20 NOTE — Anesthesia Procedure Notes (Signed)
Anesthesia Regional Block: Adductor canal block   Pre-Anesthetic Checklist: ,, timeout performed, Correct Patient, Correct Site, Correct Laterality, Correct Procedure, Correct Position, site marked, Risks and benefits discussed,  Surgical consent,  Pre-op evaluation,  At surgeon's request and post-op pain management  Laterality: Left  Prep: Maximum Sterile Barrier Precautions used, chloraprep       Needles:  Injection technique: Single-shot  Needle Type: Echogenic Stimulator Needle     Needle Length: 9cm  Needle Gauge: 22     Additional Needles:   Procedures:,,,, ultrasound used (permanent image in chart),,,,  Narrative:  Start time: 01/20/2020 8:40 AM End time: 01/20/2020 8:45 AM Injection made incrementally with aspirations every 5 mL.  Performed by: Personally  Anesthesiologist: Lannie Fields, DO  Additional Notes: Monitors applied. No increased pain on injection. No increased resistance to injection. Injection made in 5cc increments. Good needle visualization. Patient tolerated procedure well.

## 2020-01-20 NOTE — Anesthesia Postprocedure Evaluation (Signed)
Anesthesia Post Note  Patient: Chauna Osoria Harn  Procedure(s) Performed: OPEN REDUCTION INTERNAL FIXATION (ORIF) ANKLE FRACTURE (Left Ankle)     Patient location during evaluation: PACU Anesthesia Type: Regional and General Level of consciousness: awake and alert, oriented and patient cooperative Pain management: pain level controlled Vital Signs Assessment: post-procedure vital signs reviewed and stable Respiratory status: spontaneous breathing, nonlabored ventilation and respiratory function stable Cardiovascular status: blood pressure returned to baseline and stable Postop Assessment: no apparent nausea or vomiting Anesthetic complications: no    Last Vitals:  Vitals:   01/20/20 1203 01/20/20 1206  BP: 111/74   Pulse: 91 80  Resp: 16 20  Temp:    SpO2: 100% 100%    Last Pain:  Vitals:   01/20/20 1200  TempSrc:   PainSc: 0-No pain                 Tennis Must Kirstie Larsen

## 2020-01-20 NOTE — Anesthesia Procedure Notes (Signed)
Anesthesia Regional Block: Popliteal block   Pre-Anesthetic Checklist: ,, timeout performed, Correct Patient, Correct Site, Correct Laterality, Correct Procedure, Correct Position, site marked, Risks and benefits discussed,  Surgical consent,  Pre-op evaluation,  At surgeon's request and post-op pain management  Laterality: Left  Prep: Maximum Sterile Barrier Precautions used, chloraprep       Needles:  Injection technique: Single-shot  Needle Type: Echogenic Stimulator Needle     Needle Length: 9cm  Needle Gauge: 22     Additional Needles:   Procedures:,,,, ultrasound used (permanent image in chart),,,,  Narrative:  Start time: 01/20/2020 8:45 AM End time: 01/20/2020 8:50 AM Injection made incrementally with aspirations every 5 mL.  Performed by: Personally  Anesthesiologist: Lannie Fields, DO  Additional Notes: Monitors applied. No increased pain on injection. No increased resistance to injection. Injection made in 5cc increments. Good needle visualization. Patient tolerated procedure well.

## 2020-01-20 NOTE — Progress Notes (Signed)
Pt completed dose of mupirocin per protocol within the past 30 days. Profend not given DOS due to allergy to shellfish.

## 2020-01-20 NOTE — Anesthesia Procedure Notes (Signed)
Procedure Name: Intubation Date/Time: 01/20/2020 10:24 AM Performed by: Mariea Clonts, CRNA Pre-anesthesia Checklist: Patient identified, Emergency Drugs available, Suction available and Patient being monitored Patient Re-evaluated:Patient Re-evaluated prior to induction Oxygen Delivery Method: Circle System Utilized Preoxygenation: Pre-oxygenation with 100% oxygen Induction Type: IV induction Ventilation: Mask ventilation without difficulty Laryngoscope Size: Mac and 3 Grade View: Grade I Tube type: Oral Tube size: 7.0 mm Number of attempts: 1 Airway Equipment and Method: Stylet and Oral airway Placement Confirmation: ETT inserted through vocal cords under direct vision,  positive ETCO2 and breath sounds checked- equal and bilateral Tube secured with: Tape Dental Injury: Teeth and Oropharynx as per pre-operative assessment

## 2020-01-20 NOTE — Transfer of Care (Signed)
Immediate Anesthesia Transfer of Care Note  Patient: Audrey Schultz  Procedure(s) Performed: OPEN REDUCTION INTERNAL FIXATION (ORIF) ANKLE FRACTURE (Left Ankle)  Patient Location: PACU  Anesthesia Type:General and Regional  Level of Consciousness: awake and patient cooperative  Airway & Oxygen Therapy: Patient Spontanous Breathing and Patient connected to face mask oxygen  Post-op Assessment: Report given to RN and Post -op Vital signs reviewed and stable  Post vital signs: Reviewed and stable   Last Vitals:  Vitals Value Taken Time  BP    Temp    Pulse    Resp    SpO2      Last Pain:  Vitals:   01/20/20 0727  TempSrc:   PainSc: 8       Patients Stated Pain Goal: 3 (01/20/20 0727)  Complications: No apparent anesthesia complications

## 2020-01-20 NOTE — Anesthesia Preprocedure Evaluation (Signed)
Anesthesia Evaluation  Patient identified by MRN, date of birth, ID band Patient awake    Reviewed: Allergy & Precautions, NPO status , Patient's Chart, lab work & pertinent test results  Airway Mallampati: I  TM Distance: >3 FB Neck ROM: Full    Dental no notable dental hx. (+) Teeth Intact, Dental Advisory Given   Pulmonary Current Smoker and Patient abstained from smoking.,    Pulmonary exam normal breath sounds clear to auscultation       Cardiovascular negative cardio ROS Normal cardiovascular exam Rhythm:Regular Rate:Normal     Neuro/Psych PSYCHIATRIC DISORDERS Depression negative neurological ROS     GI/Hepatic negative GI ROS, (+)     substance abuse  alcohol use and marijuana use,   Endo/Other  negative endocrine ROS  Renal/GU negative Renal ROS  negative genitourinary   Musculoskeletal negative musculoskeletal ROS (+)   Abdominal Normal abdominal exam  (+)   Peds  Hematology  (+) anemia , hct 29.4   Anesthesia Other Findings Left medial malleolus fx  Reproductive/Obstetrics negative OB ROS                             Anesthesia Physical  Anesthesia Plan  ASA: II  Anesthesia Plan: General and Regional   Post-op Pain Management:    Induction: Intravenous  PONV Risk Score and Plan: 4 or greater and Ondansetron, Dexamethasone, Midazolam and Treatment may vary due to age or medical condition  Airway Management Planned: Oral ETT  Additional Equipment: None  Intra-op Plan:   Post-operative Plan: Extubation in OR  Informed Consent: I have reviewed the patients History and Physical, chart, labs and discussed the procedure including the risks, benefits and alternatives for the proposed anesthesia with the patient or authorized representative who has indicated his/her understanding and acceptance.     Dental advisory given  Plan Discussed with: CRNA  Anesthesia Plan  Comments:         Anesthesia Quick Evaluation

## 2020-01-20 NOTE — Interval H&P Note (Signed)
History and Physical Interval Note:  01/20/2020 9:14 AM  Audrey Schultz  has presented today for surgery, with the diagnosis of LEFT MEDIAL MALIOUS FRACTURE.  The various methods of treatment have been discussed with the patient and family. After consideration of risks, benefits and other options for treatment, the patient has consented to  Procedure(s): OPEN REDUCTION INTERNAL FIXATION (ORIF) ANKLE FRACTURE (Left) as a surgical intervention.  The patient's history has been reviewed, patient examined, no change in status, stable for surgery.  I have reviewed the patient's chart and labs.  Questions were answered to the patient's satisfaction.     Patient seen and evaluated in the office on 01/19/2020 for first post op follow up.  At that time she noted some L ankle pain that started about 1 week ago.  Xrays performed and show a mildly displaced L medial malleolus fracture.  She has been WBAT B LEx since surgery on her R femur about 2 weeks ago.  Fortunately the L medial mall fracture has not displaced too much. Suspect it was nondisplaced at the time of injury as she never complained of ankle pain during her hospital course and did a fair amount of ambulation with therapy prior to dc.  Discussed multiple treatment options with the patient and we recommend ORIF. Pt is in agreement.  Outpt procedure.  L LEx     No ankle effusion     Tender directly over medial mall     No fibular tenderness    No knee tenderness    Ext warm     No significant swelling     Motor and sensory functions grossly intact    +DP pulse   OR today for ORIF L medial mall Will likely let WBAT in CAM  Outpt procedure    Audrey Latin, PA-C [phone] (C) 01/20/2020, 9:19 AM  [facility] [address] [place] [address] [phone] Collier Bullock (F)

## 2020-01-20 NOTE — Progress Notes (Signed)
Orthopedic Tech Progress Note Patient Details:  Audrey Schultz [birth year] [phone] OR RN called requesting a CAM WALKING BOOT for patient. Dropped off at OR desk [employer] Type of Ortho Device: CAM walker   Post Interventions Patient Tolerated: Other (comment) Instructions Provided: Other (comment)   Donald Pore 01/20/2020, 11:28 AM

## 2020-01-20 NOTE — Progress Notes (Signed)
I have seen and examined the patient. I agree with Mr. Ollen Gross interval H&P findings above.  I discussed with the patient the risks and benefits of surgery for her displaced left medial malleolus fracture, including the possibility of infection, nerve injury, vessel injury, wound breakdown, arthritis, symptomatic hardware, DVT/ PE, loss of motion, malunion, nonunion, and need for further surgery among others. She acknowledged these risks and wished to proceed.  Budd Palmer, MD 01/20/2020 9:53 AM

## 2020-01-24 ENCOUNTER — Encounter: Payer: Self-pay | Admitting: *Deleted

## 2020-02-04 NOTE — Anesthesia Postprocedure Evaluation (Signed)
Anesthesia Post Note  Patient: Audrey Schultz  Procedure(s) Performed: INTRAMEDULLARY (IM) NAIL INTERTROCHANTRIC (Right )     Patient location during evaluation: PACU Anesthesia Type: General Level of consciousness: sedated Pain management: pain level controlled Vital Signs Assessment: post-procedure vital signs reviewed and stable Respiratory status: spontaneous breathing Cardiovascular status: stable Postop Assessment: no apparent nausea or vomiting Anesthetic complications: no    Last Vitals:  Vitals:   01/10/20 0726 01/10/20 1237  BP: (!) 139/96 110/71  Pulse: 84 78  Resp: 16 17  Temp: 37.3 C 37.1 C  SpO2: 100% 100%    Last Pain:  Vitals:   01/10/20 1237  TempSrc: Oral  PainSc:    Pain Goal: Patients Stated Pain Goal: 3 (01/10/20 0906)                 Caren Macadam

## 2020-10-27 ENCOUNTER — Encounter (HOSPITAL_COMMUNITY): Payer: Self-pay | Admitting: Emergency Medicine

## 2020-10-27 ENCOUNTER — Other Ambulatory Visit: Payer: Self-pay

## 2020-10-27 ENCOUNTER — Emergency Department (HOSPITAL_COMMUNITY)
Admission: EM | Admit: 2020-10-27 | Discharge: 2020-10-28 | Disposition: A | Discharge status: Home / Self Care | Payer: Medicaid Other | Attending: Emergency Medicine | Admitting: Emergency Medicine

## 2020-10-27 DIAGNOSIS — R519 Headache, unspecified: Secondary | ICD-10-CM | POA: Diagnosis present

## 2020-10-27 DIAGNOSIS — R1110 Vomiting, unspecified: Secondary | ICD-10-CM | POA: Diagnosis not present

## 2020-10-27 DIAGNOSIS — Z7982 Long term (current) use of aspirin: Secondary | ICD-10-CM | POA: Insufficient documentation

## 2020-10-27 DIAGNOSIS — F17210 Nicotine dependence, cigarettes, uncomplicated: Secondary | ICD-10-CM | POA: Diagnosis not present

## 2020-10-27 DIAGNOSIS — G43009 Migraine without aura, not intractable, without status migrainosus: Secondary | ICD-10-CM | POA: Insufficient documentation

## 2020-10-27 LAB — COMPREHENSIVE METABOLIC PANEL WITH GFR
ALT: 14 U/L (ref 0–44)
AST: 23 U/L (ref 15–41)
Albumin: 4.3 g/dL (ref 3.5–5.0)
Alkaline Phosphatase: 72 U/L (ref 38–126)
Anion gap: 12 (ref 5–15)
BUN: 8 mg/dL (ref 6–20)
CO2: 26 mmol/L (ref 22–32)
Calcium: 9.7 mg/dL (ref 8.9–10.3)
Chloride: 108 mmol/L (ref 98–111)
Creatinine, Ser: 0.91 mg/dL (ref 0.44–1.00)
GFR, Estimated: >60 mL/min
Glucose, Bld: 104 mg/dL — ABNORMAL HIGH (ref 70–99)
Potassium: 3.4 mmol/L — ABNORMAL LOW (ref 3.5–5.1)
Sodium: 146 mmol/L — ABNORMAL HIGH (ref 135–145)
Total Bilirubin: 0.3 mg/dL (ref 0.3–1.2)
Total Protein: 7.4 g/dL (ref 6.5–8.1)

## 2020-10-27 LAB — CBC
HCT: 41.5 % (ref 36.0–46.0)
Hemoglobin: 13.1 g/dL (ref 12.0–15.0)
MCH: 29.9 pg (ref 26.0–34.0)
MCHC: 31.6 g/dL (ref 30.0–36.0)
MCV: 94.7 fL (ref 80.0–100.0)
Platelets: 355 10*3/uL (ref 150–400)
RBC: 4.38 MIL/uL (ref 3.87–5.11)
RDW: 11.8 % (ref 11.5–15.5)
WBC: 11 10*3/uL — ABNORMAL HIGH (ref 4.0–10.5)
nRBC: 0 % (ref 0.0–0.2)

## 2020-10-27 LAB — I-STAT BETA HCG BLOOD, ED (MC, WL, AP ONLY): I-stat hCG, quantitative: <5 m[IU]/mL (ref <5)

## 2020-10-27 LAB — LIPASE, BLOOD: Lipase: 29 U/L (ref 11–51)

## 2020-10-27 MED ORDER — DIPHENHYDRAMINE HCL 50 MG/ML IJ SOLN
25.0000 mg | Freq: Once | INTRAMUSCULAR | Status: AC
  Administered 2020-10-27: 25 mg via INTRAVENOUS
  Filled 2020-10-27: qty 1

## 2020-10-27 MED ORDER — SODIUM CHLORIDE 0.9 % IV BOLUS (SEPSIS)
1000.0000 mL | Freq: Once | INTRAVENOUS | Status: AC
  Administered 2020-10-27: 1000 mL via INTRAVENOUS

## 2020-10-27 MED ORDER — METOCLOPRAMIDE HCL 5 MG/ML IJ SOLN
10.0000 mg | Freq: Once | INTRAMUSCULAR | Status: AC
  Administered 2020-10-27: 10 mg via INTRAVENOUS
  Filled 2020-10-27: qty 2

## 2020-10-27 MED ORDER — KETOROLAC TROMETHAMINE 30 MG/ML IJ SOLN
30.0000 mg | Freq: Once | INTRAMUSCULAR | Status: AC
  Administered 2020-10-27: 30 mg via INTRAVENOUS
  Filled 2020-10-27: qty 1

## 2020-10-27 NOTE — ED Triage Notes (Signed)
Pt to ED with c/o severe headache, onset about 3pm today.  Pt st's she started vomiting a couple hours later.  Pt c/o of her eye hurting.  Denies any other complaints

## 2020-10-27 NOTE — ED Provider Notes (Signed)
TIME SEEN: 11:12 PM  CHIEF COMPLAINT: Headache, vomiting  HPI: Patient is a 29 year old female who presents to the emergency department with diffuse throbbing headache, vomiting that started this afternoon.  No head injury.  Not on blood thinners.  No fever, neck pain or neck stiffness.  No numbness or weakness.  Has been seen in the emergency department before in October 2020 for bad headache.  No diagnosis of migraines per her report.  No sick contacts.  Unvaccinated for COVID-19.  Patient has light sensitivity.  Denies abdominal pain.  No diarrhea.  ROS: See HPI Constitutional: no fever  Eyes: no drainage  ENT: no runny nose   Cardiovascular:  no chest pain  Resp: no SOB  GI: no vomiting GU: no dysuria Integumentary: no rash  Allergy: no hives  Musculoskeletal: no leg swelling  Neurological: no slurred speech ROS otherwise negative  PAST MEDICAL HISTORY/PAST SURGICAL HISTORY:  Past Medical History:  Diagnosis Date  . Anemia    hx w/pregnancy  . Dysmenorrhea   . Medical history non-contributory   . Nicotine dependence 01/10/2020  . Vitamin D deficiency 01/10/2020    MEDICATIONS:  Prior to Admission medications   Medication Sig Start Date End Date Taking? Authorizing Provider  acetaminophen (TYLENOL) 325 MG tablet Take 2 tablets (650 mg total) by mouth every 12 (twelve) hours. 01/10/20   Montez Morita, PA-C  aspirin EC 325 MG tablet Take 1 tablet (325 mg total) by mouth daily. 01/20/20   Montez Morita, PA-C  methocarbamol (ROBAXIN) 500 MG tablet Take 1 tablet (500 mg total) by mouth every 8 (eight) hours as needed for muscle spasms. 01/20/20   Montez Morita, PA-C  ondansetron (ZOFRAN ODT) 8 MG disintegrating tablet Take 1 tablet (8 mg total) by mouth every 8 (eight) hours as needed for nausea or vomiting. 01/20/20   Montez Morita, PA-C  traMADol (ULTRAM) 50 MG tablet Take 50-100 mg by mouth every 6 (six) hours as needed for pain. 01/19/20   [provider]    ALLERGIES:  Allergies   Allergen Reactions  . Shellfish Allergy Anaphylaxis    SOCIAL HISTORY:  Social History   Tobacco Use  . Smoking status: Current Every Day Smoker    Packs/day: 0.25    Years: 11.00    Pack years: 2.75    Types: Cigarettes  . Smokeless tobacco: Never Used  Substance Use Topics  . Alcohol use: Yes    Alcohol/week: 4.0 - 5.0 standard drinks    Types: 4 - 5 Standard drinks or equivalent per week    FAMILY HISTORY: Family History  Problem Relation Age of Onset  . Hypertension Mother   . Diabetes Mother     EXAM: BP 109/70 (BP Location: Left Arm)   Pulse 83   Temp 97.9 F (36.6 C) (Oral)   Resp 18   Ht 5' 3.5" (1.613 m)   Wt 54.4 kg   LMP 10/20/2020 (Approximate)   SpO2 100%   BMI 20.92 kg/m  CONSTITUTIONAL: Alert and oriented and responds appropriately to questions. Well-appearing; well-nourished HEAD: Normocephalic EYES: Conjunctivae clear, pupils appear equal, EOM appear intact; + photophobia ENT: normal nose; moist mucous membranes NECK: Supple, normal ROM CARD: RRR; S1 and S2 appreciated; no murmurs, no clicks, no rubs, no gallops RESP: Normal chest excursion without splinting or tachypnea; breath sounds clear and equal bilaterally; no wheezes, no rhonchi, no rales, no hypoxia or respiratory distress, speaking full sentences ABD/GI: Normal bowel sounds; non-distended; soft, non-tender, no rebound, no guarding, no  peritoneal signs, no hepatosplenomegaly BACK:  The back appears normal EXT: Normal ROM in all joints; no deformity noted, no edema; no cyanosis SKIN: Normal color for age and race; warm; no rash on exposed skin NEURO: Moves all extremities equally, normal sensation diffusely, cranial nerves II through XII intact, normal speech PSYCH: The patient's mood and manner are appropriate.   MEDICAL DECISION MAKING: Patient here with likely migraine headache.  Labs obtained in triage are unremarkable.  Abdominal exam benign.  Will give migraine cocktail and  reassess.  Doubt intracranial hemorrhage, stroke, CVT, meningitis.  ED PROGRESS: Patient's headache is now most completely resolved.  Will discharge home.  Given outpatient follow-up.  At this time, I do not feel there is any life-threatening condition present. I have reviewed, interpreted and discussed all results (EKG, imaging, lab, urine as appropriate) and exam findings with patient/family. I have reviewed nursing notes and appropriate previous records.  I feel the patient is safe to be discharged home without further emergent workup and can continue workup as an outpatient as needed. Discussed usual and customary return precautions. Patient/family verbalize understanding and are comfortable with this plan.  Outpatient follow-up has been provided as needed. All questions have been answered.       Audrey Schultz was evaluated in Emergency Department on 10/27/2020 for the symptoms described in the history of present illness. She was evaluated in the context of the global COVID-19 pandemic, which necessitated consideration that the patient might be at risk for infection with the SARS-CoV-2 virus that causes COVID-19. Institutional protocols and algorithms that pertain to the evaluation of patients at risk for COVID-19 are in a state of rapid change based on information released by regulatory bodies including the CDC and federal and state organizations. These policies and algorithms were followed during the patient's care in the ED.      Iria Jamerson, Layla Maw, DO 10/28/20 [phone]

## 2020-10-28 NOTE — ED Notes (Signed)
Pt states that she is feeling better after pain and nausea medication given

## 2020-10-28 NOTE — Discharge Instructions (Addendum)
Steps to find a Primary Care Provider (PCP):  Call [phone] or [phone] to access "[facility] Find a Doctor Service."  2.  You may also go on the [facility] website at [email or web address]  3.  [facility] also frequently accepts new patients.  [facility]  [address] [place] [address] [phone]  4.  There are also multiple [facility], [facility] and [facility] practices throughout the [place] that are frequently accepting new patients. You may find a clinic that is close to your home and contact them.  [email or web address] [phone]  [facility] [email or web address]  [email or web address] [phone]  [facility] [email or web address] [phone]  5.  Local Health Departments also can provide primary care services.  [facility]  [address] [place], [place] [address] [phone]  [facility] Department [address] [place] [address] [phone]  [facility] [address] [place] [address]  [place] [address] [phone]

## 2021-04-16 IMAGING — DX DG FEMUR 2+V PORT*R*
4 series · 4 of 4 positions shown · non-contrast
Comparison: 01/07/2020

CLINICAL DATA: Pain

EXAM:
RIGHT FEMUR PORTABLE 2 VIEW

[femur lat (1 of 2)]
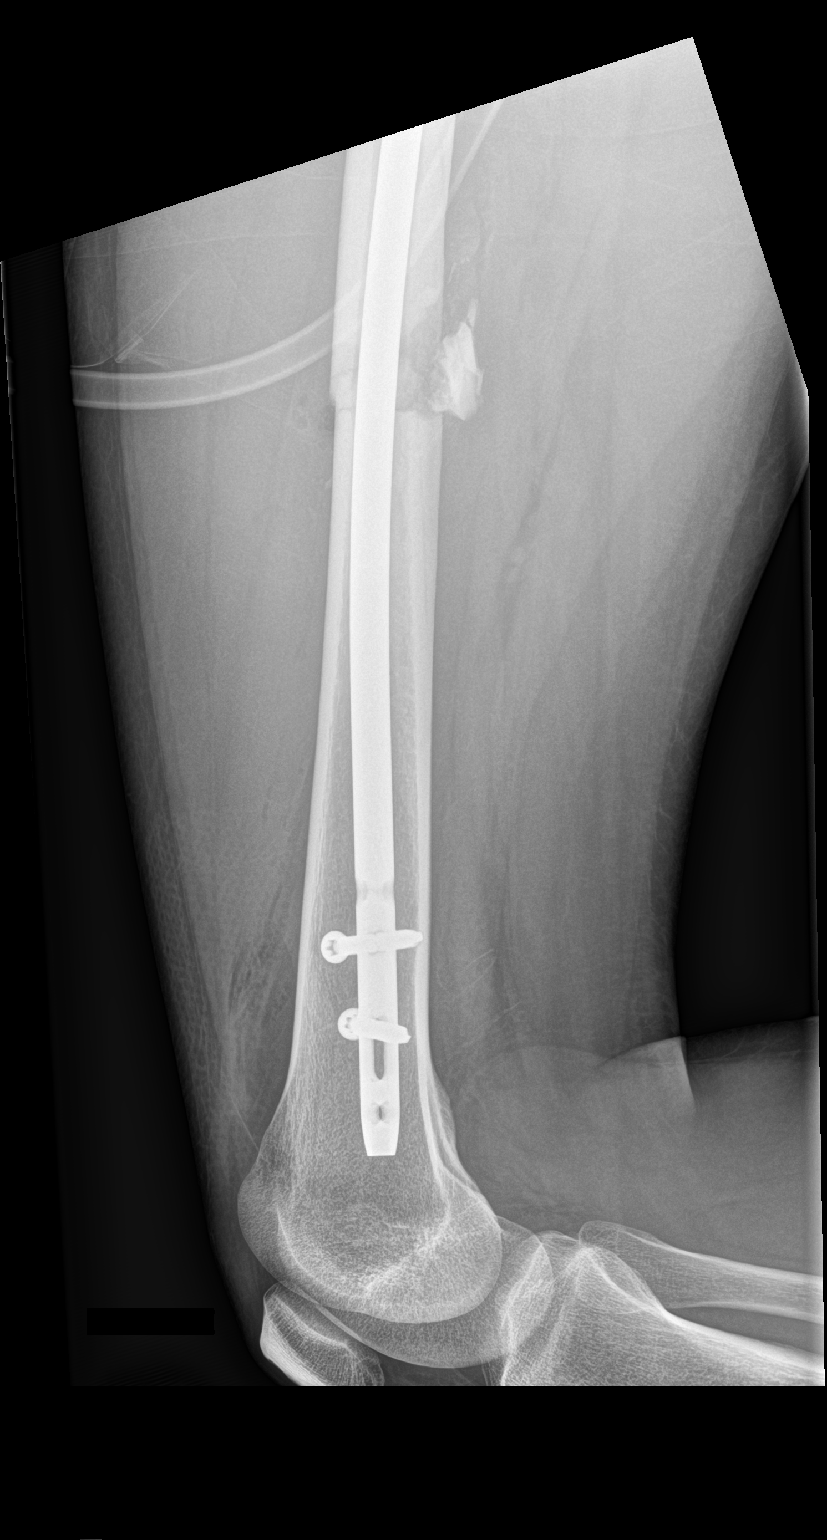

[femur ap (1 of 2)]
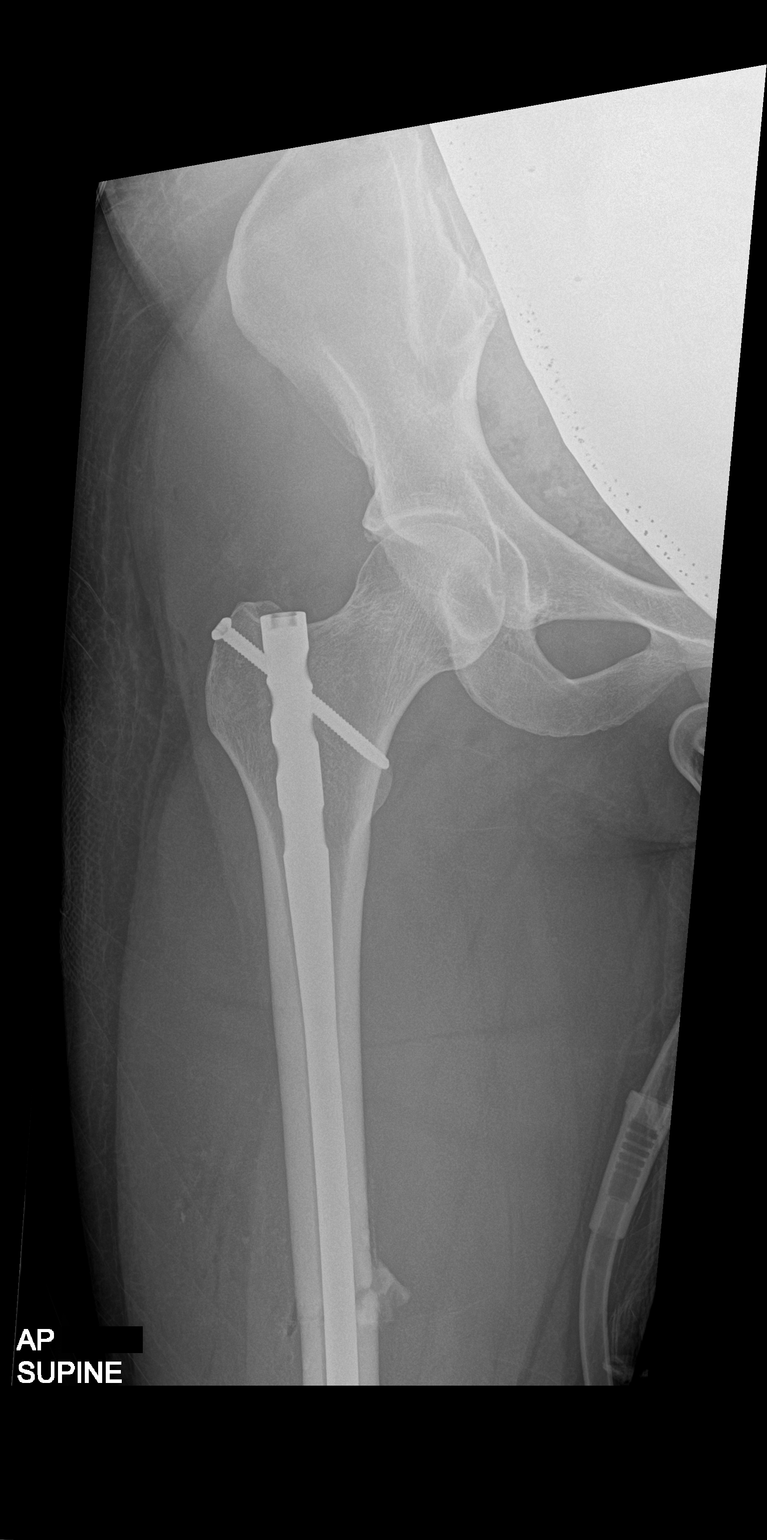

[femur lat (2 of 2)]
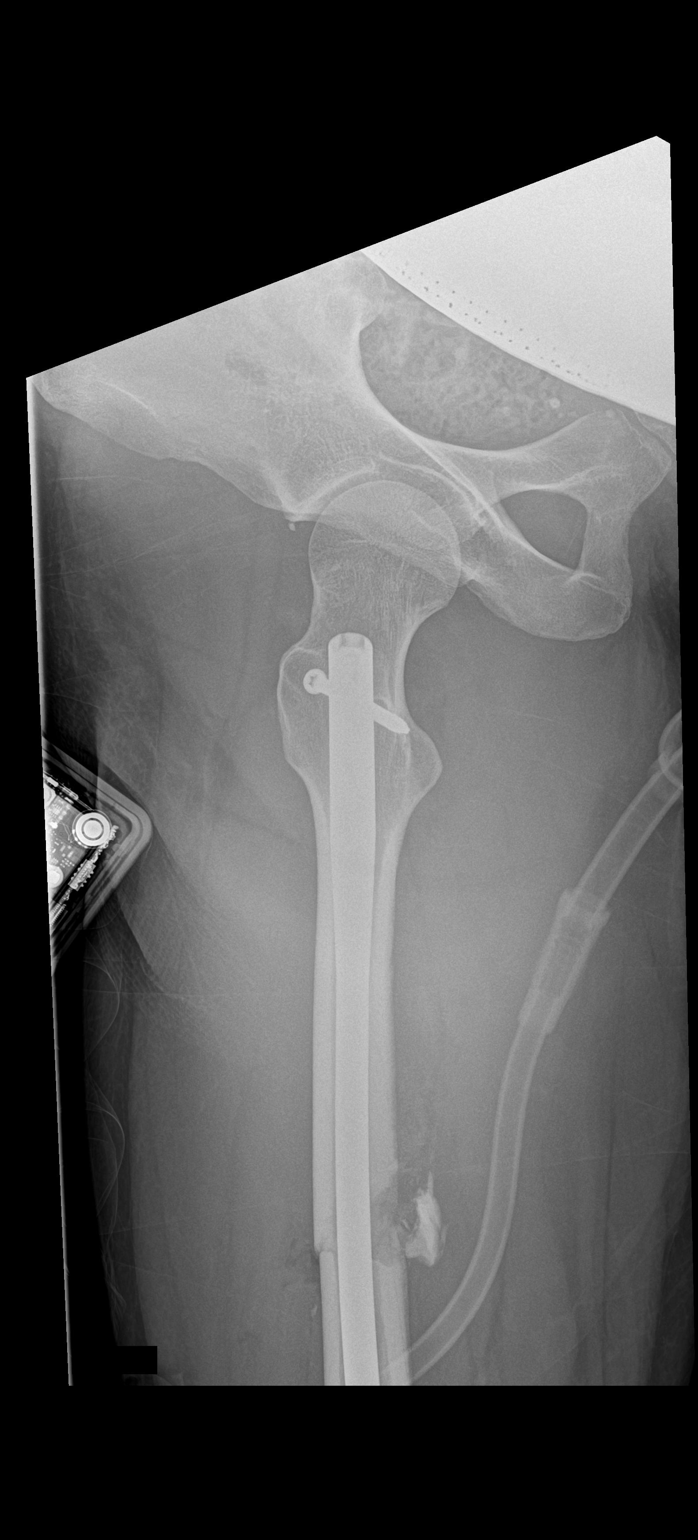

[femur ap (2 of 2)]
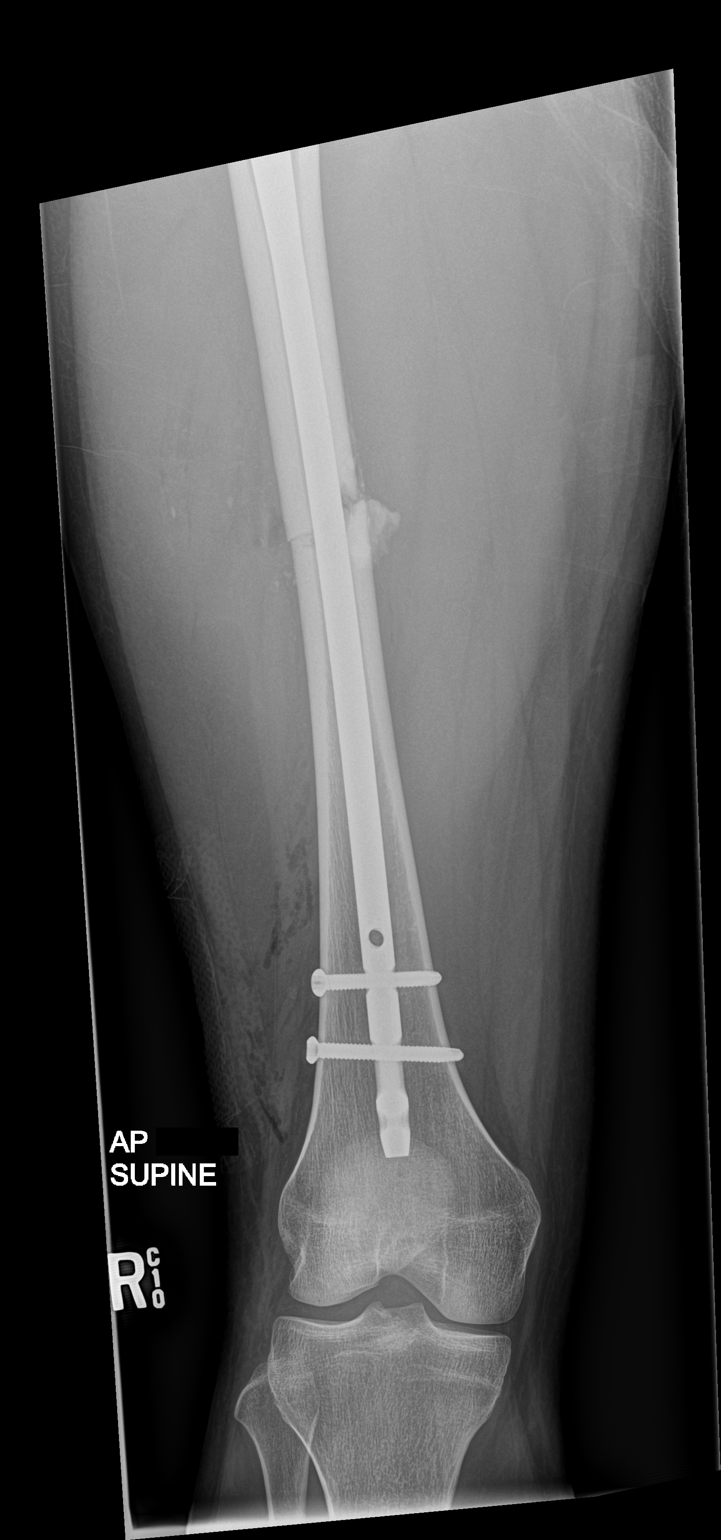

[4 of 4 positions shown; findings below may reference images not displayed]

FINDINGS: The patient has undergone intramedullary nail placement for a
fracture through the right femoral diaphysis. The alignment is
significantly improved. The hardware is intact. There are expected
postsurgical changes including subcutaneous gas and overlying soft
tissue edema.
IMPRESSION: Status post intramedullary nail placement for a right femoral
diaphyseal fracture.

## 2021-04-16 IMAGING — RF DG C-ARM 1-60 MIN
1 series · 7 of 7 positions shown · non-contrast
Comparison: Right femur radiographs-01/06/2019

FLUOROSCOPY TIME:  2 minutes, 22 seconds

CLINICAL DATA: Intramedullary rod fixation of femur fracture.

EXAM:
RIGHT FEMUR 2 VIEWS; DG C-ARM 1-60 MIN

[Series 1: run · 7 of 7 slices shown]
[im 1/7]
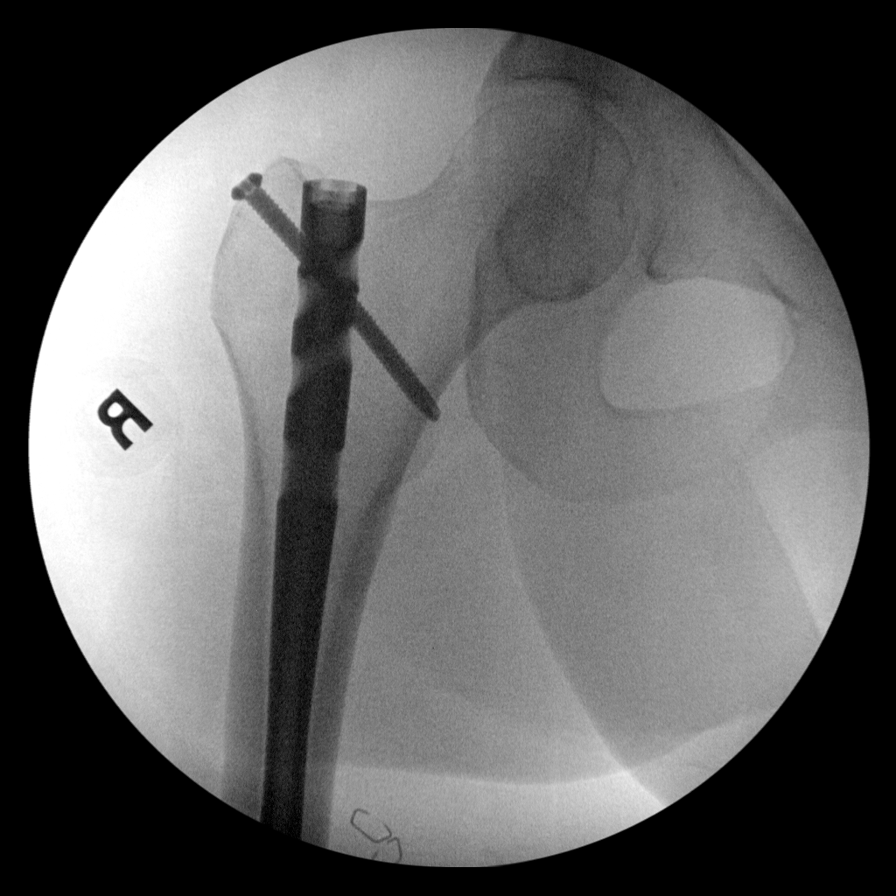
[im 2/7]
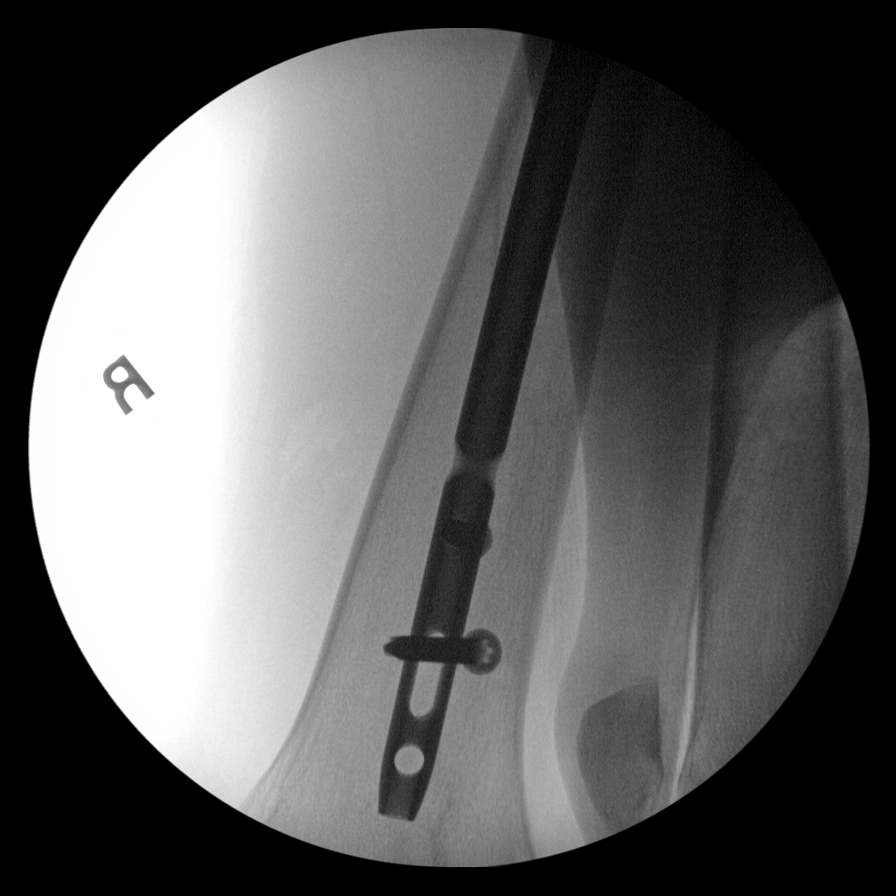
[im 3/7]
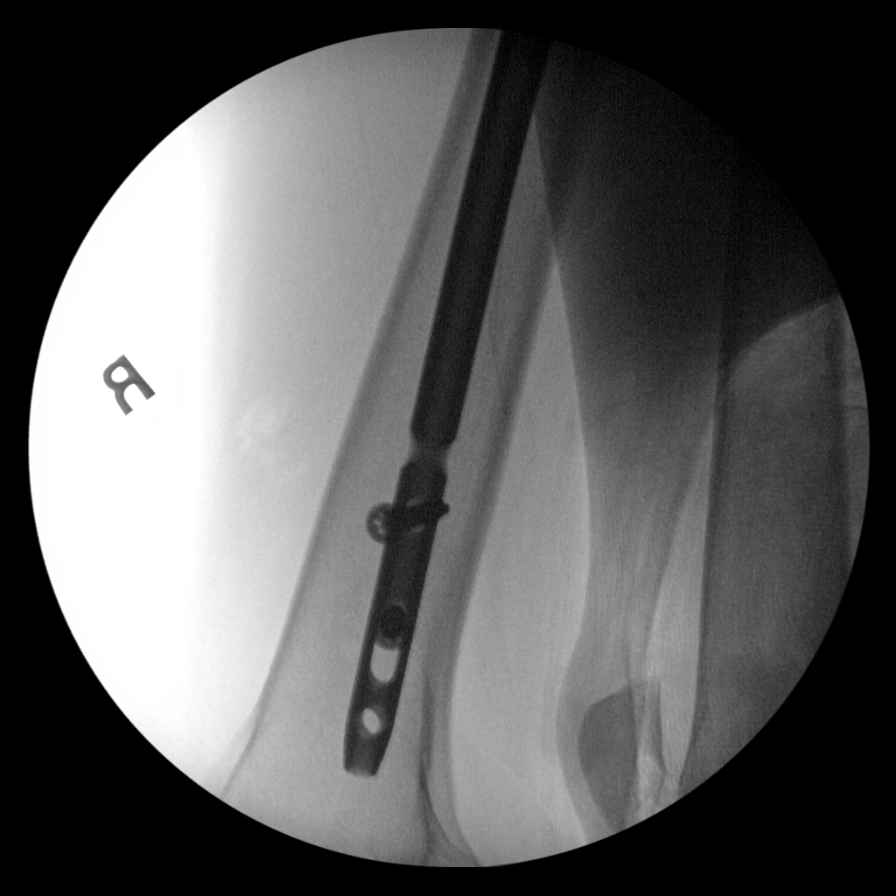
[im 4/7]
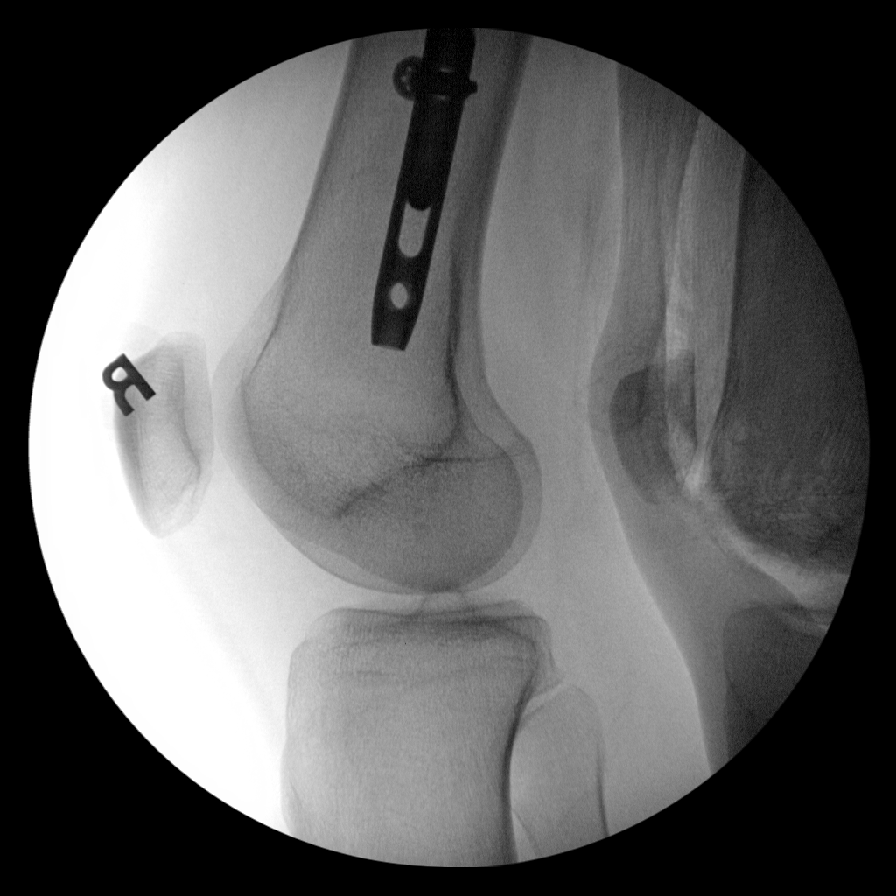
[im 5/7]
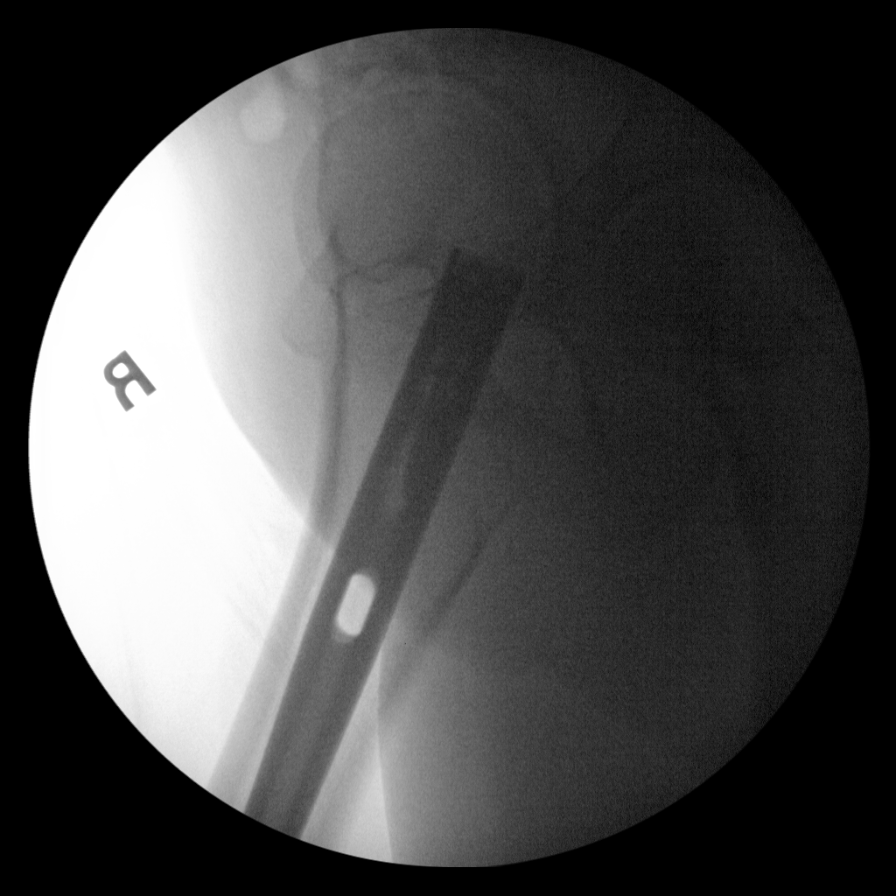
[im 6/7]
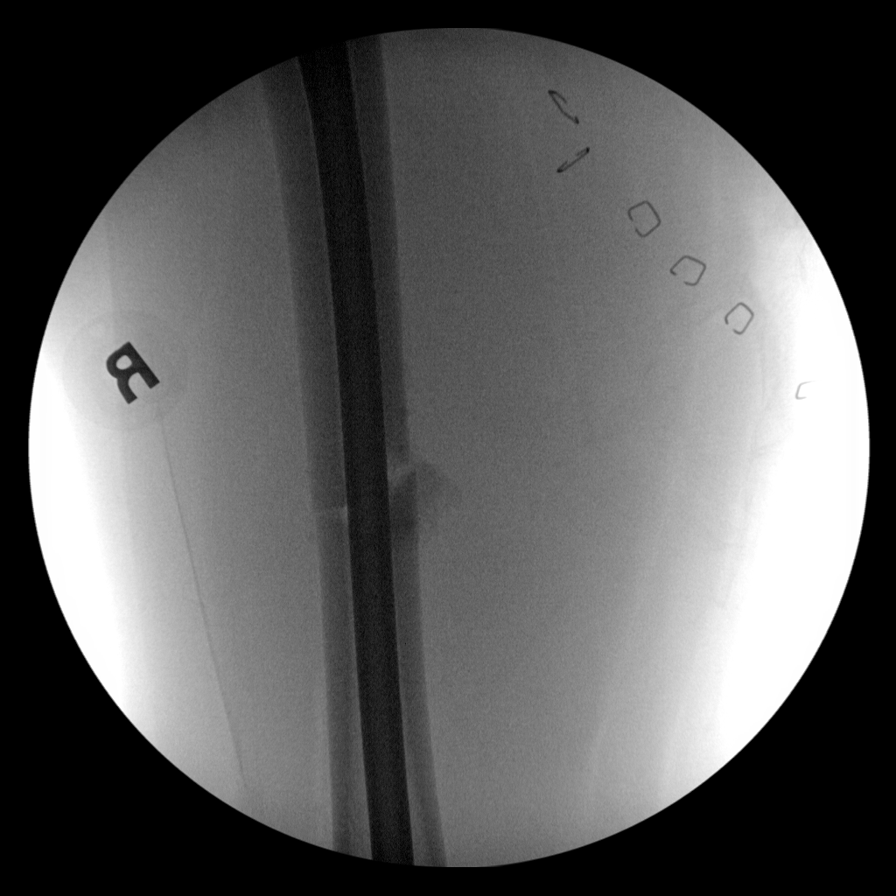
[im 7/7]
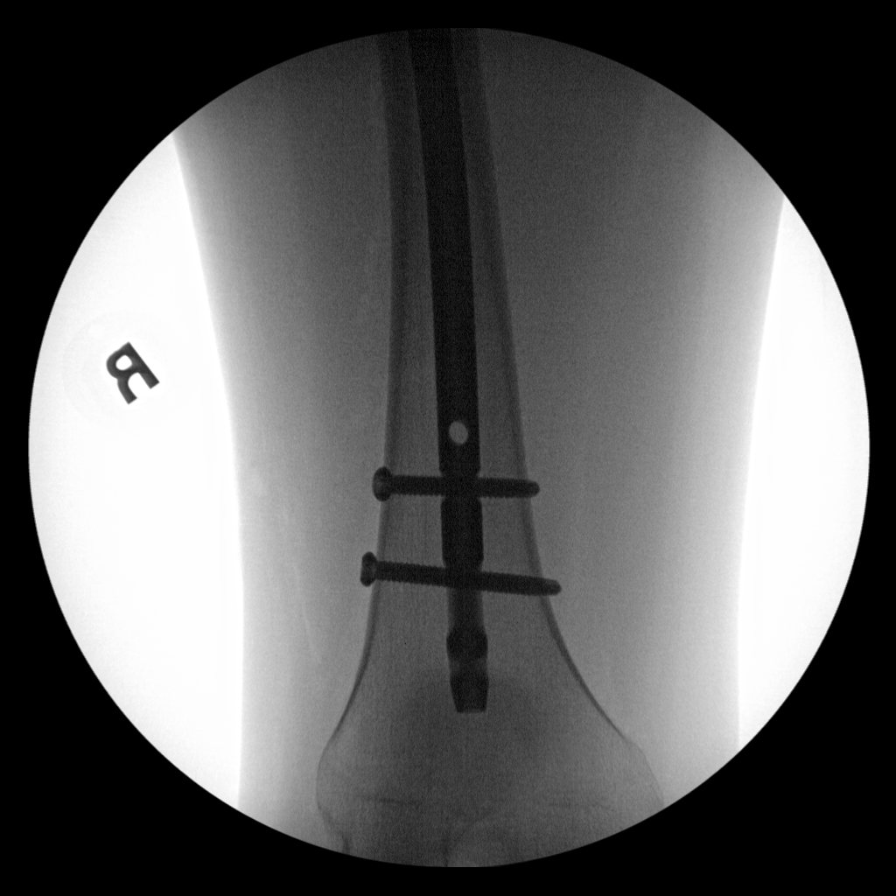

[7 of 7 positions shown; findings below may reference images not displayed]

FINDINGS: Seven spot intraoperative fluoroscopic images of the right femur are
provided for review

Provided images demonstrate the sequela of intramedullary rod
fixation of comminuted obliquely oriented midshaft femur fracture.

The proximal end of the femoral rod is transfixed with a single
obliquely oriented cancellous screw while the distal end is
transfixed with 2 obliquely oriented cancellous screws. Alignment
appears anatomic. Skin staples are noted about the operative site.
Minimal amount of expected adjacent subcutaneous emphysema. No
radiopaque foreign body.
IMPRESSION: Post uncomplicated right femoral intramedullary rod fixation.

## 2022-03-16 ENCOUNTER — Emergency Department (HOSPITAL_COMMUNITY): Payer: Medicaid Other

## 2022-03-16 ENCOUNTER — Encounter (HOSPITAL_COMMUNITY): Payer: Self-pay | Admitting: Emergency Medicine

## 2022-03-16 ENCOUNTER — Other Ambulatory Visit: Payer: Self-pay

## 2022-03-16 ENCOUNTER — Inpatient Hospital Stay (HOSPITAL_COMMUNITY)
Admission: EM | Admit: 2022-03-16 | Discharge: 2022-03-18 | DRG: 439 | Disposition: A | Discharge status: Home / Self Care | Payer: Medicaid Other | Attending: Internal Medicine | Admitting: Internal Medicine

## 2022-03-16 DIAGNOSIS — E669 Obesity, unspecified: Secondary | ICD-10-CM | POA: Diagnosis present

## 2022-03-16 DIAGNOSIS — F17210 Nicotine dependence, cigarettes, uncomplicated: Secondary | ICD-10-CM | POA: Diagnosis present

## 2022-03-16 DIAGNOSIS — F1910 Other psychoactive substance abuse, uncomplicated: Secondary | ICD-10-CM | POA: Diagnosis present

## 2022-03-16 DIAGNOSIS — R6510 Systemic inflammatory response syndrome (SIRS) of non-infectious origin without acute organ dysfunction: Secondary | ICD-10-CM | POA: Diagnosis not present

## 2022-03-16 DIAGNOSIS — F10139 Alcohol abuse with withdrawal, unspecified: Secondary | ICD-10-CM | POA: Diagnosis present

## 2022-03-16 DIAGNOSIS — Z6822 Body mass index (BMI) 22.0-22.9, adult: Secondary | ICD-10-CM

## 2022-03-16 DIAGNOSIS — E876 Hypokalemia: Secondary | ICD-10-CM | POA: Diagnosis present

## 2022-03-16 DIAGNOSIS — K852 Alcohol induced acute pancreatitis without necrosis or infection: Principal | ICD-10-CM | POA: Diagnosis present

## 2022-03-16 DIAGNOSIS — F1210 Cannabis abuse, uncomplicated: Secondary | ICD-10-CM | POA: Diagnosis present

## 2022-03-16 DIAGNOSIS — Z91013 Allergy to seafood: Secondary | ICD-10-CM

## 2022-03-16 DIAGNOSIS — Z87892 Personal history of anaphylaxis: Secondary | ICD-10-CM

## 2022-03-16 DIAGNOSIS — K859 Acute pancreatitis without necrosis or infection, unspecified: Secondary | ICD-10-CM | POA: Diagnosis present

## 2022-03-16 LAB — URINALYSIS, ROUTINE W REFLEX MICROSCOPIC
Bilirubin Urine: NEGATIVE
Glucose, UA: NEGATIVE mg/dL
Ketones, ur: 80 mg/dL — AB
Leukocytes,Ua: NEGATIVE
Nitrite: NEGATIVE
Protein, ur: NEGATIVE mg/dL
Specific Gravity, Urine: >1.046 — ABNORMAL HIGH (ref 1.005–1.030)
pH: 6 (ref 5.0–8.0)

## 2022-03-16 LAB — TRIGLYCERIDES: Triglycerides: 52 mg/dL (ref <150)

## 2022-03-16 LAB — COMPREHENSIVE METABOLIC PANEL
ALT: 16 U/L (ref 0–44)
AST: 27 U/L (ref 15–41)
Albumin: 4.1 g/dL (ref 3.5–5.0)
Alkaline Phosphatase: 66 U/L (ref 38–126)
Anion gap: 12 (ref 5–15)
BUN: 8 mg/dL (ref 6–20)
CO2: 20 mmol/L — ABNORMAL LOW (ref 22–32)
Calcium: 9.2 mg/dL (ref 8.9–10.3)
Chloride: 107 mmol/L (ref 98–111)
Creatinine, Ser: 0.79 mg/dL (ref 0.44–1.00)
GFR, Estimated: >60 mL/min (ref >60)
Glucose, Bld: 141 mg/dL — ABNORMAL HIGH (ref 70–99)
Potassium: 3.3 mmol/L — ABNORMAL LOW (ref 3.5–5.1)
Sodium: 139 mmol/L (ref 135–145)
Total Bilirubin: 1.2 mg/dL (ref 0.3–1.2)
Total Protein: 7.2 g/dL (ref 6.5–8.1)

## 2022-03-16 LAB — CBC
HCT: 37.9 % (ref 36.0–46.0)
Hemoglobin: 12.7 g/dL (ref 12.0–15.0)
MCH: 32.3 pg (ref 26.0–34.0)
MCHC: 33.5 g/dL (ref 30.0–36.0)
MCV: 96.4 fL (ref 80.0–100.0)
Platelets: 350 10*3/uL (ref 150–400)
RBC: 3.93 MIL/uL (ref 3.87–5.11)
RDW: 13.8 % (ref 11.5–15.5)
WBC: 12.3 10*3/uL — ABNORMAL HIGH (ref 4.0–10.5)
nRBC: 0 % (ref 0.0–0.2)

## 2022-03-16 LAB — RAPID URINE DRUG SCREEN, HOSP PERFORMED
Amphetamines: POSITIVE — AB
Barbiturates: NOT DETECTED
Benzodiazepines: NOT DETECTED
Cocaine: NOT DETECTED
Opiates: POSITIVE — AB
Tetrahydrocannabinol: POSITIVE — AB

## 2022-03-16 LAB — PHOSPHORUS: Phosphorus: 3.5 mg/dL (ref 2.5–4.6)

## 2022-03-16 LAB — LIPASE, BLOOD: Lipase: 258 U/L — ABNORMAL HIGH (ref 11–51)

## 2022-03-16 LAB — I-STAT BETA HCG BLOOD, ED (MC, WL, AP ONLY): I-stat hCG, quantitative: 19.6 m[IU]/mL — ABNORMAL HIGH (ref <5)

## 2022-03-16 LAB — HCG, QUANTITATIVE, PREGNANCY: hCG, Beta Chain, Quant, S: 1 m[IU]/mL (ref <5)

## 2022-03-16 LAB — HIV ANTIBODY (ROUTINE TESTING W REFLEX): HIV Screen 4th Generation wRfx: NONREACTIVE

## 2022-03-16 LAB — ETHANOL: Alcohol, Ethyl (B): <10 mg/dL (ref <10)

## 2022-03-16 LAB — MAGNESIUM: Magnesium: 2 mg/dL (ref 1.7–2.4)

## 2022-03-16 MED ORDER — NICOTINE 14 MG/24HR TD PT24
14.0000 mg | MEDICATED_PATCH | Freq: Every day | TRANSDERMAL | Status: DC
  Administered 2022-03-17 – 2022-03-18 (×2): 14 mg via TRANSDERMAL
  Filled 2022-03-16 (×2): qty 1

## 2022-03-16 MED ORDER — THIAMINE HCL 100 MG PO TABS
100.0000 mg | ORAL_TABLET | Freq: Every day | ORAL | Status: DC
  Administered 2022-03-16 – 2022-03-18 (×3): 100 mg via ORAL
  Filled 2022-03-16 (×3): qty 1

## 2022-03-16 MED ORDER — ONDANSETRON HCL 4 MG/2ML IJ SOLN: 4.0000 mg | Freq: Four times a day (QID) | INTRAMUSCULAR | Status: DC | PRN

## 2022-03-16 MED ORDER — POTASSIUM CHLORIDE CRYS ER 20 MEQ PO TBCR
20.0000 meq | EXTENDED_RELEASE_TABLET | Freq: Once | ORAL | Status: AC
Start: 2022-03-16 — End: 2022-03-16
  Administered 2022-03-16: 20 meq via ORAL
  Filled 2022-03-16: qty 1

## 2022-03-16 MED ORDER — LORAZEPAM 1 MG PO TABS: 1.0000 mg | ORAL_TABLET | ORAL | Status: DC | PRN

## 2022-03-16 MED ORDER — LORAZEPAM 2 MG/ML IJ SOLN: 0.0000 mg | Freq: Two times a day (BID) | INTRAMUSCULAR | Status: DC

## 2022-03-16 MED ORDER — THIAMINE HCL 100 MG/ML IJ SOLN: 100.0000 mg | Freq: Every day | INTRAMUSCULAR | Status: DC

## 2022-03-16 MED ORDER — FOLIC ACID 1 MG PO TABS
1.0000 mg | ORAL_TABLET | Freq: Every day | ORAL | Status: DC
  Administered 2022-03-16 – 2022-03-18 (×3): 1 mg via ORAL
  Filled 2022-03-16 (×3): qty 1

## 2022-03-16 MED ORDER — SODIUM CHLORIDE 0.9 % IV BOLUS
1000.0000 mL | Freq: Once | INTRAVENOUS | Status: AC
  Administered 2022-03-16: 1000 mL via INTRAVENOUS

## 2022-03-16 MED ORDER — ACETAMINOPHEN 325 MG PO TABS
650.0000 mg | ORAL_TABLET | Freq: Four times a day (QID) | ORAL | Status: DC | PRN
  Administered 2022-03-17 – 2022-03-18 (×2): 650 mg via ORAL
  Filled 2022-03-16 (×2): qty 2

## 2022-03-16 MED ORDER — ONDANSETRON 4 MG PO TBDP
4.0000 mg | ORAL_TABLET | Freq: Once | ORAL | Status: AC | PRN
  Administered 2022-03-16: 4 mg via ORAL
  Filled 2022-03-16: qty 1

## 2022-03-16 MED ORDER — ONDANSETRON HCL 4 MG PO TABS
4.0000 mg | ORAL_TABLET | Freq: Four times a day (QID) | ORAL | Status: DC | PRN
  Administered 2022-03-17: 4 mg via ORAL
  Filled 2022-03-16: qty 1

## 2022-03-16 MED ORDER — MORPHINE SULFATE (PF) 2 MG/ML IV SOLN
2.0000 mg | INTRAVENOUS | Status: AC | PRN
  Administered 2022-03-16 – 2022-03-17 (×3): 2 mg via INTRAVENOUS
  Filled 2022-03-16 (×3): qty 1

## 2022-03-16 MED ORDER — SODIUM CHLORIDE 0.9% FLUSH
3.0000 mL | Freq: Two times a day (BID) | INTRAVENOUS | Status: DC
  Administered 2022-03-16 – 2022-03-17 (×4): 3 mL via INTRAVENOUS

## 2022-03-16 MED ORDER — HYDROMORPHONE HCL 1 MG/ML IJ SOLN: 0.5000 mg | INTRAMUSCULAR | Status: DC | PRN

## 2022-03-16 MED ORDER — ADULT MULTIVITAMIN W/MINERALS CH
1.0000 | ORAL_TABLET | Freq: Every day | ORAL | Status: DC
  Administered 2022-03-16 – 2022-03-18 (×3): 1 via ORAL
  Filled 2022-03-16 (×3): qty 1

## 2022-03-16 MED ORDER — LORAZEPAM 2 MG/ML IJ SOLN: 0.0000 mg | Freq: Four times a day (QID) | INTRAMUSCULAR | Status: AC

## 2022-03-16 MED ORDER — IOHEXOL 300 MG/ML  SOLN
100.0000 mL | Freq: Once | INTRAMUSCULAR | Status: AC | PRN
  Administered 2022-03-16: 100 mL via INTRAVENOUS

## 2022-03-16 MED ORDER — PANTOPRAZOLE SODIUM 40 MG IV SOLR
40.0000 mg | Freq: Every day | INTRAVENOUS | Status: DC
  Administered 2022-03-16 – 2022-03-18 (×3): 40 mg via INTRAVENOUS
  Filled 2022-03-16 (×3): qty 10

## 2022-03-16 MED ORDER — ACETAMINOPHEN 650 MG RE SUPP: 650.0000 mg | Freq: Four times a day (QID) | RECTAL | Status: DC | PRN

## 2022-03-16 MED ORDER — POTASSIUM CHLORIDE CRYS ER 20 MEQ PO TBCR
20.0000 meq | EXTENDED_RELEASE_TABLET | Freq: Once | ORAL | Status: DC
  Filled 2022-03-16: qty 1

## 2022-03-16 MED ORDER — LORAZEPAM 2 MG/ML IJ SOLN: 1.0000 mg | INTRAMUSCULAR | Status: DC | PRN

## 2022-03-16 MED ORDER — ALBUTEROL SULFATE (2.5 MG/3ML) 0.083% IN NEBU: 2.5000 mg | INHALATION_SOLUTION | Freq: Four times a day (QID) | RESPIRATORY_TRACT | Status: DC | PRN

## 2022-03-16 MED ORDER — MORPHINE SULFATE (PF) 4 MG/ML IV SOLN
4.0000 mg | Freq: Once | INTRAVENOUS | Status: AC
  Administered 2022-03-16: 4 mg via INTRAVENOUS
  Filled 2022-03-16: qty 1

## 2022-03-16 MED ORDER — SODIUM CHLORIDE 0.9 % IV SOLN: INTRAVENOUS | Status: DC

## 2022-03-16 MED ORDER — PROCHLORPERAZINE EDISYLATE 10 MG/2ML IJ SOLN
10.0000 mg | Freq: Once | INTRAMUSCULAR | Status: AC
  Administered 2022-03-16: 10 mg via INTRAVENOUS
  Filled 2022-03-16: qty 2

## 2022-03-16 MED ORDER — ENOXAPARIN SODIUM 40 MG/0.4ML IJ SOSY
40.0000 mg | PREFILLED_SYRINGE | Freq: Every day | INTRAMUSCULAR | Status: DC
  Administered 2022-03-16 – 2022-03-18 (×3): 40 mg via SUBCUTANEOUS
  Filled 2022-03-16 (×3): qty 0.4

## 2022-03-16 MED ORDER — HYDROMORPHONE HCL 1 MG/ML IJ SOLN
1.0000 mg | Freq: Once | INTRAMUSCULAR | Status: AC
  Administered 2022-03-16: 1 mg via INTRAVENOUS
  Filled 2022-03-16: qty 1

## 2022-03-16 NOTE — ED Triage Notes (Signed)
Patient from home with abdominal pain.  Patient has been having right and left lower quadrant pain.  She vomited x2 with EMS.  Denies any possible pregnancy.  VSS with EMS.   ?

## 2022-03-16 NOTE — ED Notes (Signed)
Iv infusing

## 2022-03-16 NOTE — ED Notes (Signed)
Pt states unable to give urine sample at this time 

## 2022-03-16 NOTE — ED Provider Notes (Signed)
?[facility] EMERGENCY DEPARTMENT ?Provider Note ? ? ?CSN: [id number] ?Arrival date & time: 03/16/22  0549 ? ?  ? ?History ? ?Chief Complaint  ?Patient presents with  ? Abdominal Pain  ? ? ?Audrey Schultz is a 31 y.o. female with a 31 year old female with a past medical history significant for depression, marijuana abuse, alcohol abuse who presents with concern for acute abdominal pain for the last 1 week that is present in the abdomen radiating to the back associated with nausea, vomiting.  Patient does endorse heavy alcohol use.  She also endorses active marijuana use.  Patient denies history of gallstones previous abdominal surgery.  Patient denies any vaginal bleeding, vaginal discharge, dyspareunia, dysuria, hematuria.  Patient reports her last menstrual period was on April 9, she does not believe she is pregnant at this time.  Patient denies any chest pain, shortness of breath. ? ? ?Abdominal Pain ? ?  ? ?Home Medications ?Prior to Admission medications   ?Medication Sig Start Date End Date Taking? Authorizing Provider  ?acetaminophen (TYLENOL) 325 MG tablet Take 2 tablets (650 mg total) by mouth every 12 (twelve) hours. ?Patient taking differently: Take 650 mg by mouth every 12 (twelve) hours as needed for mild pain. 01/10/20  Yes Montez Morita, PA-C  ?   ? ?Allergies    ?Shellfish allergy   ? ?Review of Systems   ?Review of Systems  ?Gastrointestinal:  Positive for abdominal pain.  ?All other systems reviewed and are negative. ? ?Physical Exam ?Updated Vital Signs ?BP (!) 129/98 (BP Location: Right Arm)   Pulse (!) 107   Temp 97.9 ?F (36.6 ?C)   Resp 18   SpO2 100%  ?Physical Exam ?Vitals and nursing note reviewed.  ?Constitutional:   ?   General: She is in acute distress.  ?   Appearance: Normal appearance. She is ill-appearing.  ?   Comments: Patient in LLD, clutching legs to chest, moaning  ?HENT:  ?   Head: Normocephalic and atraumatic.  ?   Mouth/Throat:  ?   Mouth: Mucous membranes  are dry.  ?Eyes:  ?   General:     ?   Right eye: No discharge.     ?   Left eye: No discharge.  ?Cardiovascular:  ?   Rate and Rhythm: Regular rhythm. Tachycardia present.  ?   Heart sounds: No murmur heard. ?  No friction rub. No gallop.  ?Pulmonary:  ?   Effort: Pulmonary effort is normal.  ?   Breath sounds: Normal breath sounds.  ?Abdominal:  ?   General: Bowel sounds are normal.  ?   Palpations: Abdomen is soft.  ?   Comments: Significant tenderness palpation in the epigastric and right upper quadrant region.  Some guarding, no rebound, rigidity.  Normal bowel sounds throughout.  No evidence of ecchymosis.  ?Skin: ?   General: Skin is warm and dry.  ?   Capillary Refill: Capillary refill takes less than 2 seconds.  ?Neurological:  ?   Mental Status: She is alert and oriented to person, place, and time.  ?Psychiatric:     ?   Mood and Affect: Mood normal.     ?   Behavior: Behavior normal.  ? ? ?ED Results / Procedures / Treatments   ?Labs ?(all labs ordered are listed, but only abnormal results are displayed) ?Labs Reviewed  ?LIPASE, BLOOD - Abnormal; Notable for the following components:  ?    Result Value  ? Lipase 258 (*)   ?  All other components within normal limits  ?COMPREHENSIVE METABOLIC PANEL - Abnormal; Notable for the following components:  ? Potassium 3.3 (*)   ? CO2 20 (*)   ? Glucose, Bld 141 (*)   ? All other components within normal limits  ?CBC - Abnormal; Notable for the following components:  ? WBC 12.3 (*)   ? All other components within normal limits  ?I-STAT BETA HCG BLOOD, ED ([facility], [facility], [facility] ONLY) - Abnormal; Notable for the following components:  ? I-stat hCG, quantitative 19.6 (*)   ? All other components within normal limits  ?HCG, QUANTITATIVE, PREGNANCY  ?URINALYSIS, ROUTINE W REFLEX MICROSCOPIC  ?ETHANOL  ?RAPID URINE DRUG SCREEN, HOSP PERFORMED  ? ? ?EKG ?None ? ?Radiology ?CT ABDOMEN PELVIS W CONTRAST ? ?Result Date: 03/16/2022 ?CLINICAL DATA:  Acute pancreatitis. EXAM: CT ABDOMEN AND  PELVIS WITH CONTRAST TECHNIQUE: Multidetector CT imaging of the abdomen and pelvis was performed using the standard protocol following bolus administration of intravenous contrast. RADIATION DOSE REDUCTION: This exam was performed according to the departmental dose-optimization program which includes automated exposure control, adjustment of the mA and/or kV according to patient size and/or use of iterative reconstruction technique. CONTRAST:  OMNIPAQUE IOHEXOL 300 MG/ML  SOLN COMPARISON:  None. FINDINGS: Lower chest: Clear lung bases. Hepatobiliary: Liver normal in size. Decreased liver attenuation consistent with hepatic steatosis. No liver mass or focal lesion. Normal gallbladder. No bile duct dilation. Pancreas: Diffuse peripancreatic fluid and inflammation consistent with pancreatitis. No pancreatic mass or duct dilation. Pancreas diffusely enhances. No evidence of necrosis. No formed peripancreatic fluid collection is seen. Spleen: Normal in size without focal abnormality. Adrenals/Urinary Tract: Adrenal glands are unremarkable. Kidneys are normal, without renal calculi, focal lesion, or hydronephrosis. Bladder is unremarkable. Stomach/Bowel: Normal stomach. Small bowel and colon are normal in caliber. No wall thickening. No inflammation. No evidence of appendicitis. Vascular/Lymphatic: Portal vein, splenic vein and superior mesenteric vein are widely patent. No vascular abnormality. No enlarged lymph nodes. Reproductive: Uterus and bilateral adnexa are unremarkable. Other: Small amount of ascites tracks along the anterior pararenal spaces from the peripancreatic fluid to collect in the posterior pelvic recess. Musculoskeletal: No acute or significant osseous findings. IMPRESSION: 1. Acute uncomplicated pancreatitis. No discrete acute pancreatic fluid collection. No evidence of necrosis or venous thrombosis. 2. Small amount ascites. 3. Hepatic steatosis. Electronically Signed   By: Amie Portland M.D.    On: 03/16/2022 10:46  ? ?US Abdomen Limited RUQ (LIVER/GB) ? ?Result Date: 03/16/2022 ?CLINICAL DATA:  31 year old female with right upper quadrant pain. EXAM: ULTRASOUND ABDOMEN LIMITED RIGHT UPPER QUADRANT COMPARISON:  CT Chest, Abdomen, and Pelvis 01/07/2020. FINDINGS: Gallbladder: No gallstones or wall thickening visualized. No sonographic Murphy sign noted by sonographer. Chronic Phrygian cap, normal variant. Common bile duct: Diameter: 4 mm, normal. Liver: No focal lesion identified. Within normal limits in parenchymal echogenicity. Portal vein is patent on color Doppler imaging with normal direction of blood flow towards the liver. Other: Negative visible right kidney.  No free fluid. IMPRESSION: Normal right upper quadrant ultrasound. Electronically Signed   By: Odessa Fleming M.D.   On: 03/16/2022 08:44   ? ?Procedures ?Procedures  ? ? ?Medications Ordered in ED ?Medications  ?potassium chloride SA (KLOR-CON M) CR tablet 20 mEq (has no administration in time range)  ?prochlorperazine (COMPAZINE) injection 10 mg (has no administration in time range)  ?sodium chloride 0.9 % bolus 1,000 mL (has no administration in time range)  ?ondansetron (ZOFRAN-ODT) disintegrating tablet 4 mg (4 mg Oral  Given 03/16/22 0557)  ?sodium chloride 0.9 % bolus 1,000 mL (0 mLs Intravenous Stopped 03/16/22 1136)  ?morphine (PF) 4 MG/ML injection 4 mg (4 mg Intravenous Given 03/16/22 0840)  ?HYDROmorphone (DILAUDID) injection 1 mg (1 mg Intravenous Given 03/16/22 1025)  ?iohexol (OMNIPAQUE) 300 MG/ML solution 100 mL (100 mLs Intravenous Contrast Given 03/16/22 1037)  ? ? ?ED Course/ Medical Decision Making/ A&P ?  ?                        ?Medical Decision Making ?Amount and/or Complexity of Data Reviewed ?Labs: ordered. ?Radiology: ordered. ? ?Risk ?Prescription drug management. ? ? ?This patient presents to the ED for concern of abdominal pain radiating to the back, nausea, vomiting, this involves an extensive number of treatment options, and  is a complaint that carries with it a high risk of complications and morbidity. The emergent differential diagnosis prior to evaluation includes, but is not limited to, gastroenteritis, marijuana hypereme

## 2022-03-16 NOTE — Progress Notes (Signed)
Patient arrived to unit, alert, verbally responsive and orientated, made familiar with surroundings. ?

## 2022-03-16 NOTE — H&P (Signed)
?History and Physical  ? ? ?Patient: Audrey Schultz IFO:[id number] DOB: 29-Apr-1991 ?DOA: 03/16/2022 ?DOS: the patient was seen and examined on 03/16/2022 ?PCP: Pcp, No  ?Patient coming from: Home ? ?Chief Complaint:  ?Chief Complaint  ?Patient presents with  ? Abdominal Pain  ? ?HPI: Audrey Schultz is a 31 y.o. female with medical history significant of tobacco, alcohol, marijuana abuse who presents with complaints of 1 week history of abdominal pain.  She reports that the pain is severe in epigastric region.  Pain is described as crampy, sharp, and achy with radiation to her back.  Associated symptoms include nausea and vomiting.  Emesis has been nonbloody appearance.  She denies having any fever, chills, constipation, diarrhea, dysuria, vaginal discharge, chest pain, cough, or shortness of breath.  She drinks about fifth of liquor per day on average, smokes 1/3 pack of cigarettes per day, and admits to occasional marijuana use. ? ?Upon admission into the emergency department patient was noted to be afebrile with pulse 107-110, and all other vital signs maintained.  Labs significant for WBC 12.3, potassium 3.3, glucose 141, lipase 258, and LFTs within normal limits.  Quantitative serum beta-hCG was 1.  Right upper quadrant ultrasound was within normal limits.  CT scan of the abdomen pelvis noted acute uncomplicated pancreatitis.  Patient has been bolused 1 L of normal saline IV fluids, given 20 mEq of potassium chloride, antiemetics, and pain medication.  TRH called to admit. ? ? ?Review of Systems: As mentioned in the history of present illness. All other systems reviewed and are negative. ?Past Medical History:  ?Diagnosis Date  ? Anemia   ? hx w/pregnancy  ? Dysmenorrhea   ? Medical history non-contributory   ? Nicotine dependence 01/10/2020  ? Vitamin D deficiency 01/10/2020  ? ?Past Surgical History:  ?Procedure Laterality Date  ? INTRAMEDULLARY (IM) NAIL INTERTROCHANTERIC Right 01/07/2020  ? Procedure:  INTRAMEDULLARY (IM) NAIL INTERTROCHANTRIC;  Surgeon: Myrene Galas, MD;  Location: [facility] OR;  Service: Orthopedics;  Laterality: Right;  ? ORIF ANKLE FRACTURE Left 01/20/2020  ? Procedure: OPEN REDUCTION INTERNAL FIXATION (ORIF) ANKLE FRACTURE;  Surgeon: Myrene Galas, MD;  Location: [facility] OR;  Service: Orthopedics;  Laterality: Left;  ? ?Social History:  reports that she has been smoking cigarettes. She has a 2.75 pack-year smoking history. She has never used smokeless tobacco. She reports current alcohol use of about 4.0 - 5.0 standard drinks per week. She reports current drug use. Drug: Marijuana. ? ?Allergies  ?Allergen Reactions  ? Shellfish Allergy Anaphylaxis  ? ? ?Family History  ?Problem Relation Age of Onset  ? Hypertension Mother   ? Diabetes Mother   ? ? ?Prior to Admission medications   ?Medication Sig Start Date End Date Taking? Authorizing Provider  ?acetaminophen (TYLENOL) 325 MG tablet Take 2 tablets (650 mg total) by mouth every 12 (twelve) hours. ?Patient taking differently: Take 650 mg by mouth every 12 (twelve) hours as needed for mild pain. 01/10/20  Yes Montez Morita, PA-C  ? ? ?Physical Exam: ?Vitals:  ? 03/16/22 0553 03/16/22 0846 03/16/22 1125  ?BP: (!) 140/101 122/79 (!) 129/98  ?Pulse: (!) 110 (!) 107   ?Resp: 17 18   ?Temp: 97.9 ?F (36.6 ?C)    ?SpO2: 97% 100% 100%  ? ?Constitutional: Young female who appears to be lethargic but easily arousable ?Eyes: PERRL, lids and conjunctivae normal ?ENMT: Mucous membranes are moist. Posterior pharynx clear of any exudate or lesions.  ?Neck: normal, supple, no masses, no thyromegaly ?Respiratory:  Normal respiratory effort without significant wheezes or rhonchi appreciated. ?Cardiovascular: Tachycardic, no murmurs / rubs / gallops. No extremity edema.  ?Abdomen: Epigastric tenderness and right CVA tenderness appreciated.  Bowel sounds present all 4 quadrants. ?Musculoskeletal: no clubbing / cyanosis. No joint deformity upper and lower extremities. Good ROM,  no contractures. Normal muscle tone.  ?Skin: no rashes, lesions, ulcers. No induration ?Neurologic: CN 2-12 grossly intact. Sensation intact, DTR normal. Strength 5/5 in all 4.  ?Psychiatric: Normal judgment and insight. Oriented x 3. Normal mood.  ? ?Data Reviewed: ? ?Reviewed patient's labs and imaging studies as noted above in HPI ? ?Assessment and Plan: ?Pancreatitis ?Acute.  Patient presents with complaints of 1 week history of epigastric abdominal pain with nausea and vomiting. Labs significant for lipase of 258.  CT scan of the abdomen and pelvis significant for uncomplicated pancreatitis.  Patient had been given pain medication, IV fluid bolus, and antiemetics.  Pancreatitis thought to be provoked secondary to alcohol abuse.   ?-Admit to a medical telemetry bed ?-Monitor intake and output ?-Incentive spirometry ?-Check triglyceride level ?-Clear liquid diet and advance as tolerated ?-Bolus 1 L of normal saline IV fluids then placed on a rate of 200 mL/h ?-Morphine IV as needed for pain ?-Antiemetics as needed   ? ?SIRS ?Patient was noted to be tachycardic with WBC elevated 12.3 meeting SIRS criteria.  CT scan of abdomen pelvis noted uncomplicated pancreatitis. Urinalysis did not show significant signs of infection.  Suspect secondary to pancreatitis at this time. ?-Continue to monitor.  If patient develops fever with check blood cultures and started on empiric antibiotics. ? ?Hypokalemia ?Acute.  Initial potassium noted to be 3.2.  Patient has been given 20 mEq of potassium chloride ?-Give potassium chlor 20 mEq p.o. ?-Continue to monitor and replace as needed ? ?Polysubstance abuse ?Patient admits to alcohol abuse drinking 1/5 of liquor per day on average, smoking 1/3 pack of cigarettes per day on average, and marijuana use.  UDS was positive for  amphetamines, marijuana, and opiates(which were likely given in the ED) ?-Nicotine patch offered ?-CIWA protocols with Ativan ? ? ? ?GI prophylaxis: Protonix  IV ?DVT prophylaxis: Lovenox ?Advance Care Planning:   Code Status: Full Code   ? ?Consults: None ? ?Family Communication: Patient's father updated over the phone ? ?Severity of Illness: ?The appropriate patient status for this patient is OBSERVATION. Observation status is judged to be reasonable and necessary in order to provide the required intensity of service to ensure the patient's safety. The patient's presenting symptoms, physical exam findings, and initial radiographic and laboratory data in the context of their medical condition is felt to place them at decreased risk for further clinical deterioration. Furthermore, it is anticipated that the patient will be medically stable for discharge from the hospital within 2 midnights of admission.  ? ?Author: ?Clydie Braun, MD ?03/16/2022 11:41 AM ? ?For on call review [email or web address].  ?

## 2022-03-17 DIAGNOSIS — Z6822 Body mass index (BMI) 22.0-22.9, adult: Secondary | ICD-10-CM | POA: Diagnosis not present

## 2022-03-17 DIAGNOSIS — K859 Acute pancreatitis without necrosis or infection, unspecified: Secondary | ICD-10-CM | POA: Diagnosis present

## 2022-03-17 DIAGNOSIS — F1910 Other psychoactive substance abuse, uncomplicated: Secondary | ICD-10-CM | POA: Diagnosis not present

## 2022-03-17 DIAGNOSIS — E876 Hypokalemia: Secondary | ICD-10-CM | POA: Diagnosis present

## 2022-03-17 DIAGNOSIS — E669 Obesity, unspecified: Secondary | ICD-10-CM | POA: Diagnosis present

## 2022-03-17 DIAGNOSIS — F10139 Alcohol abuse with withdrawal, unspecified: Secondary | ICD-10-CM | POA: Diagnosis present

## 2022-03-17 DIAGNOSIS — F1210 Cannabis abuse, uncomplicated: Secondary | ICD-10-CM | POA: Diagnosis present

## 2022-03-17 DIAGNOSIS — F17210 Nicotine dependence, cigarettes, uncomplicated: Secondary | ICD-10-CM | POA: Diagnosis present

## 2022-03-17 DIAGNOSIS — K852 Alcohol induced acute pancreatitis without necrosis or infection: Secondary | ICD-10-CM | POA: Diagnosis present

## 2022-03-17 DIAGNOSIS — Z87892 Personal history of anaphylaxis: Secondary | ICD-10-CM | POA: Diagnosis not present

## 2022-03-17 DIAGNOSIS — Z91013 Allergy to seafood: Secondary | ICD-10-CM | POA: Diagnosis not present

## 2022-03-17 LAB — CBC
HCT: 33.9 % — ABNORMAL LOW (ref 36.0–46.0)
Hemoglobin: 11 g/dL — ABNORMAL LOW (ref 12.0–15.0)
MCH: 31.2 pg (ref 26.0–34.0)
MCHC: 32.4 g/dL (ref 30.0–36.0)
MCV: 96 fL (ref 80.0–100.0)
Platelets: 281 10*3/uL (ref 150–400)
RBC: 3.53 MIL/uL — ABNORMAL LOW (ref 3.87–5.11)
RDW: 13.8 % (ref 11.5–15.5)
WBC: 10.7 10*3/uL — ABNORMAL HIGH (ref 4.0–10.5)
nRBC: 0 % (ref 0.0–0.2)

## 2022-03-17 LAB — COMPREHENSIVE METABOLIC PANEL
ALT: 11 U/L (ref 0–44)
AST: 17 U/L (ref 15–41)
Albumin: 3.1 g/dL — ABNORMAL LOW (ref 3.5–5.0)
Alkaline Phosphatase: 53 U/L (ref 38–126)
Anion gap: 6 (ref 5–15)
BUN: <5 mg/dL — ABNORMAL LOW (ref 6–20)
CO2: 22 mmol/L (ref 22–32)
Calcium: 8.2 mg/dL — ABNORMAL LOW (ref 8.9–10.3)
Chloride: 107 mmol/L (ref 98–111)
Creatinine, Ser: 0.5 mg/dL (ref 0.44–1.00)
GFR, Estimated: >60 mL/min (ref >60)
Glucose, Bld: 108 mg/dL — ABNORMAL HIGH (ref 70–99)
Potassium: 3 mmol/L — ABNORMAL LOW (ref 3.5–5.1)
Sodium: 135 mmol/L (ref 135–145)
Total Bilirubin: 0.5 mg/dL (ref 0.3–1.2)
Total Protein: 6 g/dL — ABNORMAL LOW (ref 6.5–8.1)

## 2022-03-17 MED ORDER — PROCHLORPERAZINE EDISYLATE 10 MG/2ML IJ SOLN
10.0000 mg | Freq: Four times a day (QID) | INTRAMUSCULAR | Status: DC | PRN
  Administered 2022-03-17 (×2): 10 mg via INTRAVENOUS
  Filled 2022-03-17 (×2): qty 2

## 2022-03-17 MED ORDER — HYDROMORPHONE HCL 1 MG/ML IJ SOLN
0.5000 mg | INTRAMUSCULAR | Status: DC | PRN
  Administered 2022-03-17 – 2022-03-18 (×5): 0.5 mg via INTRAVENOUS
  Filled 2022-03-17 (×5): qty 0.5

## 2022-03-17 NOTE — Progress Notes (Addendum)
?      ?                 PROGRESS NOTE ? ?      ?PATIENT DETAILS ?Name: Audrey Schultz ?Age: 31 y.o. ?Sex: female ?Date of Birth: 03/01/1991 ?Admit Date: 03/16/2022 ?Admitting Physician Clydie Braun, MD ?PCP:Pcp, No ? ?Brief Summary: ?Patient is a 31 y.o.  female with history of EtOH/tobacco/marijuana use-who presented with 1 week history of abdominal pain-found to have acute alcoholic pancreatitis.  See below for further details. ? ? ?Significant events: ?4/15>> admit for abdominal pain-found to have acute pancreatitis ? ?Significant studies: ?4/15>> RUQ ultrasound: No gallstones ?4/15>> CT abdomen/pelvis:Acute uncomplicated pancreatitis.  No necrosis. ? ?Significant microbiology data: ?None ? ?Procedures: ?None ? ?Consults: ?None  ? ?Subjective: ?Continues to have abdominal pain-but better than yesterday. ? ?Objective: ?Vitals: ?Blood pressure (!) 132/95, pulse 100, temperature 98.7 ?F (37.1 ?C), temperature source Oral, resp. rate 16, height 5\' 4"  (1.626 m), weight 60.2 kg, SpO2 96 %.  ? ?Exam: ?Gen Exam:Alert awake-not in any distress ?HEENT:atraumatic, normocephalic ?Chest: B/L clear to auscultation anteriorly ?CVS:S1S2 regular ?Abdomen: Soft-mildly tender in epigastric area. ?Extremities:no edema ?Neurology: Non focal ?Skin: no rash ? ?Pertinent Labs/Radiology: ? ?  Latest Ref Rng & Units 03/17/2022  ?  1:10 AM 03/16/2022  ?  5:54 AM 10/27/2020  ? 10:04 PM  ?CBC  ?WBC 4.0 - 10.5 K/uL 10.7   12.3   11.0    ?Hemoglobin 12.0 - 15.0 g/dL 10/29/2020   45.3   64.6    ?Hematocrit 36.0 - 46.0 % 33.9   37.9   41.5    ?Platelets 150 - 400 K/uL 281   350   355    ?  ?Lab Results  ?Component Value Date  ? NA 135 03/17/2022  ? K 3.0 (L) 03/17/2022  ? CL 107 03/17/2022  ? CO2 22 03/17/2022  ?  ? ? ?Assessment/Plan: ?Acute alcoholic pancreatitis: Some clinical improvement overnight-still with significant abdominal pain-continue supportive care-continue clear liquids for 1 additional day-on as needed  narcotics/antiemetics. ? ?Alcohol withdrawal: Minimal tremulous but awake and alert.  Last drink was either on 4/12 or 4/13 per patient.  On Ativan per CIWA protocol. ? ?Hypokalemia: Replete and recheck. ? ?Polysubstance abuse: Urine drug screen positive for amphetamines/opiates/cannabinoids-known history of EtOH use (1/5 of tequila daily).  We will continue to counsel over the next few days. ? ?BMI: ?Estimated body mass index is 22.78 kg/m? as calculated from the following: ?  Height as of this encounter: 5\' 4"  (1.626 m). ?  Weight as of this encounter: 60.2 kg.  ? ?Code status: ?  Code Status: Full Code  ? ?DVT Prophylaxis: ?enoxaparin (LOVENOX) injection 40 mg Start: 03/16/22 1215 ?  ?Family Communication: None at bedside ? ? ?Disposition Plan: ?Status is: Observation ?The patient will require care spanning > 2 midnights and should be moved to inpatient because: Acute alcoholic pancreatitis-not yet stable for discharge. ?  ?Planned Discharge Destination:Home ? ? ?Diet: ?Diet Order   ? ?       ?  Diet clear liquid Room service appropriate? Yes; Fluid consistency: Thin  Diet effective now       ?  ? ?  ?  ? ?  ?  ? ? ?Antimicrobial agents: ?Anti-infectives (From admission, onward)  ? ? None  ? ?  ? ? ? ?MEDICATIONS: ?Scheduled Meds: ? enoxaparin (LOVENOX) injection  40 mg Subcutaneous Daily  ? folic acid  1 mg  Oral Daily  ? LORazepam  0-4 mg Intravenous Q6H  ? Followed by  ? [START ON 03/18/2022] LORazepam  0-4 mg Intravenous Q12H  ? multivitamin with minerals  1 tablet Oral Daily  ? nicotine  14 mg Transdermal Daily  ? pantoprazole (PROTONIX) IV  40 mg Intravenous Daily  ? potassium chloride  20 mEq Oral Once  ? sodium chloride flush  3 mL Intravenous Q12H  ? thiamine  100 mg Oral Daily  ? Or  ? thiamine  100 mg Intravenous Daily  ? ?Continuous Infusions: ? sodium chloride 200 mL/hr at 03/17/22 0600  ? ?PRN Meds:.acetaminophen **OR** acetaminophen, albuterol, HYDROmorphone (DILAUDID) injection, LORazepam **OR**  LORazepam, ondansetron **OR** ondansetron (ZOFRAN) IV, prochlorperazine ? ? ?I have personally reviewed following labs and imaging studies ? ?LABORATORY DATA: ?CBC: ?Recent Labs  ?Lab 03/16/22 ?[id number] 03/17/22 ?0110  ?WBC 12.3* 10.7*  ?HGB 12.7 11.0*  ?HCT 37.9 33.9*  ?MCV 96.4 96.0  ?PLT 350 281  ? ? ?Basic Metabolic Panel: ?Recent Labs  ?Lab 03/16/22 ?[id number] 03/16/22 ?1220 03/17/22 ?0110  ?NA 139  --  135  ?K 3.3*  --  3.0*  ?CL 107  --  107  ?CO2 20*  --  22  ?GLUCOSE 141*  --  108*  ?BUN 8  --  <5*  ?CREATININE 0.79  --  0.50  ?CALCIUM 9.2  --  8.2*  ?MG  --  2.0  --   ?PHOS  --  3.5  --   ? ? ?GFR: ?Estimated Creatinine Clearance: 89.6 mL/min (by C-G formula based on SCr of 0.5 mg/dL). ? ?Liver Function Tests: ?Recent Labs  ?Lab 03/16/22 ?[id number] 03/17/22 ?0110  ?AST 27 17  ?ALT 16 11  ?ALKPHOS 66 53  ?BILITOT 1.2 0.5  ?PROT 7.2 6.0*  ?ALBUMIN 4.1 3.1*  ? ?Recent Labs  ?Lab 03/16/22 ?[id number]  ?LIPASE 258*  ? ?No results for input(s): AMMONIA in the last 168 hours. ? ?Coagulation Profile: ?No results for input(s): INR, PROTIME in the last 168 hours. ? ?Cardiac Enzymes: ?No results for input(s): CKTOTAL, CKMB, CKMBINDEX, TROPONINI in the last 168 hours. ? ?BNP (last 3 results) ?No results for input(s): PROBNP in the last 8760 hours. ? ?Lipid Profile: ?Recent Labs  ?  03/16/22 ?1220  ?TRIG 52  ? ? ?Thyroid Function Tests: ?No results for input(s): TSH, T4TOTAL, FREET4, T3FREE, THYROIDAB in the last 72 hours. ? ?Anemia Panel: ?No results for input(s): VITAMINB12, FOLATE, FERRITIN, TIBC, IRON, RETICCTPCT in the last 72 hours. ? ?Urine analysis: ?   ?Component Value Date/Time  ? COLORURINE YELLOW 03/16/2022 1244  ? APPEARANCEUR HAZY (A) 03/16/2022 1244  ? LABSPEC >1.046 (H) 03/16/2022 1244  ? PHURINE 6.0 03/16/2022 1244  ? GLUCOSEU NEGATIVE 03/16/2022 1244  ? HGBUR SMALL (A) 03/16/2022 1244  ? BILIRUBINUR NEGATIVE 03/16/2022 1244  ? KETONESUR 80 (A) 03/16/2022 1244  ? PROTEINUR NEGATIVE 03/16/2022 1244  ? UROBILINOGEN 0.2  11/16/2014 0607  ? NITRITE NEGATIVE 03/16/2022 1244  ? LEUKOCYTESUR NEGATIVE 03/16/2022 1244  ? ? ?Sepsis Labs: ?Lactic Acid, Venous ?   ?Component Value Date/Time  ? LATICACIDVEN 3.2 (HH) 01/07/2020 0023  ? ? ?MICROBIOLOGY: ?No results found for this or any previous visit (from the past 240 hour(s)). ? ?RADIOLOGY STUDIES/RESULTS: ?CT ABDOMEN PELVIS W CONTRAST ? ?Result Date: 03/16/2022 ?CLINICAL DATA:  Acute pancreatitis. EXAM: CT ABDOMEN AND PELVIS WITH CONTRAST TECHNIQUE: Multidetector CT imaging of the abdomen and pelvis was performed using the standard protocol following bolus administration of intravenous contrast. RADIATION  DOSE REDUCTION: This exam was performed according to the departmental dose-optimization program which includes automated exposure control, adjustment of the mA and/or kV according to patient size and/or use of iterative reconstruction technique. CONTRAST:  OMNIPAQUE IOHEXOL 300 MG/ML  SOLN COMPARISON:  None. FINDINGS: Lower chest: Clear lung bases. Hepatobiliary: Liver normal in size. Decreased liver attenuation consistent with hepatic steatosis. No liver mass or focal lesion. Normal gallbladder. No bile duct dilation. Pancreas: Diffuse peripancreatic fluid and inflammation consistent with pancreatitis. No pancreatic mass or duct dilation. Pancreas diffusely enhances. No evidence of necrosis. No formed peripancreatic fluid collection is seen. Spleen: Normal in size without focal abnormality. Adrenals/Urinary Tract: Adrenal glands are unremarkable. Kidneys are normal, without renal calculi, focal lesion, or hydronephrosis. Bladder is unremarkable. Stomach/Bowel: Normal stomach. Small bowel and colon are normal in caliber. No wall thickening. No inflammation. No evidence of appendicitis. Vascular/Lymphatic: Portal vein, splenic vein and superior mesenteric vein are widely patent. No vascular abnormality. No enlarged lymph nodes. Reproductive: Uterus and bilateral adnexa are  unremarkable. Other: Small amount of ascites tracks along the anterior pararenal spaces from the peripancreatic fluid to collect in the posterior pelvic recess. Musculoskeletal: No acute or significant osseous findings. IMPRESSION: 1. Acute uncomp

## 2022-03-17 NOTE — Plan of Care (Signed)

## 2022-03-18 DIAGNOSIS — F1910 Other psychoactive substance abuse, uncomplicated: Secondary | ICD-10-CM | POA: Diagnosis not present

## 2022-03-18 DIAGNOSIS — E876 Hypokalemia: Secondary | ICD-10-CM | POA: Diagnosis not present

## 2022-03-18 DIAGNOSIS — K852 Alcohol induced acute pancreatitis without necrosis or infection: Secondary | ICD-10-CM | POA: Diagnosis not present

## 2022-03-18 LAB — COMPREHENSIVE METABOLIC PANEL
ALT: 9 U/L (ref 0–44)
AST: 15 U/L (ref 15–41)
Albumin: 2.6 g/dL — ABNORMAL LOW (ref 3.5–5.0)
Alkaline Phosphatase: 50 U/L (ref 38–126)
Anion gap: 5 (ref 5–15)
BUN: <5 mg/dL — ABNORMAL LOW (ref 6–20)
CO2: 23 mmol/L (ref 22–32)
Calcium: 7.9 mg/dL — ABNORMAL LOW (ref 8.9–10.3)
Chloride: 107 mmol/L (ref 98–111)
Creatinine, Ser: 0.48 mg/dL (ref 0.44–1.00)
GFR, Estimated: >60 mL/min (ref >60)
Glucose, Bld: 93 mg/dL (ref 70–99)
Potassium: 2.6 mmol/L — CL (ref 3.5–5.1)
Sodium: 135 mmol/L (ref 135–145)
Total Bilirubin: 0.8 mg/dL (ref 0.3–1.2)
Total Protein: 5.1 g/dL — ABNORMAL LOW (ref 6.5–8.1)

## 2022-03-18 LAB — BASIC METABOLIC PANEL
Anion gap: 7 (ref 5–15)
BUN: <5 mg/dL — ABNORMAL LOW (ref 6–20)
CO2: 23 mmol/L (ref 22–32)
Calcium: 8.4 mg/dL — ABNORMAL LOW (ref 8.9–10.3)
Chloride: 105 mmol/L (ref 98–111)
Creatinine, Ser: 0.48 mg/dL (ref 0.44–1.00)
GFR, Estimated: >60 mL/min (ref >60)
Glucose, Bld: 111 mg/dL — ABNORMAL HIGH (ref 70–99)
Potassium: 3.2 mmol/L — ABNORMAL LOW (ref 3.5–5.1)
Sodium: 135 mmol/L (ref 135–145)

## 2022-03-18 LAB — CBC
HCT: 29.2 % — ABNORMAL LOW (ref 36.0–46.0)
Hemoglobin: 9.6 g/dL — ABNORMAL LOW (ref 12.0–15.0)
MCH: 31.4 pg (ref 26.0–34.0)
MCHC: 32.9 g/dL (ref 30.0–36.0)
MCV: 95.4 fL (ref 80.0–100.0)
Platelets: 240 10*3/uL (ref 150–400)
RBC: 3.06 MIL/uL — ABNORMAL LOW (ref 3.87–5.11)
RDW: 13.3 % (ref 11.5–15.5)
WBC: 10 10*3/uL (ref 4.0–10.5)
nRBC: 0 % (ref 0.0–0.2)

## 2022-03-18 MED ORDER — FOLIC ACID 1 MG PO TABS: 1.0000 mg | ORAL_TABLET | Freq: Every day | ORAL | 0 refills | Status: DC

## 2022-03-18 MED ORDER — POLYETHYLENE GLYCOL 3350 17 G PO PACK: 17.0000 g | PACK | Freq: Every day | ORAL | 0 refills | Status: DC

## 2022-03-18 MED ORDER — POLYETHYLENE GLYCOL 3350 17 G PO PACK
17.0000 g | PACK | Freq: Every day | ORAL | Status: DC
  Administered 2022-03-18: 17 g via ORAL
  Filled 2022-03-18: qty 1

## 2022-03-18 MED ORDER — THIAMINE HCL 100 MG PO TABS: 100.0000 mg | ORAL_TABLET | Freq: Every day | ORAL | 0 refills | Status: DC

## 2022-03-18 MED ORDER — PANTOPRAZOLE SODIUM 40 MG PO TBEC: 40.0000 mg | DELAYED_RELEASE_TABLET | Freq: Every day | ORAL | 0 refills | Status: DC

## 2022-03-18 MED ORDER — ADULT MULTIVITAMIN W/MINERALS CH: 1.0000 | ORAL_TABLET | Freq: Every day | ORAL | 0 refills | Status: DC

## 2022-03-18 MED ORDER — POTASSIUM CHLORIDE CRYS ER 20 MEQ PO TBCR
40.0000 meq | EXTENDED_RELEASE_TABLET | Freq: Once | ORAL | Status: AC
  Administered 2022-03-18: 40 meq via ORAL
  Filled 2022-03-18: qty 2

## 2022-03-18 MED ORDER — POTASSIUM CHLORIDE 10 MEQ/100ML IV SOLN
10.0000 meq | INTRAVENOUS | Status: AC
  Administered 2022-03-18 (×4): 10 meq via INTRAVENOUS
  Filled 2022-03-18 (×4): qty 100

## 2022-03-18 NOTE — Progress Notes (Signed)
[facility] info placed on AVS in the event patient would like to follow up with ETOH cessation.  ? ?Osborne Casco Jacob Cicero ?LCSW, MSW, MHA ? ?

## 2022-03-18 NOTE — Progress Notes (Signed)
?  Transition of Care (TOC) Screening Note ? ? ?Patient Details  ?Name: Audrey Schultz ?Date of Birth: 02/09/1991 ? ? ?Transition of Care (TOC) CM/SW Contact:    ?Harriet Masson, RN ?Phone Number: ?03/18/2022, 8:01 AM ? ? ? ?Transition of Care Department [facility]) has reviewed patient  We will continue to monitor patient advancement through interdisciplinary progression rounds.  ?Needs Substance Abuse Couseling/Education ?

## 2022-03-18 NOTE — Progress Notes (Signed)
?      ?                 PROGRESS NOTE ? ?      ?PATIENT DETAILS ?Name: Audrey Schultz ?Age: 31 y.o. ?Sex: female ?Date of Birth: 1991-11-03 ?Admit Date: 03/16/2022 ?Admitting Physician Norval Morton, MD ?PCP:Pcp, No ? ?Brief Summary: ?Patient is a 31 y.o.  female with history of EtOH/tobacco/marijuana use-who presented with 1 week history of abdominal pain-found to have acute alcoholic pancreatitis.  See below for further details. ? ? ?Significant events: ?4/15>> admit for abdominal pain-found to have acute pancreatitis ? ?Significant studies: ?4/15>> RUQ ultrasound: No gallstones ?4/15>> CT abdomen/pelvis:Acute uncomplicated pancreatitis.  No necrosis. ? ?Significant microbiology data: ?None ? ?Procedures: ?None ? ?Consults: ?None  ? ?Subjective: ?Completely awake and alert.  Abdominal pain is significantly improved. ? ?Objective: ?Vitals: ?Blood pressure 122/87, pulse (!) 101, temperature 98.6 ?F (37 ?C), temperature source Oral, resp. rate 18, height 5\' 4"  (1.626 m), weight 60.2 kg, SpO2 98 %.  ? ?Exam: ?Gen Exam:Alert awake-not in any distress ?HEENT:atraumatic, normocephalic ?Chest: B/L clear to auscultation anteriorly ?CVS:S1S2 regular ?Abdomen: Soft-hardly any epigastric tenderness today. ?Extremities:no edema ?Neurology: Non focal ?Skin: no rash  ? ?Pertinent Labs/Radiology: ? ?  Latest Ref Rng & Units 03/18/2022  ?  3:17 AM 03/17/2022  ?  1:10 AM 03/16/2022  ?  5:54 AM  ?CBC  ?WBC 4.0 - 10.5 K/uL 10.0   10.7   12.3    ?Hemoglobin 12.0 - 15.0 g/dL 9.6   11.0   12.7    ?Hematocrit 36.0 - 46.0 % 29.2   33.9   37.9    ?Platelets 150 - 400 K/uL 240   281   350    ?  ?Lab Results  ?Component Value Date  ? NA 135 03/18/2022  ? K 2.6 (LL) 03/18/2022  ? CL 107 03/18/2022  ? CO2 23 03/18/2022  ? ?  ? ? ?Assessment/Plan: ?Acute alcoholic pancreatitis: Clinically improved-advancing diet-continue supportive care and see how she does.   ? ?Alcohol withdrawal: Completely awake and alert-hardly any tremors.  On Ativan  per CIWA protocol. Last drink was either on 4/12 or 4/13 per patient.  ? ?Hypokalemia: Replete and recheck. ? ?Polysubstance abuse: Urine drug screen positive for amphetamines/opiates/cannabinoids-known history of EtOH use (1/5 of tequila daily).  Denies amphetamine use-but acknowledges using oxycodone/cannabinoids prior to hospitalization ? ?BMI: ?Estimated body mass index is 22.78 kg/m? as calculated from the following: ?  Height as of this encounter: 5\' 4"  (1.626 m). ?  Weight as of this encounter: 60.2 kg.  ? ?Code status: ?  Code Status: Full Code  ? ?DVT Prophylaxis: ?enoxaparin (LOVENOX) injection 40 mg Start: 03/16/22 1215 ?  ?Family Communication: Friend at bedside. ? ? ?Disposition Plan: ?Status is: Observation ?The patient will require care spanning > 2 midnights and should be moved to inpatient because: Improving-diet being advanced-K being repleted.  Home either later today or tomorrow morning depending on progress. ?  ?Planned Discharge Destination:Home ? ? ?Diet: ?Diet Order   ? ?       ?  Diet regular Room service appropriate? Yes; Fluid consistency: Thin  Diet effective now       ?  ? ?  ?  ? ?  ?  ? ? ?Antimicrobial agents: ?Anti-infectives (From admission, onward)  ? ? None  ? ?  ? ? ? ?MEDICATIONS: ?Scheduled Meds: ? enoxaparin (LOVENOX) injection  40 mg Subcutaneous Daily  ?  folic acid  1 mg Oral Daily  ? LORazepam  0-4 mg Intravenous Q6H  ? Followed by  ? LORazepam  0-4 mg Intravenous Q12H  ? multivitamin with minerals  1 tablet Oral Daily  ? nicotine  14 mg Transdermal Daily  ? pantoprazole (PROTONIX) IV  40 mg Intravenous Daily  ? sodium chloride flush  3 mL Intravenous Q12H  ? thiamine  100 mg Oral Daily  ? Or  ? thiamine  100 mg Intravenous Daily  ? ?Continuous Infusions: ? sodium chloride 75 mL/hr at 03/18/22 1022  ? ?PRN Meds:.acetaminophen **OR** acetaminophen, albuterol, HYDROmorphone (DILAUDID) injection, LORazepam **OR** LORazepam, ondansetron **OR** ondansetron (ZOFRAN) IV,  prochlorperazine ? ? ?I have personally reviewed following labs and imaging studies ? ?LABORATORY DATA: ?CBC: ?Recent Labs  ?Lab 03/16/22 ?[id number] 03/17/22 ?0110 03/18/22 ?[id number]  ?WBC 12.3* 10.7* 10.0  ?HGB 12.7 11.0* 9.6*  ?HCT 37.9 33.9* 29.2*  ?MCV 96.4 96.0 95.4  ?PLT 350 281 240  ? ? ? ?Basic Metabolic Panel: ?Recent Labs  ?Lab 03/16/22 ?[id number] 03/16/22 ?1220 03/17/22 ?0110 03/18/22 ?0317  ?NA 139  --  135 135  ?K 3.3*  --  3.0* 2.6*  ?CL 107  --  107 107  ?CO2 20*  --  22 23  ?GLUCOSE 141*  --  108* 93  ?BUN 8  --  <5* <5*  ?CREATININE 0.79  --  0.50 0.48  ?CALCIUM 9.2  --  8.2* 7.9*  ?MG  --  2.0  --   --   ?PHOS  --  3.5  --   --   ? ? ? ?GFR: ?Estimated Creatinine Clearance: 89.6 mL/min (by C-G formula based on SCr of 0.48 mg/dL). ? ?Liver Function Tests: ?Recent Labs  ?Lab 03/16/22 ?[id number] 03/17/22 ?0110 03/18/22 ?[id number]  ?AST 27 17 15   ?ALT 16 11 9   ?ALKPHOS 66 53 50  ?BILITOT 1.2 0.5 0.8  ?PROT 7.2 6.0* 5.1*  ?ALBUMIN 4.1 3.1* 2.6*  ? ? ?Recent Labs  ?Lab 03/16/22 ?[id number]  ?LIPASE 258*  ? ? ?No results for input(s): AMMONIA in the last 168 hours. ? ?Coagulation Profile: ?No results for input(s): INR, PROTIME in the last 168 hours. ? ?Cardiac Enzymes: ?No results for input(s): CKTOTAL, CKMB, CKMBINDEX, TROPONINI in the last 168 hours. ? ?BNP (last 3 results) ?No results for input(s): PROBNP in the last 8760 hours. ? ?Lipid Profile: ?Recent Labs  ?  03/16/22 ?1220  ?TRIG 52  ? ? ? ?Thyroid Function Tests: ?No results for input(s): TSH, T4TOTAL, FREET4, T3FREE, THYROIDAB in the last 72 hours. ? ?Anemia Panel: ?No results for input(s): VITAMINB12, FOLATE, FERRITIN, TIBC, IRON, RETICCTPCT in the last 72 hours. ? ?Urine analysis: ?   ?Component Value Date/Time  ? [place] YELLOW 03/16/2022 1244  ? APPEARANCEUR HAZY (A) 03/16/2022 1244  ? LABSPEC >1.046 (H) 03/16/2022 1244  ? PHURINE 6.0 03/16/2022 1244  ? [place] NEGATIVE 03/16/2022 1244  ? HGBUR SMALL (A) 03/16/2022 1244  ? [place] NEGATIVE 03/16/2022 1244  ?  KETONESUR 80 (A) 03/16/2022 1244  ? [place] NEGATIVE 03/16/2022 1244  ? UROBILINOGEN 0.2 11/16/2014 0607  ? NITRITE NEGATIVE 03/16/2022 1244  ? LEUKOCYTESUR NEGATIVE 03/16/2022 1244  ? ? ?Sepsis Labs: ?Lactic Acid, Venous ?   ?Component Value Date/Time  ? LATICACIDVEN 3.2 ([place]) 01/07/2020 0023  ? ? ?MICROBIOLOGY: ?No results found for this or any previous visit (from the past 240 hour(s)). ? ?RADIOLOGY STUDIES/RESULTS: ?No results found. ? ? LOS: 1 day  ? ?Oren Binet, MD  ?  [facility] ? ? ? ?To contact the attending provider between 7A-7P or the covering provider during after hours 7P-7A, please log into the web site [email or web address] and access using universal [place] password for that web site. If you do not have the password, please call the hospital operator. ? ?03/18/2022, 12:16 PM ? ? ? ?

## 2022-03-18 NOTE — Discharge Summary (Signed)
? ?PATIENT DETAILS ?Name: Audrey Schultz ?Age: 31 y.o. ?Sex: female ?Date of Birth: 04/14/1991 ?MRN: [id number]. ?Admitting Physician: Clydie Braunondell A Smith, MD ?PCP:Pcp, No ? ?Admit Date: 03/16/2022 ?Discharge date: 03/18/2022 ? ?Recommendations for Outpatient Follow-up:  ?Follow up with PCP in 1-2 weeks ?Please obtain CMP/CBC in one week ?Continue counseling regarding importance of avoiding further alcohol use. ? ?Admitted From:  ?Home ? ?Disposition: ?Home ?  ?Discharge Condition: ?good ? ?CODE STATUS: ?  Code Status: Full Code  ? ?Diet recommendation:  ?Diet Order   ? ?       ?  Diet regular Room service appropriate? Yes; Fluid consistency: Thin  Diet effective now       ?  ?  Diet general       ?  ? ?  ?  ? ?  ?  ? ?Brief Summary: ?Patient is a 31 y.o.  female with history of EtOH/tobacco/marijuana use-who presented with 1 week history of abdominal pain-found to have acute alcoholic pancreatitis.  See below for further details. ?  ? Significant events: ?4/15>> admit for abdominal pain-found to have acute pancreatitis ?  ?Significant studies: ?4/15>> RUQ ultrasound: No gallstones ?4/15>> CT abdomen/pelvis:Acute uncomplicated pancreatitis.  No necrosis. ?  ?Significant microbiology data: ?None ?  ?Procedures: ?None ?  ?Consults: ?None  ? ?Brief Hospital Course: ?Acute alcoholic pancreatitis: Managed with supportive care-initially kept n.p.o.-subsequently started on clear liquids and other supportive care.  Diet has been gradually advanced-she has tolerated advancement in her diet-abdominal pain is very mild/minimal-she is anxious to be discharged home today.  Have counseled her extensively regarding avoidance of further alcohol use.  I have asked her to stay on a soft/full liquid diet for another week before advancing further.   ?  ?Alcohol withdrawal: Completely awake and alert-hardly any tremors.  Was on Ativan per [facility]WA protocol. Last drink was either on 4/12 or 4/13 per patient.  Counseled extensively regarding  avoidance of further alcohol use. ?  ?Hypokalemia: Repleted prior to discharge. ?  ?Polysubstance abuse: Urine drug screen positive for amphetamines/opiates/cannabinoids-known history of EtOH use (1/5 of tequila daily).  Denies amphetamine use-but acknowledges using oxycodone/cannabinoids prior to hospitalization ?  ?BMI: ?Estimated body mass index is 22.78 kg/m? as calculated from the following: ?  Height as of this encounter: 5\' 4"  (1.626 m). ?  Weight as of this encounter: 60.2 kg.  ? ?Obesity: ?Estimated body mass index is 22.78 kg/m? as calculated from the following: ?  Height as of this encounter: 5\' 4"  (1.626 m). ?  Weight as of this encounter: 60.2 kg.  ? ?Discharge Diagnoses:  ?Principal Problem: ?  Acute pancreatitis ?Active Problems: ?  SIRS (systemic inflammatory response syndrome) (HCC) ?  Hypokalemia ?  Polysubstance abuse (HCC) ? ? ?Discharge Instructions: ? ?Activity:  ?As tolerated  ? ?Discharge Instructions   ? ? Call MD for:  persistant nausea and vomiting   Complete by: As directed ?  ? Call MD for:  severe uncontrolled pain   Complete by: As directed ?  ? Diet general   Complete by: As directed ?  ? Full liquid/soft diet for 1 additional week before advancing further.  ? Discharge instructions   Complete by: As directed ?  ? Follow with Primary MD  in 1-2 weeks ? ?Stay in a soft diet/full liquid diet for approximately 1 week.  Avoid fatty foods. ? ?Stop all further alcohol intake. ? ?Please get a complete blood count and chemistry panel checked by your Primary MD  at your next visit, and again as instructed by your Primary MD. ? ?Get Medicines reviewed and adjusted: ?Please take all your medications with you for your next visit with your Primary MD ? ?Laboratory/radiological data: ?Please request your Primary MD to go over all hospital tests and procedure/radiological results at the follow up, please ask your Primary MD to get all Hospital records sent to his/her office. ? ?In some cases, they  will be blood work, cultures and biopsy results pending at the time of your discharge. Please request that your primary care M.D. follows up on these results. ? ?Also Note the following: ?If you experience worsening of your admission symptoms, develop shortness of breath, life threatening emergency, suicidal or homicidal thoughts you must seek medical attention immediately by calling 911 or calling your MD immediately  if symptoms less severe. ? ?You must read complete instructions/literature along with all the possible adverse reactions/side effects for all the Medicines you take and that have been prescribed to you. Take any new Medicines after you have completely understood and accpet all the possible adverse reactions/side effects.  ? ?Do not drive when taking Pain medications or sleeping medications (Benzodaizepines) ? ?Do not take more than prescribed Pain, Sleep and Anxiety Medications. It is not advisable to combine anxiety,sleep and pain medications without talking with your primary care practitioner ? ?Special Instructions: If you have smoked or chewed Tobacco  in the last 2 yrs please stop smoking, stop any regular Alcohol  and or any Recreational drug use. ? ?Wear Seat belts while driving. ? ?Please note: ?You were cared for by a hospitalist during your hospital stay. Once you are discharged, your primary care physician will handle any further medical issues. Please note that NO REFILLS for any discharge medications will be authorized once you are discharged, as it is imperative that you return to your primary care physician (or establish a relationship with a primary care physician if you do not have one) for your post hospital discharge needs so that they can reassess your need for medications and monitor your lab values.  ? Increase activity slowly   Complete by: As directed ?  ? ?  ? ?Allergies as of 03/18/2022   ? ?   Reactions  ? Shellfish Allergy Anaphylaxis  ? ?  ? ?  ?Medication List  ?  ? ?TAKE  these medications   ? ?acetaminophen 325 MG tablet ?Commonly known as: TYLENOL ?Take 2 tablets (650 mg total) by mouth every 12 (twelve) hours. ?What changed:  ?when to take this ?reasons to take this ?  ?folic acid 1 MG tablet ?Commonly known as: FOLVITE ?Take 1 tablet (1 mg total) by mouth daily. ?Start taking on: March 19, 2022 ?  ?multivitamin with minerals Tabs tablet ?Take 1 tablet by mouth daily. ?Start taking on: March 19, 2022 ?  ?pantoprazole 40 MG tablet ?Commonly known as: Protonix ?Take 1 tablet (40 mg total) by mouth daily. ?  ?polyethylene glycol 17 g packet ?Commonly known as: MIRALAX / GLYCOLAX ?Take 17 g by mouth daily. ?  ?thiamine 100 MG tablet ?Take 1 tablet (100 mg total) by mouth daily. ?Start taking on: March 19, 2022 ?  ? ?  ? ? Follow-up Information   ? ? [facility]. Call.   ?Specialty: Urgent Care ?Why: As needed for assistance with alcohol cessation programs. ?Contact information: ?[address] ?[place] [address] ?[phone] ? ?  ?  ? ? Primary care practitioner Follow up  in 1 week(s).   ? ?  ?  ? ?  ?  ? ?  ? ?Allergies  ?Allergen Reactions  ? Shellfish Allergy Anaphylaxis  ? ? ? ?Other Procedures/Studies: ?CT ABDOMEN PELVIS W CONTRAST ? ?Result Date: 03/16/2022 ?CLINICAL DATA:  Acute pancreatitis. EXAM: CT ABDOMEN AND PELVIS WITH CONTRAST TECHNIQUE: Multidetector CT imaging of the abdomen and pelvis was performed using the standard protocol following bolus administration of intravenous contrast. RADIATION DOSE REDUCTION: This exam was performed according to the departmental dose-optimization program which includes automated exposure control, adjustment of the mA and/or kV according to patient size and/or use of iterative reconstruction technique. CONTRAST:  OMNIPAQUE IOHEXOL 300 MG/ML  SOLN COMPARISON:  None. FINDINGS: Lower chest: Clear lung bases. Hepatobiliary: Liver normal in size. Decreased liver attenuation consistent with hepatic  steatosis. No liver mass or focal lesion. Normal gallbladder. No bile duct dilation. Pancreas: Diffuse peripancreatic fluid and inflammation consistent with pancreatitis. No pancreatic mass or duct dilation.

## 2022-03-18 NOTE — Progress Notes (Signed)
Discharge instructions reviewed with pt.  ?Copy of instructions given to pt, pt informed her scripts were sent in to her pharmacy. Pt's ride on their way and will be here within a few minutes. Pt getting dressed and will call unit NS when dressed and ride at main entrance.  ?Pt packing her belongings to take with her.   ?

## 2022-09-26 ENCOUNTER — Emergency Department (HOSPITAL_COMMUNITY)
Admission: EM | Admit: 2022-09-26 | Discharge: 2022-09-27 | Discharge status: Against Medical Advice | Payer: Medicaid Other | Attending: Student | Admitting: Student

## 2022-09-26 ENCOUNTER — Emergency Department (HOSPITAL_COMMUNITY): Payer: Medicaid Other

## 2022-09-26 ENCOUNTER — Encounter (HOSPITAL_COMMUNITY): Payer: Self-pay | Admitting: Emergency Medicine

## 2022-09-26 ENCOUNTER — Other Ambulatory Visit: Payer: Self-pay

## 2022-09-26 DIAGNOSIS — Y92410 Unspecified street and highway as the place of occurrence of the external cause: Secondary | ICD-10-CM | POA: Insufficient documentation

## 2022-09-26 DIAGNOSIS — R519 Headache, unspecified: Secondary | ICD-10-CM | POA: Insufficient documentation

## 2022-09-26 DIAGNOSIS — Z5321 Procedure and treatment not carried out due to patient leaving prior to being seen by health care provider: Secondary | ICD-10-CM | POA: Diagnosis not present

## 2022-09-26 LAB — CBC WITH DIFFERENTIAL/PLATELET
Abs Immature Granulocytes: 0.02 10*3/uL (ref 0.00–0.07)
Basophils Absolute: 0 10*3/uL (ref 0.0–0.1)
Basophils Relative: 0 %
Eosinophils Absolute: 0.1 10*3/uL (ref 0.0–0.5)
Eosinophils Relative: 1 %
HCT: 35.4 % — ABNORMAL LOW (ref 36.0–46.0)
Hemoglobin: 11.3 g/dL — ABNORMAL LOW (ref 12.0–15.0)
Immature Granulocytes: 0 %
Lymphocytes Relative: 41 %
Lymphs Abs: 3 10*3/uL (ref 0.7–4.0)
MCH: 31.3 pg (ref 26.0–34.0)
MCHC: 31.9 g/dL (ref 30.0–36.0)
MCV: 98.1 fL (ref 80.0–100.0)
Monocytes Absolute: 0.3 10*3/uL (ref 0.1–1.0)
Monocytes Relative: 4 %
Neutro Abs: 4 10*3/uL (ref 1.7–7.7)
Neutrophils Relative %: 54 %
Platelets: 373 10*3/uL (ref 150–400)
RBC: 3.61 MIL/uL — ABNORMAL LOW (ref 3.87–5.11)
RDW: 13.1 % (ref 11.5–15.5)
WBC: 7.4 10*3/uL (ref 4.0–10.5)
nRBC: 0 % (ref 0.0–0.2)

## 2022-09-26 LAB — I-STAT BETA HCG BLOOD, ED (MC, WL, AP ONLY): I-stat hCG, quantitative: <5 m[IU]/mL (ref <5)

## 2022-09-26 LAB — BASIC METABOLIC PANEL
Anion gap: 14 (ref 5–15)
BUN: 7 mg/dL (ref 6–20)
CO2: 20 mmol/L — ABNORMAL LOW (ref 22–32)
Calcium: 9.4 mg/dL (ref 8.9–10.3)
Chloride: 109 mmol/L (ref 98–111)
Creatinine, Ser: 0.68 mg/dL (ref 0.44–1.00)
GFR, Estimated: >60 mL/min (ref >60)
Glucose, Bld: 133 mg/dL — ABNORMAL HIGH (ref 70–99)
Potassium: 3.1 mmol/L — ABNORMAL LOW (ref 3.5–5.1)
Sodium: 143 mmol/L (ref 135–145)
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
## 2022-09-26 NOTE — ED Notes (Signed)
Pt states she no longer wants to be seen  

## 2022-09-26 NOTE — ED Notes (Signed)
Pt now wants to be seen
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
## 2022-09-27 NOTE — ED Notes (Signed)
X2 no response for vitals recheck  

## 2022-10-25 ENCOUNTER — Other Ambulatory Visit: Payer: Self-pay

## 2022-10-25 ENCOUNTER — Emergency Department (HOSPITAL_COMMUNITY)
Admission: EM | Admit: 2022-10-25 | Discharge: 2022-10-25 | Disposition: A | Discharge status: Home / Self Care | Payer: Medicaid Other | Attending: Emergency Medicine | Admitting: Emergency Medicine

## 2022-10-25 ENCOUNTER — Encounter (HOSPITAL_COMMUNITY): Payer: Self-pay

## 2022-10-25 DIAGNOSIS — W260XXA Contact with knife, initial encounter: Secondary | ICD-10-CM | POA: Insufficient documentation

## 2022-10-25 DIAGNOSIS — Z23 Encounter for immunization: Secondary | ICD-10-CM | POA: Insufficient documentation

## 2022-10-25 DIAGNOSIS — S6991XA Unspecified injury of right wrist, hand and finger(s), initial encounter: Secondary | ICD-10-CM | POA: Diagnosis present

## 2022-10-25 DIAGNOSIS — S61210A Laceration without foreign body of right index finger without damage to nail, initial encounter: Secondary | ICD-10-CM | POA: Insufficient documentation

## 2022-10-25 DIAGNOSIS — S61219A Laceration without foreign body of unspecified finger without damage to nail, initial encounter: Secondary | ICD-10-CM

## 2022-10-25 MED ORDER — BACITRACIN ZINC 500 UNIT/GM EX OINT
TOPICAL_OINTMENT | Freq: Two times a day (BID) | CUTANEOUS | Status: DC
  Administered 2022-10-25: 1 via TOPICAL

## 2022-10-25 MED ORDER — TETANUS-DIPHTH-ACELL PERTUSSIS 5-2.5-18.5 LF-MCG/0.5 IM SUSY
0.5000 mL | PREFILLED_SYRINGE | Freq: Once | INTRAMUSCULAR | Status: AC
  Administered 2022-10-25: 0.5 mL via INTRAMUSCULAR
  Filled 2022-10-25: qty 0.5

## 2022-10-25 MED ORDER — BACITRACIN ZINC 500 UNIT/GM EX OINT: 1.0000 | TOPICAL_OINTMENT | Freq: Two times a day (BID) | CUTANEOUS | 0 refills | Status: DC

## 2022-10-25 MED ORDER — CEPHALEXIN 500 MG PO CAPS: 500.0000 mg | ORAL_CAPSULE | Freq: Four times a day (QID) | ORAL | 0 refills | Status: DC

## 2022-10-25 NOTE — ED Notes (Signed)
Bacitracin ointment and dressing applied to rt index finger.

## 2022-10-25 NOTE — Discharge Instructions (Signed)
We saw you in the ER for your WOUND. °Please read the instructions provided on wound care. °Keep the area clean and dry, apply bacitracin ointment daily and take the medications provided. °RETURN TO THE ER IF THERE IS INCREASED PAIN, REDNESS, PUS COMING OUT from the wound site.  °

## 2022-10-25 NOTE — ED Triage Notes (Signed)
Patient reports that she cut her right pointer finger with a knife while cutting up vegetables 2 days ago.

## 2022-10-26 NOTE — ED Provider Notes (Signed)
[facility]-EMERGENCY DEPT Provider Note   CSN: [id number] Arrival date & time: 10/25/22  1258     History  Chief Complaint  Patient presents with   Laceration    Audrey Schultz is a 31 y.o. female.  HPI     31 year old female comes in with chief complaint of laceration.  Patient cut her finger about 2 days ago while cutting vegetables.  She has been applying localized dressing, comes to the ER because she has had increased pain and wants to make sure that her finger is not infected.  Also wondering if he can repair the cut.  Patient does not have any significant medical history.  She is unsure about her tetanus.  Home Medications Prior to Admission medications   Medication Sig Start Date End Date Taking? Authorizing Provider  bacitracin ointment Apply 1 Application topically 2 (two) times daily. 10/25/22  Yes Tavonte Seybold, MD  cephALEXin (KEFLEX) 500 MG capsule Take 1 capsule (500 mg total) by mouth 4 (four) times daily. 10/25/22  Yes Derwood Kaplan, MD  acetaminophen (TYLENOL) 325 MG tablet Take 2 tablets (650 mg total) by mouth every 12 (twelve) hours. Patient taking differently: Take 650 mg by mouth every 12 (twelve) hours as needed for mild pain. 01/10/20   Montez Morita, PA-C  folic acid (FOLVITE) 1 MG tablet Take 1 tablet (1 mg total) by mouth daily. 03/19/22   Ghimire, Werner Lean, MD  Multiple Vitamin (MULTIVITAMIN WITH MINERALS) TABS tablet Take 1 tablet by mouth daily. 03/19/22   Ghimire, Werner Lean, MD  pantoprazole (PROTONIX) 40 MG tablet Take 1 tablet (40 mg total) by mouth daily. 03/18/22 03/18/23  Ghimire, Werner Lean, MD  polyethylene glycol (MIRALAX / GLYCOLAX) 17 g packet Take 17 g by mouth daily. 03/18/22   Ghimire, Werner Lean, MD  thiamine 100 MG tablet Take 1 tablet (100 mg total) by mouth daily. 03/19/22   Ghimire, Werner Lean, MD      Allergies    Shellfish allergy    Review of Systems   Review of Systems  Physical Exam Updated Vital  Signs BP 135/89   Pulse 94   Temp 98.4 F (36.9 C) (Oral)   Resp 16   Ht 5\' 3"  (1.6 m)   Wt 65.8 kg   LMP 09/26/2022 (Exact Date)   SpO2 100%   BMI 25.69 kg/m  Physical Exam Vitals and nursing note reviewed.  Constitutional:      Appearance: She is well-developed.  HENT:     Head: Atraumatic.  Cardiovascular:     Rate and Rhythm: Normal rate.  Pulmonary:     Effort: Pulmonary effort is normal.  Musculoskeletal:     Cervical back: Normal range of motion and neck supple.     Comments: Patient has a small laceration over the distal part of the index finger, about 1 cm in length.  Laceration is superficial.  Able to flex and extend over the IP joints.  Patient has no evidence of felon or paronychia.  Skin:    General: Skin is warm and dry.  Neurological:     Mental Status: She is alert and oriented to person, place, and time.          ED Results / Procedures / Treatments   Labs (all labs ordered are listed, but only abnormal results are displayed) Labs Reviewed - No data to display  EKG None  Radiology No results found.  Procedures Procedures    Medications Ordered in ED  Medications  Tdap (BOOSTRIX) injection 0.5 mL (0.5 mLs Intramuscular Given 10/25/22 1422)    ED Course/ Medical Decision Making/ A&P                           Medical Decision Making Risk OTC drugs. Prescription drug management.   31 year old female comes in with chief complaint of finger laceration. She is immunocompetent.  There is no evidence of infection.  Differential considered includes localized infection, felon, paronychia, tendon injury.  She is well outside of window of laceration repair with suture.  We will apply bacitracin ointment.  We will put her on Keflex to avoid infection.  Tetanus will be updated.  Return precautions discussed, patient will come back to the ER if she starts having increased swelling, pain, purulent drainage.  Final Clinical Impression(s) / ED  Diagnoses Final diagnoses:  Laceration of finger of right hand without foreign body without damage to nail, unspecified finger, initial encounter    Rx / DC Orders ED Discharge Orders          Ordered    bacitracin ointment  2 times daily        10/25/22 1448    cephALEXin (KEFLEX) 500 MG capsule  4 times daily        10/25/22 1448              Derwood Kaplan, MD 10/26/22 1538

## 2023-01-02 ENCOUNTER — Telehealth: Payer: Medicaid Other

## 2023-01-25 ENCOUNTER — Emergency Department (HOSPITAL_COMMUNITY): Payer: Medicaid Other

## 2023-01-25 ENCOUNTER — Emergency Department (HOSPITAL_COMMUNITY)
Admission: EM | Admit: 2023-01-25 | Discharge: 2023-01-26 | Disposition: A | Discharge status: Home / Self Care | Payer: Medicaid Other | Source: Home / Self Care | Attending: Emergency Medicine | Admitting: Emergency Medicine

## 2023-01-25 ENCOUNTER — Encounter (HOSPITAL_COMMUNITY): Payer: Self-pay | Admitting: Emergency Medicine

## 2023-01-25 ENCOUNTER — Other Ambulatory Visit: Payer: Self-pay

## 2023-01-25 DIAGNOSIS — R110 Nausea: Secondary | ICD-10-CM | POA: Insufficient documentation

## 2023-01-25 DIAGNOSIS — K852 Alcohol induced acute pancreatitis without necrosis or infection: Secondary | ICD-10-CM | POA: Insufficient documentation

## 2023-01-25 DIAGNOSIS — Z1152 Encounter for screening for COVID-19: Secondary | ICD-10-CM | POA: Insufficient documentation

## 2023-01-25 LAB — CBC WITH DIFFERENTIAL/PLATELET
Abs Immature Granulocytes: 0.01 10*3/uL (ref 0.00–0.07)
Basophils Absolute: 0 10*3/uL (ref 0.0–0.1)
Basophils Relative: 0 %
Eosinophils Absolute: 0 10*3/uL (ref 0.0–0.5)
Eosinophils Relative: 0 %
HCT: 34.9 % — ABNORMAL LOW (ref 36.0–46.0)
Hemoglobin: 11.5 g/dL — ABNORMAL LOW (ref 12.0–15.0)
Immature Granulocytes: 0 %
Lymphocytes Relative: 27 %
Lymphs Abs: 2 10*3/uL (ref 0.7–4.0)
MCH: 31 pg (ref 26.0–34.0)
MCHC: 33 g/dL (ref 30.0–36.0)
MCV: 94.1 fL (ref 80.0–100.0)
Monocytes Absolute: 0.2 10*3/uL (ref 0.1–1.0)
Monocytes Relative: 3 %
Neutro Abs: 5.2 10*3/uL (ref 1.7–7.7)
Neutrophils Relative %: 70 %
Platelets: 390 10*3/uL (ref 150–400)
RBC: 3.71 MIL/uL — ABNORMAL LOW (ref 3.87–5.11)
RDW: 14.5 % (ref 11.5–15.5)
WBC: 7.6 10*3/uL (ref 4.0–10.5)
nRBC: 0 % (ref 0.0–0.2)

## 2023-01-25 LAB — URINALYSIS, ROUTINE W REFLEX MICROSCOPIC
Glucose, UA: NEGATIVE mg/dL
Hgb urine dipstick: NEGATIVE
Ketones, ur: 80 mg/dL — AB
Nitrite: NEGATIVE
Protein, ur: 30 mg/dL — AB
Specific Gravity, Urine: 1.026 (ref 1.005–1.030)
pH: 5 (ref 5.0–8.0)

## 2023-01-25 LAB — I-STAT BETA HCG BLOOD, ED (MC, WL, AP ONLY): I-stat hCG, quantitative: <5 m[IU]/mL (ref <5)

## 2023-01-25 LAB — COMPREHENSIVE METABOLIC PANEL
ALT: 16 U/L (ref 0–44)
AST: 29 U/L (ref 15–41)
Albumin: 4.2 g/dL (ref 3.5–5.0)
Alkaline Phosphatase: 81 U/L (ref 38–126)
Anion gap: 12 (ref 5–15)
BUN: <5 mg/dL — ABNORMAL LOW (ref 6–20)
CO2: 23 mmol/L (ref 22–32)
Calcium: 9 mg/dL (ref 8.9–10.3)
Chloride: 100 mmol/L (ref 98–111)
Creatinine, Ser: 0.68 mg/dL (ref 0.44–1.00)
GFR, Estimated: >60 mL/min (ref >60)
Glucose, Bld: 108 mg/dL — ABNORMAL HIGH (ref 70–99)
Potassium: 3.7 mmol/L (ref 3.5–5.1)
Sodium: 135 mmol/L (ref 135–145)
Total Bilirubin: 0.7 mg/dL (ref 0.3–1.2)
Total Protein: 7.3 g/dL (ref 6.5–8.1)

## 2023-01-25 LAB — RESP PANEL BY RT-PCR (RSV, FLU A&B, COVID)  RVPGX2
Influenza A by PCR: NEGATIVE
Influenza B by PCR: NEGATIVE
Resp Syncytial Virus by PCR: NEGATIVE
SARS Coronavirus 2 by RT PCR: NEGATIVE

## 2023-01-25 LAB — TROPONIN I (HIGH SENSITIVITY): Troponin I (High Sensitivity): 3 ng/L (ref <18)

## 2023-01-25 LAB — LIPASE, BLOOD: Lipase: 41 U/L (ref 11–51)

## 2023-01-25 MED ORDER — ONDANSETRON HCL 4 MG/2ML IJ SOLN
4.0000 mg | Freq: Once | INTRAMUSCULAR | Status: AC
  Administered 2023-01-25: 4 mg via INTRAVENOUS
  Filled 2023-01-25: qty 2

## 2023-01-25 MED ORDER — HYDROMORPHONE HCL 1 MG/ML IJ SOLN
1.0000 mg | Freq: Once | INTRAMUSCULAR | Status: AC
  Administered 2023-01-25: 1 mg via INTRAVENOUS
  Filled 2023-01-25: qty 1

## 2023-01-25 MED ORDER — HYDROMORPHONE HCL 1 MG/ML IJ SOLN: 1.0000 mg | Freq: Once | INTRAMUSCULAR | Status: DC

## 2023-01-25 MED ORDER — SODIUM CHLORIDE 0.9 % IV BOLUS
1000.0000 mL | Freq: Once | INTRAVENOUS | Status: AC
  Administered 2023-01-25: 1000 mL via INTRAVENOUS

## 2023-01-25 MED ORDER — OMEPRAZOLE 20 MG PO CPDR: 20.0000 mg | DELAYED_RELEASE_CAPSULE | Freq: Every day | ORAL | 0 refills | Status: DC

## 2023-01-25 MED ORDER — ONDANSETRON 4 MG PO TBDP: 8.0000 mg | ORAL_TABLET | Freq: Once | ORAL | Status: DC

## 2023-01-25 MED ORDER — IOHEXOL 350 MG/ML SOLN
75.0000 mL | Freq: Once | INTRAVENOUS | Status: AC | PRN
  Administered 2023-01-25: 75 mL via INTRAVENOUS

## 2023-01-25 MED ORDER — HYDROMORPHONE HCL 1 MG/ML IJ SOLN
0.5000 mg | Freq: Once | INTRAMUSCULAR | Status: AC
  Administered 2023-01-25: 0.5 mg via INTRAVENOUS
  Filled 2023-01-25: qty 1

## 2023-01-25 MED ORDER — ONDANSETRON HCL 4 MG PO TABS: 4.0000 mg | ORAL_TABLET | Freq: Three times a day (TID) | ORAL | 0 refills | Status: DC | PRN

## 2023-01-25 MED ORDER — HYDROCODONE-ACETAMINOPHEN 5-325 MG PO TABS: 1.0000 | ORAL_TABLET | ORAL | 0 refills | Status: DC | PRN

## 2023-01-25 NOTE — ED Triage Notes (Addendum)
Pt c/o of abdominal pain x2 weeks that has progressively gotten worse. Pain radiated around to right side of back. Pt states that it feels like her pancreatitis that she had before. Endorses bright blood in stool x4 days. Denies blood thinners. Endorses n/v and chest pain.

## 2023-01-25 NOTE — Discharge Instructions (Signed)
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  We feel you are safe for discharge home but please use the medicine to help with pain, nausea, and the stomach acid.  Please follow-up with outpatient GI and your PCP.  Please rest and stay hydrated.  Please avoid alcohol.  If any symptoms change or worsen acutely, please return to the nearest emergency department.

## 2023-01-25 NOTE — ED Provider Notes (Signed)
[facility] Provider Note   CSN: [id number] Arrival date & time: 01/25/23  1914     History  Chief Complaint  Patient presents with   Abdominal Pain    Audrey Schultz is a 32 y.o. female.  The history is provided by the patient and medical records. No language interpreter was used.  Abdominal Pain Pain location:  Epigastric, RUQ and LUQ Pain quality: aching and cramping   Pain radiates to:  Chest Pain severity:  Moderate Onset quality:  Gradual Duration:  2 weeks Timing:  Constant Progression:  Waxing and waning Chronicity:  Recurrent Context: alcohol use   Relieved by:  Nothing Worsened by:  Eating Ineffective treatments:  None tried Associated symptoms: chest pain, diarrhea, nausea and vomiting   Associated symptoms: no chills, no constipation, no cough, no dysuria, no fatigue, no fever, no shortness of breath, no vaginal bleeding and no vaginal discharge   Risk factors: alcohol abuse        Home Medications Prior to Admission medications   Medication Sig Start Date End Date Taking? Authorizing Provider  acetaminophen (TYLENOL) 325 MG tablet Take 2 tablets (650 mg total) by mouth every 12 (twelve) hours. Patient taking differently: Take 650 mg by mouth every 12 (twelve) hours as needed for mild pain. 01/10/20   Ainsley Spinner, PA-C  bacitracin ointment Apply 1 Application topically 2 (two) times daily. 10/25/22   Varney Biles, MD  cephALEXin (KEFLEX) 500 MG capsule Take 1 capsule (500 mg total) by mouth 4 (four) times daily. 10/25/22   Varney Biles, MD  folic acid (FOLVITE) 1 MG tablet Take 1 tablet (1 mg total) by mouth daily. 03/19/22   Ghimire, Henreitta Leber, MD  Multiple Vitamin (MULTIVITAMIN WITH MINERALS) TABS tablet Take 1 tablet by mouth daily. 03/19/22   Ghimire, Henreitta Leber, MD  pantoprazole (PROTONIX) 40 MG tablet Take 1 tablet (40 mg total) by mouth daily. 03/18/22 03/18/23  Ghimire, Henreitta Leber, MD  polyethylene  glycol (MIRALAX / GLYCOLAX) 17 g packet Take 17 g by mouth daily. 03/18/22   Ghimire, Henreitta Leber, MD  thiamine 100 MG tablet Take 1 tablet (100 mg total) by mouth daily. 03/19/22   Ghimire, Henreitta Leber, MD      Allergies    Shellfish allergy    Review of Systems   Review of Systems  Constitutional:  Negative for chills, fatigue and fever.  HENT:  Negative for congestion.   Respiratory:  Negative for cough, chest tightness and shortness of breath.   Cardiovascular:  Positive for chest pain. Negative for palpitations.  Gastrointestinal:  Positive for abdominal pain, diarrhea, nausea and vomiting. Negative for abdominal distention and constipation.  Genitourinary:  Negative for dysuria, flank pain, vaginal bleeding and vaginal discharge.  Musculoskeletal:  Negative for back pain.  Skin:  Negative for rash.  Neurological:  Negative for light-headedness and headaches.  Psychiatric/Behavioral:  Negative for agitation.   All other systems reviewed and are negative.   Physical Exam Updated Vital Signs BP (!) 148/89 (BP Location: Right Arm)   Pulse 99   Temp 98.7 F (37.1 C)   Resp 20   Ht '5\' 3"'$  (1.6 m)   Wt 69.4 kg   SpO2 97%   BMI 27.10 kg/m  Physical Exam Vitals and nursing note reviewed.  Constitutional:      General: She is not in acute distress.    Appearance: She is well-developed. She is not ill-appearing, toxic-appearing or diaphoretic.  HENT:  Head: Normocephalic and atraumatic.     Nose: Nose normal. No congestion or rhinorrhea.     Mouth/Throat:     Mouth: Mucous membranes are dry.  Eyes:     Extraocular Movements: Extraocular movements intact.     Conjunctiva/sclera: Conjunctivae normal.     Pupils: Pupils are equal, round, and reactive to light.  Cardiovascular:     Rate and Rhythm: Normal rate and regular rhythm.     Heart sounds: No murmur heard. Pulmonary:     Effort: Pulmonary effort is normal. No respiratory distress.     Breath sounds: Normal breath  sounds. No wheezing, rhonchi or rales.  Chest:     Chest wall: No tenderness.  Abdominal:     General: Abdomen is flat.     Palpations: Abdomen is soft.     Tenderness: There is abdominal tenderness. There is no right CVA tenderness, left CVA tenderness, guarding or rebound.  Musculoskeletal:        General: No swelling or tenderness.     Cervical back: Neck supple. No tenderness.     Right lower leg: No edema.     Left lower leg: No edema.  Skin:    General: Skin is warm and dry.     Capillary Refill: Capillary refill takes less than 2 seconds.     Findings: No erythema or rash.  Neurological:     General: No focal deficit present.     Mental Status: She is alert.  Psychiatric:        Mood and Affect: Mood normal.     ED Results / Procedures / Treatments   Labs (all labs ordered are listed, but only abnormal results are displayed) Labs Reviewed  CBC WITH DIFFERENTIAL/PLATELET - Abnormal; Notable for the following components:      Result Value   RBC 3.71 (*)    Hemoglobin 11.5 (*)    HCT 34.9 (*)    All other components within normal limits  COMPREHENSIVE METABOLIC PANEL - Abnormal; Notable for the following components:   Glucose, Bld 108 (*)    BUN <5 (*)    All other components within normal limits  URINALYSIS, ROUTINE W REFLEX MICROSCOPIC - Abnormal; Notable for the following components:   Color, Urine AMBER (*)    APPearance CLOUDY (*)    Bilirubin Urine SMALL (*)    Ketones, ur 80 (*)    Protein, ur 30 (*)    Leukocytes,Ua SMALL (*)    Bacteria, UA FEW (*)    All other components within normal limits  RESP PANEL BY RT-PCR (RSV, FLU A&B, COVID)  RVPGX2  LIPASE, BLOOD  I-STAT BETA HCG BLOOD, ED ([facility], [facility], [facility] ONLY)  TROPONIN I (HIGH SENSITIVITY)  TROPONIN I (HIGH SENSITIVITY)    EKG EKG Interpretation  Date/Time:  Saturday January 25 2023 19:09:26 EST Ventricular Rate:  102 PR Interval:  142 QRS Duration: 74 QT Interval:  358 QTC Calculation: 466 R  Axis:   66 Text Interpretation: Sinus tachycardia Low voltage QRS Cannot rule out Anterior infarct , age undetermined Abnormal ECG No previous ECGs available no prior ECG for comparison. No STEMI Confirmed by Antony Blackbird [id number]) on 01/25/2023 11:28:06 PM  Radiology US Abdomen Limited RUQ (LIVER/GB)  Result Date: 01/25/2023 CLINICAL DATA:  Right upper quadrant pain. EXAM: ULTRASOUND ABDOMEN LIMITED RIGHT UPPER QUADRANT COMPARISON:  Right upper quadrant abdominal ultrasound 03/14/2022, CT abdomen and pelvis 01/25/2023 FINDINGS: Gallbladder: No gallstones or wall thickening visualized. No sonographic Murphy sign noted  by sonographer. Common bile duct: Diameter: 3 mm, within normal limits. No intrahepatic or extrahepatic biliary ductal dilatation. Liver: No focal lesion identified. Within normal limits in parenchymal echogenicity. Portal vein is patent on color Doppler imaging with normal direction of blood flow towards the liver. Other: None. IMPRESSION: Unremarkable right upper quadrant ultrasound. Electronically Signed   By: Yvonne Kendall M.D.   On: 01/25/2023 21:52   CT Abdomen Pelvis W Contrast  Result Date: 01/25/2023 CLINICAL DATA:  Epigastric pain, history of pancreatitis EXAM: CT ABDOMEN AND PELVIS WITH CONTRAST TECHNIQUE: Multidetector CT imaging of the abdomen and pelvis was performed using the standard protocol following bolus administration of intravenous contrast. RADIATION DOSE REDUCTION: This exam was performed according to the departmental dose-optimization program which includes automated exposure control, adjustment of the mA and/or kV according to patient size and/or use of iterative reconstruction technique. CONTRAST:  17m OMNIPAQUE IOHEXOL 350 MG/ML SOLN COMPARISON:  CT 03/16/2022 FINDINGS: Lower chest: No pleural or pericardial effusion. Visualized lung bases clear. Hepatobiliary: Focal fatty infiltrate adjacent to the falciform ligament. No other focal liver abnormality is seen. No  gallstones, gallbladder wall thickening, or biliary dilatation. Pancreas: The edema in the pancreatic body has resolved. There is mild edema around the pancreatic head and uncinate process, which is decreased in severity since previous. There is a small amount of peripancreatic fluid tracking inferior to the pancreatic head and liver margin. No well-formed pseudocyst. Symmetric enhancement pancreatic parenchyma with no evidence of pancreatic necrosis. No ductal dilatation. Portal vein and splenic vein remain patent. Spleen: Normal in size without focal abnormality. Adrenals/Urinary Tract: Adrenal glands are unremarkable. Kidneys are normal, without renal calculi, focal lesion, or hydronephrosis. Bladder is unremarkable. Stomach/Bowel: Stomach is partially distended by fluid and gas, unremarkable. Small bowel decompressed. Appendix not identified. No pericecal inflammatory change. Colon is partially distended by gas and fecal material, without acute finding. Vascular/Lymphatic: No significant vascular findings are present. No enlarged abdominal or pelvic lymph nodes. Reproductive: Uterus and bilateral adnexa are unremarkable. Other: Bilateral pelvic phleboliths. Small volume pelvic fluid, which can be physiologic in menstruating females. No abdominal ascites. Musculoskeletal: Right femoral IM rod with proximal interlocking screw, incompletely visualized. No acute findings. IMPRESSION: 1. Acute pancreatitis, with No pseudocyst nor evidence of pancreatic necrosis. 2. Small volume pelvic fluid, which can be physiologic in menstruating females. Electronically Signed   By: DLucrezia EuropeM.D.   On: 01/25/2023 21:18   DG Chest Portable 1 View  Result Date: 01/25/2023 CLINICAL DATA:  Chest pain EXAM: PORTABLE CHEST 1 VIEW COMPARISON:  09/26/2022 FINDINGS: The heart size and mediastinal contours are within normal limits. Both lungs are clear. The visualized skeletal structures are unremarkable. IMPRESSION: No active disease.  Electronically Signed   By: TPlacido SouM.D.   On: 01/25/2023 20:43    Procedures Procedures    Medications Ordered in ED Medications  HYDROmorphone (DILAUDID) injection 0.5 mg (has no administration in time range)  HYDROmorphone (DILAUDID) injection 1 mg (1 mg Intravenous Given 01/25/23 1946)  ondansetron (ZOFRAN) injection 4 mg (4 mg Intravenous Given 01/25/23 1946)  sodium chloride 0.9 % bolus 1,000 mL (0 mLs Intravenous Stopped 01/25/23 2300)  iohexol (OMNIPAQUE) 350 MG/ML injection 75 mL (75 mLs Intravenous Contrast Given 01/25/23 2110)    ED Course/ Medical Decision Making/ A&P                             Medical Decision Making Amount and/or Complexity of  Data Reviewed Radiology: ordered.  Risk Prescription drug management.    AMMI PAVLIK is a 32 y.o. female with a past medical history significant for polysubstance abuse, depression, and previous pancreatitis who presents with 2 weeks of abdominal pain, lower chest pain, nausea, vomiting, and diarrhea.  According to patient, she drank more heavily about 2 weeks ago and has continued to drink on and off.  She reports she is having pain that feels like previous pancreatitis but reports it goes to her epigastric area and wraps around her abdomen to the right side.  She reports no history of gallbladder troubles and is having pain that is quite severe.  She reports nausea and vomiting that have been persistent and is also having some orange appearing stools.  She denies any vaginal symptoms such as vaginal bleeding or vaginal discharge.  She initially thought symptoms were related to menstrual cramping but then her menstrual cycle came and went and she is still had the symptoms.  She denies trauma.  She denies any fevers, chills, congestion, cough.  Denies shortness of breath but does have some chest discomfort in her lower chest.  Denies history of cardiac disease.  She says this feels like her previous pancreatitis.  On  exam, lungs clear.  Abdomen is tender in the upper abdomen and right upper quadrant.  Lower abdomen nontender.  Bowel sounds were appreciated.  Intact sensation, strength, and pulse in extremities.  Mucous membranes are slightly dry.  No focal neurologic deficits initially.  Patient had nontender chest.  Normal breath sounds.  No murmur.  Given patient report this feels similar to how she felt when she had pancreatitis I do suspect this could be the pancreatitis or alcohol related gastritis.  However given the tenderness in her epigastric area and right upper quadrant we do feel it reasonable to get imaging including ultrasound and CT to rule out acute cholecystitis or other intra-abdominal pathology given her symptoms.  Will give her some fluids.  She had pain medicine and nausea medicine this helped her symptoms.  If workup is reassuring, anticipate this is likely more of a gastritis and she would likely be appropriate for Prilosec and GI outpatient follow-up.  Anticipate reassessment after workup.  Patient's workup showed no nitrites, with no urinary symptoms doubt UTI.  Viral testing negative.  She is not pregnant.  Troponin negative.  Metabolic panel showed no elevation in liver function or kidney function.  CBC shows mild anemia improved from prior and no leukocytosis.  Lipase was normal however CT scan shows evidence of acute pancreatitis.  Ultrasound reassuring with no gallbladder pathology.  Chest x-ray reassuring.  Suspect mild pancreatitis versus alcohol gastritis.  Patient is amenable to prescription with pain medicine, nausea medicine, and Prilosec and she will follow-up with outpatient GI and PCP.  She had no questions or concerns and was discharged in good condition.         Final Clinical Impression(s) / ED Diagnoses Final diagnoses:  Alcohol-induced acute pancreatitis without infection or necrosis  Nausea    Rx / DC Orders ED Discharge Orders          Ordered     HYDROcodone-acetaminophen (NORCO/VICODIN) 5-325 MG tablet  Every 4 hours PRN        01/25/23 2315    ondansetron (ZOFRAN) 4 MG tablet  Every 8 hours PRN        01/25/23 2315    omeprazole (PRILOSEC) 20 MG capsule  Daily  01/25/23 2315            Clinical Impression: 1. Alcohol-induced acute pancreatitis without infection or necrosis   2. Nausea     Disposition: Discharge  Condition: Good  I have discussed the results, Dx and Tx plan with the pt(& family if present). He/she/they expressed understanding and agree(s) with the plan. Discharge instructions discussed at great length. Strict return precautions discussed and pt &/or family have verbalized understanding of the instructions. No further questions at time of discharge.    New Prescriptions   HYDROCODONE-ACETAMINOPHEN (NORCO/VICODIN) 5-325 MG TABLET    Take 1 tablet by mouth every 4 (four) hours as needed.   OMEPRAZOLE (PRILOSEC) 20 MG CAPSULE    Take 1 capsule (20 mg total) by mouth daily.   ONDANSETRON (ZOFRAN) 4 MG TABLET    Take 1 tablet (4 mg total) by mouth every 8 (eight) hours as needed for nausea or vomiting.    Follow Up: Gastroenterology, [address] [place] [id number] [phone]     [place] [phone] Schedule an appointment as soon as possible for a visit    [facility] Emergency Department at [facility] [address] [place]       Adewale Pucillo, Gwenyth Allegra, MD 01/25/23 [phone]

## 2023-01-25 NOTE — ED Provider Triage Note (Signed)
Emergency Medicine Provider Triage Evaluation Note  Audrey Schultz , a 32 y.o. female  was evaluated in triage.  Pt complains of abdominal pain is gradually worse over the last 2 weeks.  Patient states she has had episodes of nonbloody emesis and is currently nauseous.  Patient states pain is epigastric but also lower abdomen that radiates around to right flank.  Patient states she has been unable to keep food or fluids down the past few days.  Patient also endorsed last 4 days having bright red blood per rectum and endorsed fatigue.  Patient stated leaning forward helps with her belly pain.  Patient states she also has substernal chest pain that is constant and is a pressure that does not radiate.  Patient denied shortness of breath, blood thinners, syncope  Review of Systems  Positive: See HPI Negative: See HPI  Physical Exam  BP (!) 148/89 (BP Location: Right Arm)   Pulse 99   Temp 98.7 F (37.1 C)   Resp 20   Ht '5\' 3"'$  (1.6 m)   Wt 69.4 kg   SpO2 97%   BMI 27.10 kg/m  Gen:   Awake, appears in pain Resp:  Normal effort  MSK:   Moves extremities without difficulty  Other:  Patient endorsed tenderness throughout abdomen however did not have any peritoneal signs, CVA tenderness negative, heart rate sounded tachycardic, lungs clear to auscultation bilaterally  Medical Decision Making  Medically screening exam initiated at 7:26 PM.  Appropriate orders placed.  Audrey Schultz was informed that the remainder of the evaluation will be completed by another provider, this initial triage assessment does not replace that evaluation, and the importance of remaining in the ED until their evaluation is complete.  Workup initiated, patient is stable at this time   Elvina Sidle 01/25/23 1932

## 2023-01-26 ENCOUNTER — Other Ambulatory Visit: Payer: Self-pay

## 2023-01-26 ENCOUNTER — Inpatient Hospital Stay (HOSPITAL_COMMUNITY)
Admission: EM | Admit: 2023-01-26 | Discharge: 2023-01-30 | DRG: 439 | Disposition: A | Discharge status: Home / Self Care | Payer: Medicaid Other | Attending: Family Medicine | Admitting: Family Medicine

## 2023-01-26 ENCOUNTER — Encounter (HOSPITAL_COMMUNITY): Payer: Self-pay

## 2023-01-26 DIAGNOSIS — F17210 Nicotine dependence, cigarettes, uncomplicated: Secondary | ICD-10-CM | POA: Diagnosis present

## 2023-01-26 DIAGNOSIS — Z87892 Personal history of anaphylaxis: Secondary | ICD-10-CM

## 2023-01-26 DIAGNOSIS — F1090 Alcohol use, unspecified, uncomplicated: Secondary | ICD-10-CM | POA: Diagnosis present

## 2023-01-26 DIAGNOSIS — E876 Hypokalemia: Secondary | ICD-10-CM | POA: Diagnosis present

## 2023-01-26 DIAGNOSIS — K852 Alcohol induced acute pancreatitis without necrosis or infection: Principal | ICD-10-CM | POA: Diagnosis present

## 2023-01-26 DIAGNOSIS — Z91013 Allergy to seafood: Secondary | ICD-10-CM

## 2023-01-26 DIAGNOSIS — R109 Unspecified abdominal pain: Secondary | ICD-10-CM | POA: Diagnosis not present

## 2023-01-26 DIAGNOSIS — K859 Acute pancreatitis without necrosis or infection, unspecified: Principal | ICD-10-CM | POA: Diagnosis present

## 2023-01-26 DIAGNOSIS — Z800 Family history of malignant neoplasm of digestive organs: Secondary | ICD-10-CM

## 2023-01-26 DIAGNOSIS — Z1152 Encounter for screening for COVID-19: Secondary | ICD-10-CM

## 2023-01-26 DIAGNOSIS — K861 Other chronic pancreatitis: Secondary | ICD-10-CM | POA: Diagnosis present

## 2023-01-26 DIAGNOSIS — Z79899 Other long term (current) drug therapy: Secondary | ICD-10-CM

## 2023-01-26 DIAGNOSIS — F1010 Alcohol abuse, uncomplicated: Secondary | ICD-10-CM | POA: Diagnosis present

## 2023-01-26 DIAGNOSIS — E871 Hypo-osmolality and hyponatremia: Secondary | ICD-10-CM

## 2023-01-26 LAB — TROPONIN I (HIGH SENSITIVITY): Troponin I (High Sensitivity): 3 ng/L (ref <18)

## 2023-01-26 MED ORDER — HYDROMORPHONE HCL 1 MG/ML IJ SOLN
1.0000 mg | INTRAMUSCULAR | Status: DC | PRN
  Administered 2023-01-26 – 2023-01-27 (×8): 1 mg via INTRAVENOUS
  Filled 2023-01-26 (×7): qty 1

## 2023-01-26 MED ORDER — HYDROMORPHONE HCL 1 MG/ML IJ SOLN
INTRAMUSCULAR | Status: AC
  Filled 2023-01-26: qty 1

## 2023-01-26 MED ORDER — MORPHINE SULFATE (PF) 2 MG/ML IV SOLN
2.0000 mg | INTRAVENOUS | Status: DC | PRN
  Administered 2023-01-26: 2 mg via INTRAVENOUS
  Filled 2023-01-26: qty 1

## 2023-01-26 MED ORDER — ONDANSETRON HCL 4 MG/5ML PO SOLN: 4.0000 mg | Freq: Four times a day (QID) | ORAL | Status: DC | PRN

## 2023-01-26 MED ORDER — LACTATED RINGERS IV SOLN: INTRAVENOUS | Status: DC

## 2023-01-26 MED ORDER — ONDANSETRON HCL 4 MG/2ML IJ SOLN
4.0000 mg | Freq: Four times a day (QID) | INTRAMUSCULAR | Status: DC | PRN
  Administered 2023-01-26 – 2023-01-29 (×6): 4 mg via INTRAVENOUS
  Filled 2023-01-26 (×6): qty 2

## 2023-01-26 MED ORDER — HYDROMORPHONE HCL 1 MG/ML IJ SOLN
0.5000 mg | Freq: Once | INTRAMUSCULAR | Status: AC
  Administered 2023-01-26: 0.5 mg via INTRAVENOUS
  Filled 2023-01-26: qty 1

## 2023-01-26 NOTE — ED Provider Notes (Signed)
[facility] Provider Note   CSN: [id number] Arrival date & time: 01/26/23  [id number]     History  No chief complaint on file.   Audrey Schultz is a 32 y.o. female history of pancreatitis, alcohol use, marijuana use, anemia.  Patient presents to the ER for abdominal pain nausea and vomiting.  Patient was discharged earlier this morning when she returned home her abdominal pain returned and she had multiple episodes of nonbloody/nonbilious emesis, she called EMS and was brought back to this facility.  She received IV pain medication and nausea medication prior to arrival.  She describes her abdominal pain is severe constant nonradiating worsens with palpation no alleviating factors, no radiation of pain.  She denies any alcohol or drug use after she left the ER this morning.  She denies fever or any additional concerns. HPI     Home Medications Prior to Admission medications   Medication Sig Start Date End Date Taking? Authorizing Provider  acetaminophen (TYLENOL) 325 MG tablet Take 2 tablets (650 mg total) by mouth every 12 (twelve) hours. Patient taking differently: Take 650 mg by mouth every 12 (twelve) hours as needed for mild pain. 01/10/20   Ainsley Spinner, PA-C  bacitracin ointment Apply 1 Application topically 2 (two) times daily. 10/25/22   Varney Biles, MD  cephALEXin (KEFLEX) 500 MG capsule Take 1 capsule (500 mg total) by mouth 4 (four) times daily. 10/25/22   Varney Biles, MD  folic acid (FOLVITE) 1 MG tablet Take 1 tablet (1 mg total) by mouth daily. 03/19/22   Ghimire, Henreitta Leber, MD  HYDROcodone-acetaminophen (NORCO/VICODIN) 5-325 MG tablet Take 1 tablet by mouth every 4 (four) hours as needed. 01/25/23   Tegeler, Gwenyth Allegra, MD  Multiple Vitamin (MULTIVITAMIN WITH MINERALS) TABS tablet Take 1 tablet by mouth daily. 03/19/22   Ghimire, Henreitta Leber, MD  omeprazole (PRILOSEC) 20 MG capsule Take 1 capsule (20 mg total) by mouth daily.  01/25/23   Tegeler, Gwenyth Allegra, MD  ondansetron (ZOFRAN) 4 MG tablet Take 1 tablet (4 mg total) by mouth every 8 (eight) hours as needed for nausea or vomiting. 01/25/23   Tegeler, Gwenyth Allegra, MD  pantoprazole (PROTONIX) 40 MG tablet Take 1 tablet (40 mg total) by mouth daily. 03/18/22 03/18/23  Ghimire, Henreitta Leber, MD  polyethylene glycol (MIRALAX / GLYCOLAX) 17 g packet Take 17 g by mouth daily. 03/18/22   Ghimire, Henreitta Leber, MD  thiamine 100 MG tablet Take 1 tablet (100 mg total) by mouth daily. 03/19/22   Ghimire, Henreitta Leber, MD      Allergies    Shellfish allergy    Review of Systems   Review of Systems Ten systems are reviewed and are negative for acute change except as noted in the HPI  Physical Exam Updated Vital Signs BP (!) 136/96 (BP Location: Right Arm)   Pulse 70   Temp 98.2 F (36.8 C) (Oral)   Resp 14   SpO2 100%  Physical Exam Constitutional:      General: She is not in acute distress.    Appearance: Normal appearance. She is well-developed. She is not ill-appearing or diaphoretic.  HENT:     Head: Normocephalic and atraumatic.  Eyes:     General: Vision grossly intact. Gaze aligned appropriately.     Pupils: Pupils are equal, round, and reactive to light.  Neck:     Trachea: Trachea and phonation normal.  Pulmonary:     Effort: Pulmonary effort is  normal. No respiratory distress.  Abdominal:     General: There is no distension.     Palpations: Abdomen is soft.     Tenderness: There is abdominal tenderness. There is no guarding or rebound.  Musculoskeletal:        General: Normal range of motion.     Cervical back: Normal range of motion.  Skin:    General: Skin is warm and dry.  Neurological:     Mental Status: She is alert.     GCS: GCS eye subscore is 4. GCS verbal subscore is 5. GCS motor subscore is 6.     Comments: Speech is clear and goal oriented, follows commands Major Cranial nerves without deficit, no facial droop Moves extremities without  ataxia, coordination intact  Psychiatric:        Behavior: Behavior normal.     ED Results / Procedures / Treatments   Labs (all labs ordered are listed, but only abnormal results are displayed) Labs Reviewed - No data to display  EKG None  Radiology US Abdomen Limited RUQ (LIVER/GB)  Result Date: 01/25/2023 CLINICAL DATA:  Right upper quadrant pain. EXAM: ULTRASOUND ABDOMEN LIMITED RIGHT UPPER QUADRANT COMPARISON:  Right upper quadrant abdominal ultrasound 03/14/2022, CT abdomen and pelvis 01/25/2023 FINDINGS: Gallbladder: No gallstones or wall thickening visualized. No sonographic Murphy sign noted by sonographer. Common bile duct: Diameter: 3 mm, within normal limits. No intrahepatic or extrahepatic biliary ductal dilatation. Liver: No focal lesion identified. Within normal limits in parenchymal echogenicity. Portal vein is patent on color Doppler imaging with normal direction of blood flow towards the liver. Other: None. IMPRESSION: Unremarkable right upper quadrant ultrasound. Electronically Signed   By: Yvonne Kendall M.D.   On: 01/25/2023 21:52   CT Abdomen Pelvis W Contrast  Result Date: 01/25/2023 CLINICAL DATA:  Epigastric pain, history of pancreatitis EXAM: CT ABDOMEN AND PELVIS WITH CONTRAST TECHNIQUE: Multidetector CT imaging of the abdomen and pelvis was performed using the standard protocol following bolus administration of intravenous contrast. RADIATION DOSE REDUCTION: This exam was performed according to the departmental dose-optimization program which includes automated exposure control, adjustment of the mA and/or kV according to patient size and/or use of iterative reconstruction technique. CONTRAST:  24m OMNIPAQUE IOHEXOL 350 MG/ML SOLN COMPARISON:  CT 03/16/2022 FINDINGS: Lower chest: No pleural or pericardial effusion. Visualized lung bases clear. Hepatobiliary: Focal fatty infiltrate adjacent to the falciform ligament. No other focal liver abnormality is seen. No  gallstones, gallbladder wall thickening, or biliary dilatation. Pancreas: The edema in the pancreatic body has resolved. There is mild edema around the pancreatic head and uncinate process, which is decreased in severity since previous. There is a small amount of peripancreatic fluid tracking inferior to the pancreatic head and liver margin. No well-formed pseudocyst. Symmetric enhancement pancreatic parenchyma with no evidence of pancreatic necrosis. No ductal dilatation. Portal vein and splenic vein remain patent. Spleen: Normal in size without focal abnormality. Adrenals/Urinary Tract: Adrenal glands are unremarkable. Kidneys are normal, without renal calculi, focal lesion, or hydronephrosis. Bladder is unremarkable. Stomach/Bowel: Stomach is partially distended by fluid and gas, unremarkable. Small bowel decompressed. Appendix not identified. No pericecal inflammatory change. Colon is partially distended by gas and fecal material, without acute finding. Vascular/Lymphatic: No significant vascular findings are present. No enlarged abdominal or pelvic lymph nodes. Reproductive: Uterus and bilateral adnexa are unremarkable. Other: Bilateral pelvic phleboliths. Small volume pelvic fluid, which can be physiologic in menstruating females. No abdominal ascites. Musculoskeletal: Right femoral IM rod with proximal interlocking screw,  incompletely visualized. No acute findings. IMPRESSION: 1. Acute pancreatitis, with No pseudocyst nor evidence of pancreatic necrosis. 2. Small volume pelvic fluid, which can be physiologic in menstruating females. Electronically Signed   By: Lucrezia Europe M.D.   On: 01/25/2023 21:18   DG Chest Portable 1 View  Result Date: 01/25/2023 CLINICAL DATA:  Chest pain EXAM: PORTABLE CHEST 1 VIEW COMPARISON:  09/26/2022 FINDINGS: The heart size and mediastinal contours are within normal limits. Both lungs are clear. The visualized skeletal structures are unremarkable. IMPRESSION: No active disease.  Electronically Signed   By: Placido Sou M.D.   On: 01/25/2023 20:43    Procedures Procedures    Medications Ordered in ED Medications  lactated ringers infusion ( Intravenous New Bag/Given 01/26/23 1255)  morphine (PF) 2 MG/ML injection 2 mg (2 mg Intravenous Given 01/26/23 1257)  ondansetron (ZOFRAN) injection 4 mg (4 mg Intravenous Given 01/26/23 1257)  HYDROmorphone (DILAUDID) injection 0.5 mg (0.5 mg Intravenous Given 01/26/23 1102)    ED Course/ Medical Decision Making/ A&P Clinical Course as of 01/26/23 1324  Sun Jan 26, 2023  1213 Dr. [facility] [BM]    Clinical Course User Index [BM] Deliah Boston, PA-C                             Medical Decision Making Amount and/or Complexity of Data Reviewed External Data Reviewed:     Details: Reviewed labs and imaging from last night.  Significant for CT abdomen pelvis which showed acute pancreatitis.  Abdominal right upper quadrant ultrasound which was negative.  Lipase which was within normal limits.  CBC and CMP were unremarkable.  She received IV pain medication and IV fluids.  She received multiple doses of Dilaudid.  She had cardiac workup as well as troponin which was negative. Suspicion was for mild pancreatitis versus alcoholic gastritis last night, she was discharged with pain medication nausea medication and Prilosec and was encouraged to follow-up outpatient with GI and PCP.  Risk Prescription drug management. Decision regarding hospitalization. Risk Details: I reassessed the patient, she received another dose of IV Dilaudid here and reports ongoing pain.  There is no indication for repeat labs or imaging at this time since labs are less than 24 hours old.  Her abdomen is soft she is diffusely tender but has no peritoneal signs and no evidence for acute abdomen.  I discussed options with patient today and shared decision making was made, she elected for admission to hospital for further management of her  symptoms.  All questions were answered.  Low suspicion for perforation or other emergent pathology at this time.  Vital signs stable.   Case discussed with Dr. Telford Nab who agrees with plan.  Discussed case with family medicine Dr. Herb Grays who accepted patient for admission.  Note: Portions of this report may have been transcribed using voice recognition software. Every effort was made to ensure accuracy; however, inadvertent computerized transcription errors may still be present.         Final Clinical Impression(s) / ED Diagnoses Final diagnoses:  Acute pancreatitis, unspecified complication status, unspecified pancreatitis type  Abdominal pain, unspecified abdominal location    Rx / DC Orders ED Discharge Orders     None         Gari Crown 01/26/23 1324    Isla Pence, MD 01/30/23 1610

## 2023-01-26 NOTE — ED Notes (Signed)
Attending physician requested dilaudid admin prior to transfer upstairs. Pyxis override for dilaudid witnessed by [employer]. See MAR for admin details.

## 2023-01-26 NOTE — H&P (Cosign Needed Addendum)
Hospital Admission History and Physical Service Pager: [phone]  Patient name: Audrey Schultz Medical record number: [id number] Date of Birth: Mar 09, 1991 Age: 32 y.o. Gender: female  Primary Care Provider: Pcp, No Consultants: None Code Status: FULL Preferred Emergency Contact:  Deland Pretty (Sister) [phone]  Chief Complaint: Abdominal pain   Assessment and Plan: Audrey Schultz is a 32 y.o. female presenting with abdominal pain . Differential for this patient's presentation of this includes acute on chronic pancreatitis, gastritis, GI bleed, constipation. Less likely constipation as there is not a large amount of stool on CT and small bowel is decompressed. Unlikely GI bleed as hgb is stable and no rectal bleeding at this point.   Alcohol use disorder Patient has history of polysubstance use including alcohol, tobacco, and marijuana. Alcohol use with ~ 5 shots of liquor per day according to patient for about 10 years. Last drink was a few days ago. Denies history of seizures with withdrawal.  - TOC for substance use counseling  - CIWA with ativan  - Discuss cessation with medication at discharge.  - 14 mcg nicotine patch per day   Abdominal pain Abdominal pain most likely secondary to acute on chronic pancreatitis. Pancreatitis likely secondary to recent alcohol use. RUQ  negative for gallstones, triglycerides in the past year have been wnl. Patient has family history of pancreatic cancer in mother. No evidence of Cx on CT. Does not seem to be MEN as there is no history of thyroid disease as well. Could also have an element of alcohol gastritis. Patient does not use significant amount of NSAIDs. Unlikely SBO or other pelvic pathology as would have been visible on CT. UA does not indicate UTI. Patient does not have dysuria.  - Admit to [facility], Dr. Nori Riis  - Continue IVF at 150 mL/hr  - Dilaudid 1 mg q 4 hours with q 2 hours prn (patient responds better to dilaudid compared to  morphine)  - Zofran 4 mg q 6 hrs prn  - IV protonix 40 mg BID  - NPO for 24 hours  - Continue to monitor  - AM CBC, CMP   FEN/GI: NPO  VTE Prophylaxis: lovenox  Disposition: Home   History of Present Illness:  Audrey Schultz is a 32 y.o. female presenting with abdominal pain.   Patient had 2 weeks of abdominal pain, lower chest pain, nausea, vomiting, and diarrhea. Patient reports drinking more heavily than normal 2 weeks ago and has continued to drink alcohol on and off. Patient says that her abdominal pain is similar to when she previously had pancreatitis. Says that it starts at her epigastric region and wraps around her abdomen to the right side. Denies history of gallbladder problems. Denies vaginal discharge different than normal. Denies trauma. Denies hematemesis, fevers, chills. Denies shortness of breath.  Drank more heavily a couple weeks ago. Drinks around 5 shots of liquor a day. Couple days ago was her last drink.  Patient reports that she uses NSAIDs as needed. Denies heavy use. -not heavy use-usually during period that was 1 week.  She was recently in the ED yesterday. CT abdomen pelvis was remarkable for acute pancreatitis and RUQ ultrasound was negative for gallstones or cholecystitis. She was discharged with pain medication, anti nausea, and protonix. Her pain remained uncontrolled at home, thus she came in.   In the ED, patient had stable vital signs. She was in 10/10 pain. She was given dilaudid in the ED for pain and started on IVF.  Review Of Systems: Per HPI with the following additions: abdominal pain  Pertinent Past Medical History: Hx of pancreatitis Remainder reviewed in history tab.   Pertinent Past Surgical History: No abdominal surgeries, surgery on right hip and left ankle  Remainder reviewed in history tab.   Pertinent Social History: Tobacco use: 3-4 cigarettes a day for around 10 years Alcohol use: 5 shots daily Other Substance use: Marijuana  use, No IVDU Lives with daughter (46 year old-someone taking care of her)  Pertinent Family History: Pancreatic cancer stage 3 in mother-passed away in 2019/03/10 > came back as stage IV lung cancer No hx of thyroid cancer Remainder reviewed in history tab.   Important Outpatient Medications: None Remainder reviewed in medication history.   Objective: BP 122/82   Pulse 70   Temp 98.2 F (36.8 C) (Oral)   Resp 14   SpO2 99%  Exam: General: Very uncomfortable, moaning, trying not to move  Eyes: No scleral icterus  ENTM: Dry mucous membranes  Cardiovascular: RRR, possible S4 gallop, cap refill < 2 seconds  Respiratory: Normal work of breathing, CTAB Gastrointestinal: Non distended, tender to light palpation of epigastrium and R side of abdomen with involuntary guarding in these respective regions. No rebound guarding, Soft throughout  Derm: No jaundice  Neuro: Alert and oriented x 4  Labs:  CBC BMET  Recent Labs  Lab 01/25/23 1924  WBC 7.6  HGB 11.5*  HCT 34.9*  PLT 390   Recent Labs  Lab 01/25/23 1924  NA 135  K 3.7  CL 100  CO2 23  BUN <5*  CREATININE 0.68  GLUCOSE 108*  CALCIUM 9.0    Pertinent additional labs Lipase wnl, troponin 2.  EKG: Tachycardia to 101, slightly prolonged repolarization, Qtc 466, no ischemic changes    Imaging Studies Performed:  DG Chest: No active disease.   CT Abdomen Pelvis w/ contrast:  1. Acute pancreatitis, with No pseudocyst nor evidence of pancreatic necrosis. 2. Small volume pelvic fluid, which can be physiologic in menstruating females.  US Abdomen Limited RUQ: Unremarkable right upper quadrant ultrasound. No gallstones or wall thickening visualized. No sonographic Murphy sign noted by sonographer.  Lowry Ram, MD 01/26/2023, 6:01 PM PGY-1, [facility] pager: [phone], text pages welcome Secure chat group [facility] Teaching Service   Upper Level Addendum:  I have  seen and evaluated this patient along with Dr. Gwendolyn Lima and reviewed the above note, making necessary revisions as appropriate.  I agree with the medical decision making and physical exam as noted above.  Gerrit Heck, MD PGY-2 [facility]

## 2023-01-26 NOTE — Assessment & Plan Note (Deleted)
Abdominal pain most likely secondary to acute on chronic pancreatitis.in the setting of alcohol use. RUQ negative for gallstones, triglycerides in the past year have been wnl. Patient has family history of pancreatic cancer in mother. Patient does not use significant amount of NSAIDs. - Continue IVF at 150 mL/hr  - Dilaudid 1 mg q 4 hours with q 2 hours prn (patient responds better to dilaudid compared to morphine)  - Zofran 4 mg q 6 hrs prn - IV protonix 40 mg BID  - NPO for 24 hours  - Continue to monitor  - AM CBC, CMP

## 2023-01-26 NOTE — Progress Notes (Signed)
FMTS Interim Progress Note  S: Patient assessed at bedside with Dr. Madison Hickman. Patient resting and awakened to voice. States she is having some pain and the medication helps for around 1.5-2 hours and then it comes back. States she wants to drink something but the ice chip have been helping. Denies N/V. States she may be having some symptoms of alcohol withdrawal as she feels jittery. Denies h/o withdrawal seizures. Confirmed last drink 2 days ago.  O: BP 131/85 (BP Location: Left Arm)   Pulse 68   Temp 98.1 F (36.7 C) (Oral)   Resp 18   SpO2 99%     A/P: Acute on chronic pancreatitis -Pain management: Dilaudid '1mg'$  q2h prn -NPO and continue fluids x 24 hours  Alcohol withdrawal CIWA 1. States some symptoms of withdrawal with tremor. Last drink around 48 hours ago. -CIWA q4h  Remainder of plan per day team.  Colletta Maryland, MD 01/26/2023, 9:33 PM PGY-1, [facility] Service pager [phone]

## 2023-01-26 NOTE — Assessment & Plan Note (Addendum)
Patient has history of polysubstance use including alcohol, tobacco, and marijuana. Alcohol use with ~ 5 shots of liquor per day according to patient for about 10 years. Last drink was a few days ago. Denies history of seizures with withdrawal.  - TOC for substance use counseling  - CIWA with ativan  - Discuss cessation with medication at discharge.  - 14 mcg nicotine patch per day

## 2023-01-26 NOTE — Plan of Care (Signed)

## 2023-01-26 NOTE — ED Triage Notes (Addendum)
Patient arrived by EMS with complaint of ongoing abdominal pain with nausea and vomiting. Patient seen yesterday for same and did not pick up meds at discharge. She was diagnosed with gastritis and pancreatitis.  Patient arrived with PIV and received zofran '4mg'$  IV and fentanyl 50 IV pta. EMS reports vomited x 2 enroute.  Patient alert and oriented, denies ETOH after discharge yesterday.

## 2023-01-26 NOTE — ED Notes (Signed)
ED TO INPATIENT HANDOFF REPORT  ED Nurse Name and Phone #: Newman Pies [phone]  S Name/Age/Gender Audrey Schultz 32 y.o. female Room/Bed: 045C/045C  Code Status   Code Status: Prior  Home/SNF/Other Home Patient oriented to: self, place, time, and situation Is this baseline? Yes   Triage Complete: Triage complete  Chief Complaint Acute on chronic pancreatitis ([place]) [K85.90, K86.1]  Triage Note Patient arrived by EMS with complaint of ongoing abdominal pain with nausea and vomiting. Patient seen yesterday for same and did not pick up meds at discharge. She was diagnosed with gastritis and pancreatitis.  Patient arrived with PIV and received zofran '4mg'$  IV and fentanyl 50 IV pta. EMS reports vomited x 2 enroute.  Patient alert and oriented, denies ETOH after discharge yesterday.   Allergies Allergies  Allergen Reactions   Shellfish Allergy Anaphylaxis    Level of Care/Admitting Diagnosis ED Disposition     ED Disposition  Admit   Condition  --   Comment  Hospital Area: [place] [id number]  Level of Care: Med-Surg [16]  May place patient in observation at [facility] or [place] if equivalent level of care is available:: No  Covid Evaluation: Asymptomatic - no recent exposure (last 10 days) testing not required  Diagnosis: [facility] on chronic pancreatitis [id number]  Admitting Physician: Gerrit Heck [id number]  Attending Physician: Dickie La [[id number]]          B Medical/Surgery History Past Medical History:  Diagnosis Date   Anemia    hx w/pregnancy   Dysmenorrhea    Medical history non-contributory    Nicotine dependence 01/10/2020   Vitamin D deficiency 01/10/2020   Past Surgical History:  Procedure Laterality Date   INTRAMEDULLARY (IM) NAIL INTERTROCHANTERIC Right 01/07/2020   Procedure: INTRAMEDULLARY (IM) NAIL INTERTROCHANTRIC;  Surgeon: Altamese Berwick, MD;  Location: [facility];  Service: Orthopedics;  Laterality: Right;    ORIF ANKLE FRACTURE Left 01/20/2020   Procedure: OPEN REDUCTION INTERNAL FIXATION (ORIF) ANKLE FRACTURE;  Surgeon: Altamese Galena, MD;  Location: [facility];  Service: Orthopedics;  Laterality: Left;     A IV Location/Drains/Wounds Patient Lines/Drains/Airways Status     Active Line/Drains/Airways     Name Placement date Placement time Site Days   Peripheral IV 01/26/23 20 G Left Antecubital 01/26/23  0945  Antecubital  less than 1            Intake/Output Last 24 hours No intake or output data in the 24 hours ending 01/26/23 1301  Labs/Imaging Results for orders placed or performed during the hospital encounter of 01/25/23 (from the past 48 hour(s))  CBC with Differential     Status: Abnormal   Collection Time: 01/25/23  7:24 PM  Result Value Ref Range   WBC 7.6 4.0 - 10.5 K/uL   RBC 3.71 (L) 3.87 - 5.11 MIL/uL   Hemoglobin 11.5 (L) 12.0 - 15.0 g/dL   HCT 34.9 (L) 36.0 - 46.0 %   MCV 94.1 80.0 - 100.0 fL   MCH 31.0 26.0 - 34.0 pg   MCHC 33.0 30.0 - 36.0 g/dL   RDW 14.5 11.5 - 15.5 %   Platelets 390 150 - 400 K/uL   nRBC 0.0 0.0 - 0.2 %   Neutrophils Relative % 70 %   Neutro Abs 5.2 1.7 - 7.7 K/uL   Lymphocytes Relative 27 %   Lymphs Abs 2.0 0.7 - 4.0 K/uL   Monocytes Relative 3 %   Monocytes Absolute 0.2 0.1 - 1.0  K/uL   Eosinophils Relative 0 %   Eosinophils Absolute 0.0 0.0 - 0.5 K/uL   Basophils Relative 0 %   Basophils Absolute 0.0 0.0 - 0.1 K/uL   Immature Granulocytes 0 %   Abs Immature Granulocytes 0.01 0.00 - 0.07 K/uL    Comment: Performed at [facility] [address] [place], [place] [address]  Comprehensive metabolic panel     Status: Abnormal   Collection Time: 01/25/23  7:24 PM  Result Value Ref Range   Sodium 135 135 - 145 mmol/L   Potassium 3.7 3.5 - 5.1 mmol/L    Comment: HEMOLYSIS AT THIS LEVEL MAY AFFECT RESULT   Chloride 100 98 - 111 mmol/L   CO2 23 22 - 32 mmol/L   Glucose, Bld 108 (H) 70 - 99 mg/dL    Comment: Glucose reference  range applies only to samples taken after fasting for at least 8 hours.   BUN <5 (L) 6 - 20 mg/dL   Creatinine, Ser 0.68 0.44 - 1.00 mg/dL   Calcium 9.0 8.9 - 10.3 mg/dL   Total Protein 7.3 6.5 - 8.1 g/dL   Albumin 4.2 3.5 - 5.0 g/dL   AST 29 15 - 41 U/L    Comment: HEMOLYSIS AT THIS LEVEL MAY AFFECT RESULT   ALT 16 0 - 44 U/L    Comment: HEMOLYSIS AT THIS LEVEL MAY AFFECT RESULT   Alkaline Phosphatase 81 38 - 126 U/L   Total Bilirubin 0.7 0.3 - 1.2 mg/dL    Comment: HEMOLYSIS AT THIS LEVEL MAY AFFECT RESULT   GFR, Estimated >60 >60 mL/min    Comment: (NOTE) Calculated using the CKD-EPI Creatinine Equation (2021)    Anion gap 12 5 - 15    Comment: Performed at [facility], [place] [address], [place], [place] [address]  Lipase, blood     Status: None   Collection Time: 01/25/23  7:24 PM  Result Value Ref Range   Lipase 41 11 - 51 U/L    Comment: Performed at [facility] [address] [place], [place] [address]  Resp panel by RT-PCR (RSV, Flu A&B, Covid) Anterior Nasal Swab     Status: None   Collection Time: 01/25/23  7:25 PM   Specimen: Anterior Nasal Swab  Result Value Ref Range   SARS Coronavirus 2 by RT PCR NEGATIVE NEGATIVE   Influenza A by PCR NEGATIVE NEGATIVE   Influenza B by PCR NEGATIVE NEGATIVE    Comment: (NOTE) The Xpert Xpress SARS-CoV-2/FLU/RSV plus assay is intended as an aid in the diagnosis of influenza from Nasopharyngeal swab specimens and should not be used as a sole basis for treatment. Nasal washings and aspirates are unacceptable for Xpert Xpress SARS-CoV-2/FLU/RSV testing.  Fact Sheet for Patients: EntrepreneurPulse.com.au  Fact Sheet for Healthcare Providers: IncredibleEmployment.be  This test is not yet approved or cleared by the [place] FDA and has been authorized for detection and/or diagnosis of SARS-CoV-2 by FDA under an Emergency Use Authorization (EUA). This EUA will remain in effect  (meaning this test can be used) for the duration of the COVID-19 declaration under Section 564(b)(1) of the Act, 21 U.S.C. section 360bbb-3(b)(1), unless the authorization is terminated or revoked.     Resp Syncytial Virus by PCR NEGATIVE NEGATIVE    Comment: (NOTE) Fact Sheet for Patients: EntrepreneurPulse.com.au  Fact Sheet for Healthcare Providers: IncredibleEmployment.be  This test is not yet approved or cleared by the [place] and has been authorized  for detection and/or diagnosis of SARS-CoV-2 by FDA under an Emergency Use Authorization (EUA). This EUA will remain in effect (meaning this test can be used) for the duration of the COVID-19 declaration under Section 564(b)(1) of the Act, 21 U.S.C. section 360bbb-3(b)(1), unless the authorization is terminated or revoked.  Performed at [facility], [place] [address], [place], [place] [address]   I-Stat beta hCG blood, ED     Status: None   Collection Time: 01/25/23  7:56 PM  Result Value Ref Range   I-stat hCG, quantitative <5.0 <5 mIU/mL   Comment 3            Comment:   GEST. AGE      CONC.  (mIU/mL)   <=1 WEEK        5 - 50     2 WEEKS       50 - 500     3 WEEKS       100 - 10,000     4 WEEKS     1,000 - 30,000        FEMALE AND NON-PREGNANT FEMALE:     LESS THAN 5 mIU/mL   Troponin I (High Sensitivity)     Status: None   Collection Time: 01/25/23  8:30 PM  Result Value Ref Range   Troponin I (High Sensitivity) 3 <18 ng/L    Comment: (NOTE) Elevated high sensitivity troponin I (hsTnI) values and significant  changes across serial measurements may suggest ACS but many other  chronic and acute conditions are known to elevate hsTnI results.  Refer to the "Links" section for chest pain algorithms and additional  guidance. Performed at [facility], [place] [address] [place], [place] [address]   Urinalysis, Routine w reflex microscopic -Urine, Catheterized      Status: Abnormal   Collection Time: 01/25/23  8:50 PM  Result Value Ref Range   Color, Urine AMBER (A) YELLOW    Comment: BIOCHEMICALS MAY BE AFFECTED BY COLOR   APPearance CLOUDY (A) CLEAR   Specific Gravity, Urine 1.026 1.005 - 1.030   pH 5.0 5.0 - 8.0   Glucose, UA NEGATIVE NEGATIVE mg/dL   Hgb urine dipstick NEGATIVE NEGATIVE   Bilirubin Urine SMALL (A) NEGATIVE   Ketones, ur 80 (A) NEGATIVE mg/dL   Protein, ur 30 (A) NEGATIVE mg/dL   Nitrite NEGATIVE NEGATIVE   Leukocytes,Ua SMALL (A) NEGATIVE   RBC / HPF 0-5 0 - 5 RBC/hpf   WBC, UA 11-20 0 - 5 WBC/hpf   Bacteria, UA FEW (A) NONE SEEN   Squamous Epithelial / HPF 11-20 0 - 5 /HPF   Mucus PRESENT     Comment: Performed at [facility], [address], [place], [place] [address]  Troponin I (High Sensitivity)     Status: None   Collection Time: 01/26/23 12:23 AM  Result Value Ref Range   Troponin I (High Sensitivity) 3 <18 ng/L    Comment: (NOTE) Elevated high sensitivity troponin I (hsTnI) values and significant  changes across serial measurements may suggest ACS but many other  chronic and acute conditions are known to elevate hsTnI results.  Refer to the "Links" section for chest pain algorithms and additional  guidance. Performed at [facility], [place] [address], [place], [place] [address]    US Abdomen Limited RUQ (LIVER/GB)  Result Date: 01/25/2023 CLINICAL DATA:  Right upper quadrant pain. EXAM: ULTRASOUND ABDOMEN LIMITED RIGHT UPPER QUADRANT COMPARISON:  Right upper  quadrant abdominal ultrasound 03/14/2022, CT abdomen and pelvis 01/25/2023 FINDINGS: Gallbladder: No gallstones or wall thickening visualized. No sonographic Murphy sign noted by sonographer. Common bile duct: Diameter: 3 mm, within normal limits. No intrahepatic or extrahepatic biliary ductal dilatation. Liver: No focal lesion identified. Within normal limits in parenchymal echogenicity. Portal vein is patent on color Doppler imaging with normal  direction of blood flow towards the liver. Other: None. IMPRESSION: Unremarkable right upper quadrant ultrasound. Electronically Signed   By: Yvonne Kendall M.D.   On: 01/25/2023 21:52   CT Abdomen Pelvis W Contrast  Result Date: 01/25/2023 CLINICAL DATA:  Epigastric pain, history of pancreatitis EXAM: CT ABDOMEN AND PELVIS WITH CONTRAST TECHNIQUE: Multidetector CT imaging of the abdomen and pelvis was performed using the standard protocol following bolus administration of intravenous contrast. RADIATION DOSE REDUCTION: This exam was performed according to the departmental dose-optimization program which includes automated exposure control, adjustment of the mA and/or kV according to patient size and/or use of iterative reconstruction technique. CONTRAST:  35m OMNIPAQUE IOHEXOL 350 MG/ML SOLN COMPARISON:  CT 03/16/2022 FINDINGS: Lower chest: No pleural or pericardial effusion. Visualized lung bases clear. Hepatobiliary: Focal fatty infiltrate adjacent to the falciform ligament. No other focal liver abnormality is seen. No gallstones, gallbladder wall thickening, or biliary dilatation. Pancreas: The edema in the pancreatic body has resolved. There is mild edema around the pancreatic head and uncinate process, which is decreased in severity since previous. There is a small amount of peripancreatic fluid tracking inferior to the pancreatic head and liver margin. No well-formed pseudocyst. Symmetric enhancement pancreatic parenchyma with no evidence of pancreatic necrosis. No ductal dilatation. Portal vein and splenic vein remain patent. Spleen: Normal in size without focal abnormality. Adrenals/Urinary Tract: Adrenal glands are unremarkable. Kidneys are normal, without renal calculi, focal lesion, or hydronephrosis. Bladder is unremarkable. Stomach/Bowel: Stomach is partially distended by fluid and gas, unremarkable. Small bowel decompressed. Appendix not identified. No pericecal inflammatory change. Colon is  partially distended by gas and fecal material, without acute finding. Vascular/Lymphatic: No significant vascular findings are present. No enlarged abdominal or pelvic lymph nodes. Reproductive: Uterus and bilateral adnexa are unremarkable. Other: Bilateral pelvic phleboliths. Small volume pelvic fluid, which can be physiologic in menstruating females. No abdominal ascites. Musculoskeletal: Right femoral IM rod with proximal interlocking screw, incompletely visualized. No acute findings. IMPRESSION: 1. Acute pancreatitis, with No pseudocyst nor evidence of pancreatic necrosis. 2. Small volume pelvic fluid, which can be physiologic in menstruating females. Electronically Signed   By: DLucrezia EuropeM.D.   On: 01/25/2023 21:18   DG Chest Portable 1 View  Result Date: 01/25/2023 CLINICAL DATA:  Chest pain EXAM: PORTABLE CHEST 1 VIEW COMPARISON:  09/26/2022 FINDINGS: The heart size and mediastinal contours are within normal limits. Both lungs are clear. The visualized skeletal structures are unremarkable. IMPRESSION: No active disease. Electronically Signed   By: TPlacido SouM.D.   On: 01/25/2023 20:43    Pending Labs Unresulted Labs (From admission, onward)    None       Vitals/Pain Today's Vitals   01/26/23 1126 01/26/23 1242 01/26/23 1254 01/26/23 1300  BP:    (!) 136/96  Pulse:    70  Resp:    14  Temp:  98.2 F (36.8 C)  98.2 F (36.8 C)  TempSrc:  Oral  Oral  SpO2:    100%  PainSc: Asleep  8      Isolation Precautions No active isolations  Medications Medications  lactated ringers infusion ( Intravenous  New Bag/Given 01/26/23 1255)  morphine (PF) 2 MG/ML injection 2 mg (2 mg Intravenous Given 01/26/23 1257)  ondansetron (ZOFRAN) injection 4 mg (4 mg Intravenous Given 01/26/23 1257)  HYDROmorphone (DILAUDID) injection 0.5 mg (0.5 mg Intravenous Given 01/26/23 1102)    Mobility walks     Focused Assessments Abdomen soft and tender to palpation. Acute on chronic  pancreatitis   R Recommendations: See Admitting Provider Note  Report given to:   Additional Notes: 20g IV in left [facility]

## 2023-01-27 DIAGNOSIS — K859 Acute pancreatitis without necrosis or infection, unspecified: Secondary | ICD-10-CM | POA: Diagnosis not present

## 2023-01-27 DIAGNOSIS — E871 Hypo-osmolality and hyponatremia: Secondary | ICD-10-CM | POA: Diagnosis not present

## 2023-01-27 DIAGNOSIS — Z1152 Encounter for screening for COVID-19: Secondary | ICD-10-CM | POA: Diagnosis not present

## 2023-01-27 DIAGNOSIS — K861 Other chronic pancreatitis: Secondary | ICD-10-CM

## 2023-01-27 DIAGNOSIS — E876 Hypokalemia: Secondary | ICD-10-CM | POA: Diagnosis not present

## 2023-01-27 DIAGNOSIS — K852 Alcohol induced acute pancreatitis without necrosis or infection: Secondary | ICD-10-CM | POA: Diagnosis not present

## 2023-01-27 DIAGNOSIS — F1010 Alcohol abuse, uncomplicated: Secondary | ICD-10-CM | POA: Diagnosis present

## 2023-01-27 DIAGNOSIS — F17210 Nicotine dependence, cigarettes, uncomplicated: Secondary | ICD-10-CM | POA: Diagnosis present

## 2023-01-27 DIAGNOSIS — Z800 Family history of malignant neoplasm of digestive organs: Secondary | ICD-10-CM | POA: Diagnosis not present

## 2023-01-27 DIAGNOSIS — Z79899 Other long term (current) drug therapy: Secondary | ICD-10-CM | POA: Diagnosis not present

## 2023-01-27 DIAGNOSIS — Z91013 Allergy to seafood: Secondary | ICD-10-CM | POA: Diagnosis not present

## 2023-01-27 DIAGNOSIS — R109 Unspecified abdominal pain: Secondary | ICD-10-CM | POA: Diagnosis not present

## 2023-01-27 DIAGNOSIS — Z87892 Personal history of anaphylaxis: Secondary | ICD-10-CM | POA: Diagnosis not present

## 2023-01-27 LAB — CBC
HCT: 33.5 % — ABNORMAL LOW (ref 36.0–46.0)
Hemoglobin: 11.2 g/dL — ABNORMAL LOW (ref 12.0–15.0)
MCH: 31.7 pg (ref 26.0–34.0)
MCHC: 33.4 g/dL (ref 30.0–36.0)
MCV: 94.9 fL (ref 80.0–100.0)
Platelets: 363 10*3/uL (ref 150–400)
RBC: 3.53 MIL/uL — ABNORMAL LOW (ref 3.87–5.11)
RDW: 14.6 % (ref 11.5–15.5)
WBC: 9.7 10*3/uL (ref 4.0–10.5)
nRBC: 0 % (ref 0.0–0.2)

## 2023-01-27 LAB — COMPREHENSIVE METABOLIC PANEL
ALT: 11 U/L (ref 0–44)
AST: 17 U/L (ref 15–41)
Albumin: 3.5 g/dL (ref 3.5–5.0)
Alkaline Phosphatase: 64 U/L (ref 38–126)
Anion gap: 11 (ref 5–15)
BUN: <5 mg/dL — ABNORMAL LOW (ref 6–20)
CO2: 27 mmol/L (ref 22–32)
Calcium: 9 mg/dL (ref 8.9–10.3)
Chloride: 96 mmol/L — ABNORMAL LOW (ref 98–111)
Creatinine, Ser: 0.61 mg/dL (ref 0.44–1.00)
GFR, Estimated: >60 mL/min (ref >60)
Glucose, Bld: 106 mg/dL — ABNORMAL HIGH (ref 70–99)
Potassium: 3.2 mmol/L — ABNORMAL LOW (ref 3.5–5.1)
Sodium: 134 mmol/L — ABNORMAL LOW (ref 135–145)
Total Bilirubin: 0.6 mg/dL (ref 0.3–1.2)
Total Protein: 6.3 g/dL — ABNORMAL LOW (ref 6.5–8.1)

## 2023-01-27 MED ORDER — ADULT MULTIVITAMIN W/MINERALS CH
1.0000 | ORAL_TABLET | Freq: Every day | ORAL | Status: DC
  Administered 2023-01-28 – 2023-01-30 (×2): 1 via ORAL
  Filled 2023-01-27 (×3): qty 1

## 2023-01-27 MED ORDER — POTASSIUM CHLORIDE 10 MEQ/100ML IV SOLN
10.0000 meq | INTRAVENOUS | Status: AC
  Administered 2023-01-27 (×4): 10 meq via INTRAVENOUS
  Filled 2023-01-27 (×4): qty 100

## 2023-01-27 MED ORDER — OXYCODONE HCL 5 MG PO TABS
5.0000 mg | ORAL_TABLET | ORAL | Status: DC | PRN
  Administered 2023-01-27 – 2023-01-28 (×5): 5 mg via ORAL
  Filled 2023-01-27 (×5): qty 1

## 2023-01-27 MED ORDER — PANTOPRAZOLE SODIUM 40 MG IV SOLR
40.0000 mg | Freq: Every day | INTRAVENOUS | Status: DC
  Administered 2023-01-27 – 2023-01-30 (×4): 40 mg via INTRAVENOUS
  Filled 2023-01-27 (×4): qty 10

## 2023-01-27 MED ORDER — ENOXAPARIN SODIUM 40 MG/0.4ML IJ SOSY
40.0000 mg | PREFILLED_SYRINGE | INTRAMUSCULAR | Status: DC
  Administered 2023-01-27 – 2023-01-30 (×2): 40 mg via SUBCUTANEOUS
  Filled 2023-01-27 (×4): qty 0.4

## 2023-01-27 MED ORDER — THIAMINE MONONITRATE 100 MG PO TABS
100.0000 mg | ORAL_TABLET | Freq: Every day | ORAL | Status: DC
  Administered 2023-01-28 – 2023-01-30 (×2): 100 mg via ORAL
  Filled 2023-01-27 (×3): qty 1

## 2023-01-27 MED ORDER — FOLIC ACID 1 MG PO TABS
1.0000 mg | ORAL_TABLET | Freq: Every day | ORAL | Status: DC
  Administered 2023-01-28 – 2023-01-30 (×2): 1 mg via ORAL
  Filled 2023-01-27 (×3): qty 1

## 2023-01-27 MED ORDER — HYDROMORPHONE HCL 1 MG/ML IJ SOLN
1.0000 mg | INTRAMUSCULAR | Status: DC | PRN
  Administered 2023-01-28: 1 mg via INTRAVENOUS
  Filled 2023-01-27: qty 1

## 2023-01-27 MED ORDER — NICOTINE 14 MG/24HR TD PT24
14.0000 mg | MEDICATED_PATCH | Freq: Every day | TRANSDERMAL | Status: DC
  Administered 2023-01-27 – 2023-01-29 (×3): 14 mg via TRANSDERMAL
  Filled 2023-01-27 (×4): qty 1

## 2023-01-27 MED ORDER — ACETAMINOPHEN 325 MG PO TABS
650.0000 mg | ORAL_TABLET | Freq: Four times a day (QID) | ORAL | Status: DC
  Administered 2023-01-27 – 2023-01-30 (×10): 650 mg via ORAL
  Filled 2023-01-27 (×12): qty 2

## 2023-01-27 MED ORDER — THIAMINE HCL 100 MG/ML IJ SOLN
100.0000 mg | Freq: Every day | INTRAMUSCULAR | Status: DC
  Administered 2023-01-27: 100 mg via INTRAVENOUS
  Filled 2023-01-27: qty 2

## 2023-01-27 NOTE — Assessment & Plan Note (Signed)
K 3.4 this AM. Replaced - CTM - f/u PM K, Mg

## 2023-01-27 NOTE — Progress Notes (Addendum)
Daily Progress Note Intern Pager: [phone]  Patient name: Audrey Schultz Medical record number: [id number] Date of birth: 1991-03-21 Age: 32 y.o. Gender: female  Primary Care Provider: Pcp, No Consultants: None Code Status: FULL   Pt Overview and Major Events to Date:  2/25 admitted  Assessment and Plan: Audrey Schultz is a 32 y.o. female presenting with abdominal pain likely secondary to acute on chronic pancreatitis in the setting of alcohol use.  * Acute on chronic pancreatitis (HCC) Abdominal pain most likely secondary to acute on chronic pancreatitis.in the setting of alcohol use. RUQ negative for gallstones, triglycerides in the past year have been wnl. Patient has family history of pancreatic cancer in mother. Patient does not use significant amount of NSAIDs. - Continue IVF at 150 mL/hr - Acetaminophen 650 mg oral q6h for pain - Dilaudid 1 mg IV q4h PRN for severe pain - Oxycodone 5 mg oral q4h PRN for moderate pain - Zofran 4 mg q 6 hrs prn - IV protonix 40 mg BID  - diet advance to clear liquids - Continue to monitor  - AM CBC, CMP   Alcohol use disorder Patient has history of polysubstance use including alcohol, tobacco, and marijuana. Alcohol use with ~ 5 shots of liquor per day according to patient for about 10 years. Last drink was Friday 2/23 - time of day unknown. CIWAs 0 > 0 > 0. Denies history of seizures with withdrawal. Has not previously tried MAT but interested. - TOC for substance use counseling  - CIWA with ativan  - Discuss cessation with medication at discharge.  - 14 mcg nicotine patch per day - consider starting MAT outpatient  Hypokalemia K 3.2 this AM. Replenished - CTM  Other chronic medical conditions: Smoking -14 mcg nicotine patch per day   FEN/GI: clear liquids VTE Prophylaxis: lovenox Disposition: Home pending medical improvement  Subjective:  Patient endorses abdominal pain 7/10. She feels she is not going through  withdrawal symptoms at this time. She denies shakes, hallucinations. She asks why she is on the heart monitor and wants to eat something, saying when she was here before they let her have a liquid diet and that it is over 24 hours. Her mouth and lips feel dry. Explained to patient rationale for NPO and need for cardiac monitoring. She is interested in MAT for alcohol use disorder - does not feel smoking cessation is an issue.  Objective: Temp:  [97.8 F (36.6 C)-98.2 F (36.8 C)] 97.8 F (36.6 C) (02/26 0919) Pulse Rate:  [68-93] 93 (02/26 0919) Resp:  [14-18] 15 (02/26 0919) BP: (118-142)/(77-103) 118/77 (02/26 0919) SpO2:  [97 %-100 %] 100 % (02/26 0919) Physical Exam: General: in no acute distress HEENT: normocephalic and atraumatic Respiratory: clear to auscultation bilaterally anteriorly, non-labored breathing, and on RA Extremities: moving all extremities spontaneously Gastrointestinal: non-distended and tender at baseline - only palpated minimally due to pain Cardiovascular: regular rate  Laboratory: Most recent CBC Lab Results  Component Value Date   WBC 9.7 01/27/2023   HGB 11.2 (L) 01/27/2023   HCT 33.5 (L) 01/27/2023   MCV 94.9 01/27/2023   PLT 363 01/27/2023   Most recent BMP    Latest Ref Rng & Units 01/27/2023    2:22 AM  BMP  Glucose 70 - 99 mg/dL 106   BUN 6 - 20 mg/dL <5   Creatinine 0.44 - 1.00 mg/dL 0.61   Sodium 135 - 145 mmol/L 134   Potassium 3.5 - 5.1 mmol/L 3.2  Chloride 98 - 111 mmol/L 96   CO2 22 - 32 mmol/L 27   Calcium 8.9 - 10.3 mg/dL 9.0     Camelia Phenes, MD 01/27/2023, 11:48 AM  PGY-1, [place] Intern pager: [phone], text pages welcome Secure chat group [place]

## 2023-01-27 NOTE — Assessment & Plan Note (Addendum)
Abdominal pain most likely secondary to acute on chronic pancreatitis.in the setting of alcohol use. RUQ negative for gallstones, triglycerides in the past year have been wnl. Patient has family history of pancreatic cancer in mother. Patient does not use significant amount of NSAIDs. - Decreased IVF from 150 to 100 mL/hr - Acetaminophen 650 mg oral q6h for pain - Dilaudid 1 mg IV q4h PRN for severe pain - Oxycodone 5 mg oral q4h PRN for moderate pain - Zofran 4 mg q 6 hrs prn - IV protonix 40 mg BID  - diet advance to full liquids - Continue to monitor  - AM CBC, CMP

## 2023-01-27 NOTE — Hospital Course (Signed)
DAKIA WOOLLEY is a 32 y.o. female presenting with abdominal pain likely secondary to acute on chronic pancreatitis in the setting of chronic alcohol use.  Acute on chronic pancreatitis  Patient with abdominal pain + imaging confirms acute on chronic pancreatitis. Lipase within normal limits. Pancreatitis likely secondary to chronic alcohol use. RUQ [place] negative for gallstones. Pancreatitis was treated with IV fluids. Pain was controlled on opioid and non-opioid medications during hospitalization. She was placed on NPO on admission and diet was gradually advanced over hospitalization. At time of discharge, she was eating regular foods and patient's pain was ***.  Alcohol use disorder Patient has history of alcohol use with ~ 5 shots of liquor per day according to patient for about 10 years. Last drink was reportedly Friday (2/23). During the hospitalization, patient was placed on CIWA. She expressed interest in medication assisted treatment (MAT) but not rehab. At time of discharge ***  Tobacco use disorder Smokes 3-4 cigarettes a day for around 10 years. Patient given nicotine patch while hospitalized  Issues for PCP follow-up: Offer MAT (naltrexone, acamprosate) for alcohol cravings Recheck BMP (Na, K)

## 2023-01-27 NOTE — Social Work (Signed)
CSW acknowledges consult for Substance Use Counseling and education. CSW met with pt at bedside, RN present as well. CSW attempted to engage pt, unsure if sleeping or unwilling to participate. CSW to attempt again as able.

## 2023-01-28 DIAGNOSIS — E871 Hypo-osmolality and hyponatremia: Secondary | ICD-10-CM

## 2023-01-28 DIAGNOSIS — K859 Acute pancreatitis without necrosis or infection, unspecified: Secondary | ICD-10-CM | POA: Diagnosis not present

## 2023-01-28 DIAGNOSIS — K861 Other chronic pancreatitis: Secondary | ICD-10-CM | POA: Diagnosis not present

## 2023-01-28 LAB — BASIC METABOLIC PANEL
Anion gap: 12 (ref 5–15)
BUN: <5 mg/dL — ABNORMAL LOW (ref 6–20)
CO2: 24 mmol/L (ref 22–32)
Calcium: 9.1 mg/dL (ref 8.9–10.3)
Chloride: 97 mmol/L — ABNORMAL LOW (ref 98–111)
Creatinine, Ser: 0.57 mg/dL (ref 0.44–1.00)
GFR, Estimated: >60 mL/min (ref >60)
Glucose, Bld: 86 mg/dL (ref 70–99)
Potassium: 3.4 mmol/L — ABNORMAL LOW (ref 3.5–5.1)
Sodium: 133 mmol/L — ABNORMAL LOW (ref 135–145)

## 2023-01-28 LAB — COMPREHENSIVE METABOLIC PANEL
ALT: 7 U/L (ref 0–44)
AST: 14 U/L — ABNORMAL LOW (ref 15–41)
Albumin: 3.1 g/dL — ABNORMAL LOW (ref 3.5–5.0)
Alkaline Phosphatase: 54 U/L (ref 38–126)
Anion gap: 12 (ref 5–15)
BUN: <5 mg/dL — ABNORMAL LOW (ref 6–20)
CO2: 25 mmol/L (ref 22–32)
Calcium: 8.5 mg/dL — ABNORMAL LOW (ref 8.9–10.3)
Chloride: 94 mmol/L — ABNORMAL LOW (ref 98–111)
Creatinine, Ser: 0.58 mg/dL (ref 0.44–1.00)
GFR, Estimated: >60 mL/min (ref >60)
Glucose, Bld: 90 mg/dL (ref 70–99)
Potassium: 2.8 mmol/L — ABNORMAL LOW (ref 3.5–5.1)
Sodium: 131 mmol/L — ABNORMAL LOW (ref 135–145)
Total Bilirubin: 0.5 mg/dL (ref 0.3–1.2)
Total Protein: 5.8 g/dL — ABNORMAL LOW (ref 6.5–8.1)

## 2023-01-28 LAB — CBC
HCT: 29.6 % — ABNORMAL LOW (ref 36.0–46.0)
Hemoglobin: 9.8 g/dL — ABNORMAL LOW (ref 12.0–15.0)
MCH: 31 pg (ref 26.0–34.0)
MCHC: 33.1 g/dL (ref 30.0–36.0)
MCV: 93.7 fL (ref 80.0–100.0)
Platelets: 304 10*3/uL (ref 150–400)
RBC: 3.16 MIL/uL — ABNORMAL LOW (ref 3.87–5.11)
RDW: 14 % (ref 11.5–15.5)
WBC: 7.4 10*3/uL (ref 4.0–10.5)
nRBC: 0 % (ref 0.0–0.2)

## 2023-01-28 LAB — MAGNESIUM: Magnesium: 1.8 mg/dL (ref 1.7–2.4)

## 2023-01-28 MED ORDER — POTASSIUM CHLORIDE CRYS ER 20 MEQ PO TBCR
20.0000 meq | EXTENDED_RELEASE_TABLET | Freq: Once | ORAL | Status: AC
  Administered 2023-01-28: 20 meq via ORAL
  Filled 2023-01-28: qty 1

## 2023-01-28 MED ORDER — HYDROMORPHONE HCL 1 MG/ML IJ SOLN
0.5000 mg | Freq: Three times a day (TID) | INTRAMUSCULAR | Status: DC | PRN
  Administered 2023-01-28 – 2023-01-30 (×5): 0.5 mg via INTRAVENOUS
  Filled 2023-01-28 (×6): qty 0.5

## 2023-01-28 MED ORDER — MAGNESIUM SULFATE IN D5W 1-5 GM/100ML-% IV SOLN
1.0000 g | Freq: Once | INTRAVENOUS | Status: AC
  Administered 2023-01-28: 1 g via INTRAVENOUS
  Filled 2023-01-28: qty 100

## 2023-01-28 MED ORDER — POTASSIUM CHLORIDE 20 MEQ PO PACK
40.0000 meq | PACK | Freq: Once | ORAL | Status: AC
  Administered 2023-01-28: 40 meq via ORAL
  Filled 2023-01-28: qty 2

## 2023-01-28 MED ORDER — OXYCODONE HCL 5 MG PO TABS
5.0000 mg | ORAL_TABLET | Freq: Four times a day (QID) | ORAL | Status: DC | PRN
  Administered 2023-01-28 – 2023-01-30 (×6): 5 mg via ORAL
  Filled 2023-01-28 (×6): qty 1

## 2023-01-28 NOTE — Discharge Instructions (Signed)
  Dear Idell Pickles,   Thank you so much for allowing [place] to be part of your care!  You were admitted to [facility] for pancreatitis from alcohol use. You were given fluids and pain medications   POST-HOSPITAL & CARE INSTRUCTIONS Abstain from drinking alcohol to prevent recurrences of this Please let PCP/Specialists know of any changes that were made.  Please see medications section of this packet for any medication changes.   DOCTOR'S APPOINTMENT & FOLLOW UP CARE INSTRUCTIONS  Future Appointments  Date Time Provider [place]  02/12/2023 11:10 AM Argentina Donovan, PA-C [facility] None    RETURN PRECAUTIONS: Fevers, worsening abdominal pain  Take care and be well!  [facility]  [place], [place] [address] [phone]                   Intensive Outpatient Programs  [place]    [facility] [address]     [place] [address] [place],  [place], [place]      [place]  (Inpatient and outpatient)  [phone] (Suboxone and Methadone) [address]           [phone]           [facility]: Alcohol & Drug Services    [facility] - Intensive Outpatient [address]     [address] [place] [phone] [place], [place] [address]     [place], [place]      [phone]  [facility] (Outpatient, Inpatient, [facility]  [facility] (Groups and [facility]) (insurance only) [phone]    [place], [place]       [facility]    [facility] (for caregivers and family) [address]     [address] [place], [place], [place]      [phone]  Residential Treatment Programs  [place]  [place](2 years) Residential: 77 days)  [facility]  ([place], [place], [place] [phone]      [phone] or [phone]  [facility] [place]    [facility] [address]      [place], [place], [place]      [phone]  [place]   [facility] Services (RTS) [place]     [address] [place], [place] [address]     [place], [place]      [phone] Admissions: 8am-3pm M-F  [facility] Program: Residential Program [phone] Days)              ADATC: [facility]  [place], [place], [place] or [phone]    (Walk in Hours over the weekend or by referral)   [place] Crisis: [facility]:[phone] (for crisis response 24 hours a day)

## 2023-01-28 NOTE — Assessment & Plan Note (Signed)
Na improved 131 > 133 - CTM

## 2023-01-28 NOTE — Progress Notes (Signed)
Daily Progress Note Intern Pager: [phone]  Patient name: Audrey Schultz Medical record number: [id number] Date of birth: [birth year] Age: 32 y.o. Gender: female  Primary Care Provider: Pcp, No Consultants: None Code Status: FULL    Pt Overview and Major Events to Date:  2/25 admitted   Assessment and Plan: LIOR BURBANK is a 32 y.o. female presenting with abdominal pain likely secondary to acute on chronic pancreatitis in the setting of alcohol use.   * Acute on chronic pancreatitis (HCC) Abdominal pain most likely secondary to acute on chronic pancreatitis.in the setting of alcohol use. RUQ negative for gallstones, triglycerides in the past year have been wnl. Patient has family history of pancreatic cancer in mother. Patient does not use significant amount of NSAIDs. - Decreased IVF from 150 to 100 mL/hr - Acetaminophen 650 mg oral q6h for pain - Dilaudid 1 mg IV q4h PRN for severe pain - Oxycodone 5 mg oral q4h PRN for moderate pain - Zofran 4 mg q 6 hrs prn - IV protonix 40 mg BID  - diet advance to full liquids - Continue to monitor  - AM CBC, CMP   Alcohol use disorder Patient has history of polysubstance use including alcohol, tobacco, and marijuana. Alcohol use with ~ 5 shots of liquor per day according to patient for about 10 years. Last drink was Friday 2/23 - time of day unknown. CIWAs 0 > 0 > 0. Denies history of seizures with withdrawal. Has not previously tried MAT but interested. - TOC for substance use counseling  - CIWA with ativan  - Discuss cessation with medication at discharge.  - 14 mcg nicotine patch per day - consider starting MAT outpatient  Hyponatremia Na 131. Likely dilutional secondary to IVF - CTM  Hypokalemia K 2.8 this AM. Replaced - CTM - f/u PM K, Mg  Other chronic medical conditions: Smoking -14 mcg nicotine patch per day   FEN/GI: full liquids VTE Prophylaxis: lovenox Disposition: Home pending medical  improvement  Subjective:  Patient denies abdominal pain at rest. Says she feels fine today. Reports last bowel movement on Friday, but did pass some gas this morning. She denies nausea, vomiting, or diarrhea.  Objective: Temp:  [97.4 F (36.3 C)-98.6 F (37 C)] 97.4 F (36.3 C) (02/27 0732) Pulse Rate:  [78-103] 79 (02/27 0732) Resp:  [16-18] 18 (02/27 0732) BP: (108-138)/(77-106) 108/77 (02/27 0732) SpO2:  [100 %] 100 % (02/27 0732) Physical Exam: General: in no acute distress and well-appearing HEENT: normocephalic and atraumatic Respiratory: clear to auscultation bilaterally posteriorly, non-labored breathing, and on RA Extremities: moving all extremities spontaneously Gastrointestinal:  tenderness to palpation 7-8/10 on LUQ and LLQ Cardiovascular: regular rate  Laboratory: Most recent CBC Lab Results  Component Value Date   WBC 7.4 01/28/2023   HGB 9.8 (L) 01/28/2023   HCT 29.6 (L) 01/28/2023   MCV 93.7 01/28/2023   PLT 304 01/28/2023   Most recent BMP    Latest Ref Rng & Units 01/28/2023    3:05 AM  BMP  Glucose 70 - 99 mg/dL 90   BUN 6 - 20 mg/dL <5   Creatinine 0.44 - 1.00 mg/dL 0.58   Sodium 135 - 145 mmol/L 131   Potassium 3.5 - 5.1 mmol/L 2.8   Chloride 98 - 111 mmol/L 94   CO2 22 - 32 mmol/L 25   Calcium 8.9 - 10.3 mg/dL 8.5     Camelia Phenes, MD 01/28/2023, 12:54 PM  PGY-1, [place]  [facility] FPTS Intern pager: [phone], text pages welcome Secure chat group [place]

## 2023-01-29 DIAGNOSIS — R109 Unspecified abdominal pain: Secondary | ICD-10-CM

## 2023-01-29 LAB — CBC
HCT: 31 % — ABNORMAL LOW (ref 36.0–46.0)
Hemoglobin: 10.6 g/dL — ABNORMAL LOW (ref 12.0–15.0)
MCH: 31.5 pg (ref 26.0–34.0)
MCHC: 34.2 g/dL (ref 30.0–36.0)
MCV: 92 fL (ref 80.0–100.0)
Platelets: 366 10*3/uL (ref 150–400)
RBC: 3.37 MIL/uL — ABNORMAL LOW (ref 3.87–5.11)
RDW: 14.1 % (ref 11.5–15.5)
WBC: 8 10*3/uL (ref 4.0–10.5)
nRBC: 0 % (ref 0.0–0.2)

## 2023-01-29 LAB — BASIC METABOLIC PANEL
Anion gap: 14 (ref 5–15)
BUN: <5 mg/dL — ABNORMAL LOW (ref 6–20)
CO2: 22 mmol/L (ref 22–32)
Calcium: 9.1 mg/dL (ref 8.9–10.3)
Chloride: 97 mmol/L — ABNORMAL LOW (ref 98–111)
Creatinine, Ser: 0.57 mg/dL (ref 0.44–1.00)
GFR, Estimated: >60 mL/min (ref >60)
Glucose, Bld: 97 mg/dL (ref 70–99)
Potassium: 3.2 mmol/L — ABNORMAL LOW (ref 3.5–5.1)
Sodium: 133 mmol/L — ABNORMAL LOW (ref 135–145)

## 2023-01-29 LAB — MAGNESIUM: Magnesium: 2.2 mg/dL (ref 1.7–2.4)

## 2023-01-29 MED ORDER — POTASSIUM CHLORIDE 20 MEQ PO PACK
40.0000 meq | PACK | Freq: Once | ORAL | Status: AC
  Administered 2023-01-29: 40 meq via ORAL
  Filled 2023-01-29: qty 2

## 2023-01-29 MED ORDER — POTASSIUM CHLORIDE CRYS ER 20 MEQ PO TBCR
20.0000 meq | EXTENDED_RELEASE_TABLET | Freq: Once | ORAL | Status: AC
  Administered 2023-01-29: 20 meq via ORAL
  Filled 2023-01-29: qty 1

## 2023-01-29 NOTE — Progress Notes (Signed)
Daily Progress Note Intern Pager: [phone]  Patient name: Audrey Schultz Medical record number: [id number] Date of birth: January 17, 1991 Age: 32 y.o. Gender: female  Primary Care Provider: Pcp, No Consultants: None Code Status: FULL     Pt Overview and Major Events to Date:  2/25 admitted   Assessment and Plan: Audrey Schultz is a 32 y.o. female presenting with abdominal pain likely secondary to acute on chronic pancreatitis in the setting of alcohol use.   * Acute on chronic pancreatitis (HCC) Abdominal pain most likely secondary to acute on chronic pancreatitis.in the setting of alcohol use. RUQ negative for gallstones, triglycerides in the past year have been wnl. Patient has family history of pancreatic cancer in mother. Patient does not use significant amount of NSAIDs. - Acetaminophen 650 mg oral q6h for pain - Dilaudid 1 mg IV q4h PRN for severe pain - Oxycodone 5 mg oral q4h PRN for moderate pain - Zofran 4 mg q 6 hrs prn - IV protonix 40 mg BID  - advance diet from soft to regular - Continue to monitor  - AM CBC, CMP   Alcohol use disorder Patient has history of polysubstance use including alcohol, tobacco, and marijuana. Alcohol use with ~ 5 shots of liquor per day according to patient for about 10 years. Last drink was Friday 2/23 - time of day unknown. CIWAs 0 > 0 > 0. Denies history of seizures with withdrawal. Has not previously tried MAT but interested. - TOC for substance use counseling  - CIWA with ativan  - Discuss cessation with medication at discharge.  - 14 mcg nicotine patch per day - consider starting MAT outpatient  Hyponatremia Na improved 131 > 133 - CTM  Hypokalemia K 3.4 this AM. Replaced - CTM - f/u PM K, Mg  Other chronic medical conditions: Smoking -14 mcg nicotine patch per day    FEN/GI: full liquids VTE Prophylaxis: lovenox Disposition: Home pending medical improvement  Subjective:  Patient says she has had no appetite and has  only been eating apple sauce.She says she enjoys taking a warm shower and it helps her stomach feel better, but otherwise denies abdominal pain at rest. Has not had a bowel movement. Denies vomiting but endorses some mild nausea this AM. I encouraged her to order some breakfast and try out softer foods to see if she tolerates - patient agreed.  Objective: Temp:  [98.4 F (36.9 C)-99.6 F (37.6 C)] 98.4 F (36.9 C) (02/28 0739) Pulse Rate:  [71-81] 81 (02/28 0739) Resp:  [16] 16 (02/28 0739) BP: (137-157)/(93-98) 157/98 (02/28 0739) SpO2:  [100 %] 100 % (02/28 0739) Physical Exam: General: in no acute distress and well-appearing HEENT: normocephalic and atraumatic Respiratory: clear to auscultation bilaterally anteriorly, non-labored breathing, and on RA Extremities: moving all extremities spontaneously Gastrointestinal:  mildly tender to palpation throughout all quadrants Cardiovascular: regular rate  Laboratory: Most recent CBC Lab Results  Component Value Date   WBC 8.0 01/29/2023   HGB 10.6 (L) 01/29/2023   HCT 31.0 (L) 01/29/2023   MCV 92.0 01/29/2023   PLT 366 01/29/2023   Most recent BMP    Latest Ref Rng & Units 01/29/2023   12:54 AM  BMP  Glucose 70 - 99 mg/dL 97   BUN 6 - 20 mg/dL <5   Creatinine 0.44 - 1.00 mg/dL 0.57   Sodium 135 - 145 mmol/L 133   Potassium 3.5 - 5.1 mmol/L 3.2   Chloride 98 - 111 mmol/L 97  CO2 22 - 32 mmol/L 22   Calcium 8.9 - 10.3 mg/dL 9.1    Camelia Phenes, MD 01/29/2023, 11:36 AM  PGY-1, [place] Intern pager: [phone], text pages welcome Secure chat group [place]

## 2023-01-29 NOTE — Discharge Summary (Signed)
[facility] Discharge Summary  Patient name: Audrey Schultz Medical record number: [id number] Date of birth: [birth year] Age: 32 y.o. Gender: female Date of Admission: 01/26/2023  Date of Discharge: 01/30/2023  Admitting Physician: Dickie La, MD  Primary Care Provider: Pcp, No Consultants: None  Indication for Hospitalization: acute on chronic pancreatitis  Brief Hospital Course:  Audrey Schultz is a 32 y.o. female presenting with abdominal pain likely secondary to acute on chronic pancreatitis in the setting of chronic alcohol use.  Acute on chronic pancreatitis  Patient with abdominal pain + imaging confirms acute on chronic pancreatitis. Lipase within normal limits. Pancreatitis likely secondary to chronic alcohol use. RUQ [place] negative for gallstones. Pancreatitis was treated with IV fluids. Pain was controlled on opioid and non-opioid medications during hospitalization. She was placed on NPO on admission and diet was gradually advanced over hospitalization. At time of discharge, she was back to eating regular foods (though with smaller bites) and in no pain.  Alcohol use disorder Patient has history of alcohol use with ~ 5 shots of liquor per day according to patient for about 10 years. Last drink was reportedly Friday (2/23). During the hospitalization, patient was placed on CIWA. She expressed interest in medication assisted treatment (MAT) but not rehab.  Tobacco use disorder Smokes 3-4 cigarettes a day for around 10 years. Patient given nicotine patch while hospitalized  Issues for PCP follow-up: Offer MAT (naltrexone, acamprosate) for alcohol cravings Recheck BMP (Na, K)  Discharge Diagnoses/Problem List:  * Acute on chronic pancreatitis Alcohol use disorder Hyponatremia Hypokalemia  Disposition: home  Discharge Condition: stable  Discharge Exam:  BP (!) 147/97 (BP Location: Right Arm)   Pulse 82   Temp 98.5 F (36.9 C) (Oral)    Resp 17   SpO2 100%  General: in no acute distress and well-appearing HEENT: normocephalic and atraumatic Respiratory: clear to auscultation bilaterally anteriorly, non-labored breathing, and on RA Extremities: moving all extremities spontaneously Gastrointestinal:  mildly tender to palpation in bilateral upper quadrants Cardiovascular: regular rate  Significant Labs and Imaging:  Recent Labs  Lab 01/29/23 0054 01/30/23 0240  WBC 8.0 6.7  HGB 10.6* 11.1*  HCT 31.0* 32.4*  PLT 366 381   Recent Labs  Lab 01/28/23 1537 01/29/23 0054 01/30/23 0240  NA 133* 133* 133*  K 3.4* 3.2* 3.7  CL 97* 97* 99  CO2 '24 22 26  '$ GLUCOSE 86 97 101*  BUN <5* <5* <5*  CREATININE 0.57 0.57 0.64  CALCIUM 9.1 9.1 9.4  MG 1.8 2.2  --    Discharge Medications:  Allergies as of 01/30/2023       Reactions   Shellfish Allergy Anaphylaxis        Medication List     STOP taking these medications    bacitracin ointment   cephALEXin 500 MG capsule Commonly known as: KEFLEX   pantoprazole 40 MG tablet Commonly known as: Protonix   polyethylene glycol 17 g packet Commonly known as: MIRALAX / GLYCOLAX       TAKE these medications    acetaminophen 325 MG tablet Commonly known as: TYLENOL Take 2 tablets (650 mg total) by mouth every 12 (twelve) hours. What changed:  when to take this reasons to take this   folic acid 1 MG tablet Commonly known as: FOLVITE Take 1 tablet (1 mg total) by mouth daily.   HYDROcodone-acetaminophen 5-325 MG tablet Commonly known as: NORCO/VICODIN Take 1 tablet by mouth every 4 (four) hours as  needed.   melatonin 3 MG Tabs tablet Take 1 tablet (3 mg total) by mouth at bedtime as needed.   multivitamin with minerals Tabs tablet Take 1 tablet by mouth daily.   omeprazole 20 MG capsule Commonly known as: PRILOSEC Take 1 capsule (20 mg total) by mouth daily.   ondansetron 4 MG tablet Commonly known as: ZOFRAN Take 1 tablet (4 mg total) by mouth  every 8 (eight) hours as needed for nausea or vomiting.   thiamine 100 MG tablet Commonly known as: VITAMIN B1 Take 1 tablet (100 mg total) by mouth daily.        Discharge Instructions: Please refer to Patient Instructions section of EMR for full details.  Patient was counseled important signs and symptoms that should prompt return to medical care, changes in medications, dietary instructions, activity restrictions, and follow up appointments.   Follow-Up Appointments:  Follow-up [facility] Follow up on 02/12/2023.   Why: post hospital follow up and to establish primary care scheduled for 02/12/2023 at 11:10 am with Freeman Caldron PA Contact information: [address] [place] [phone]                Camelia Phenes, MD 01/30/2023, 12:18 PM PGY-1, [place]

## 2023-01-30 LAB — CBC
HCT: 32.4 % — ABNORMAL LOW (ref 36.0–46.0)
Hemoglobin: 11.1 g/dL — ABNORMAL LOW (ref 12.0–15.0)
MCH: 31.5 pg (ref 26.0–34.0)
MCHC: 34.3 g/dL (ref 30.0–36.0)
MCV: 92 fL (ref 80.0–100.0)
Platelets: 381 10*3/uL (ref 150–400)
RBC: 3.52 MIL/uL — ABNORMAL LOW (ref 3.87–5.11)
RDW: 14.3 % (ref 11.5–15.5)
WBC: 6.7 10*3/uL (ref 4.0–10.5)
nRBC: 0 % (ref 0.0–0.2)

## 2023-01-30 LAB — BASIC METABOLIC PANEL
Anion gap: 8 (ref 5–15)
BUN: <5 mg/dL — ABNORMAL LOW (ref 6–20)
CO2: 26 mmol/L (ref 22–32)
Calcium: 9.4 mg/dL (ref 8.9–10.3)
Chloride: 99 mmol/L (ref 98–111)
Creatinine, Ser: 0.64 mg/dL (ref 0.44–1.00)
GFR, Estimated: >60 mL/min (ref >60)
Glucose, Bld: 101 mg/dL — ABNORMAL HIGH (ref 70–99)
Potassium: 3.7 mmol/L (ref 3.5–5.1)
Sodium: 133 mmol/L — ABNORMAL LOW (ref 135–145)

## 2023-01-30 MED ORDER — MELATONIN 3 MG PO TABS
3.0000 mg | ORAL_TABLET | Freq: Every evening | ORAL | Status: DC | PRN
  Administered 2023-01-30: 3 mg via ORAL
  Filled 2023-01-30: qty 1

## 2023-01-30 MED ORDER — MELATONIN 3 MG PO TABS: 3.0000 mg | ORAL_TABLET | Freq: Every evening | ORAL | 0 refills | Status: DC | PRN

## 2023-01-30 NOTE — Progress Notes (Signed)
   01/30/23 1100  Mobility  Activity Ambulated independently in hallway  Level of Assistance Independent  Assistive Device None  Distance Ambulated (ft) 30 ft  Activity Response Tolerated well  Mobility Referral Yes  $Mobility charge 1 Mobility   Mobility Specialist Progress Note  Pt in room and agreeable. Had no c/o pain. Returned to bed w/ all needs met and call bell in reach.   Lucious Groves Mobility Specialist  Please contact via SecureChat or Rehab office at [phone]

## 2023-01-30 NOTE — Progress Notes (Signed)
Instructions given to patient for discharge.  Patient instructed to seek assistance for drinking cessation.  Patient verbalized instructions, denies additional needs.  Pt ok to walk to main floor for discharge.  Sree[place], RN

## 2023-02-12 ENCOUNTER — Inpatient Hospital Stay: Payer: Self-pay | Admitting: Physician Assistant

## 2023-02-12 NOTE — Progress Notes (Deleted)
Patient ID: Audrey Schultz, female   DOB: 06-Apr-1991, 32 y.o.   MRN: [id number]   After hospitalization 2/25-2/29/2024  [facility] Course:  Audrey Schultz is a 32 y.o. female presenting with abdominal pain likely secondary to acute on chronic pancreatitis in the setting of chronic alcohol use.   Acute on chronic pancreatitis  Patient with abdominal pain + imaging confirms acute on chronic pancreatitis. Lipase within normal limits. Pancreatitis likely secondary to chronic alcohol use. RUQ [place] negative for gallstones. Pancreatitis was treated with IV fluids. Pain was controlled on opioid and non-opioid medications during hospitalization. She was placed on NPO on admission and diet was gradually advanced over hospitalization. At time of discharge, she was back to eating regular foods (though with smaller bites) and in no pain.   Alcohol use disorder Patient has history of alcohol use with ~ 5 shots of liquor per day according to patient for about 10 years. Last drink was reportedly Friday (2/23). During the hospitalization, patient was placed on CIWA. She expressed interest in medication assisted treatment (MAT) but not rehab.   Tobacco use disorder Smokes 3-4 cigarettes a day for around 10 years. Patient given nicotine patch while hospitalized   Issues for PCP follow-up: Offer MAT (naltrexone, acamprosate) for alcohol cravings Recheck BMP (Na, K)   Discharge Diagnoses/Problem List:  * Acute on chronic pancreatitis Alcohol use disorder Hyponatremia Hypokalemia

## 2023-09-11 ENCOUNTER — Encounter: Payer: Medicaid Other | Admitting: Obstetrics and Gynecology

## 2023-09-12 ENCOUNTER — Ambulatory Visit: Payer: Medicaid Other | Admitting: Family Medicine

## 2023-10-07 ENCOUNTER — Encounter (HOSPITAL_COMMUNITY): Payer: Self-pay | Admitting: Emergency Medicine

## 2023-10-07 ENCOUNTER — Other Ambulatory Visit: Payer: Self-pay

## 2023-10-07 ENCOUNTER — Emergency Department (HOSPITAL_COMMUNITY)
Admission: EM | Admit: 2023-10-07 | Discharge: 2023-10-07 | Discharge status: Against Medical Advice | Payer: Medicaid Other | Attending: Emergency Medicine | Admitting: Emergency Medicine

## 2023-10-07 DIAGNOSIS — R112 Nausea with vomiting, unspecified: Secondary | ICD-10-CM | POA: Diagnosis not present

## 2023-10-07 DIAGNOSIS — R109 Unspecified abdominal pain: Secondary | ICD-10-CM | POA: Diagnosis present

## 2023-10-07 DIAGNOSIS — Z5321 Procedure and treatment not carried out due to patient leaving prior to being seen by health care provider: Secondary | ICD-10-CM | POA: Diagnosis not present

## 2023-10-07 LAB — COMPREHENSIVE METABOLIC PANEL
ALT: 12 U/L (ref 0–44)
AST: 22 U/L (ref 15–41)
Albumin: 5 g/dL (ref 3.5–5.0)
Alkaline Phosphatase: 86 U/L (ref 38–126)
Anion gap: 12 (ref 5–15)
BUN: 8 mg/dL (ref 6–20)
CO2: 22 mmol/L (ref 22–32)
Calcium: 9.8 mg/dL (ref 8.9–10.3)
Chloride: 101 mmol/L (ref 98–111)
Creatinine, Ser: 0.77 mg/dL (ref 0.44–1.00)
GFR, Estimated: >60 mL/min (ref >60)
Glucose, Bld: 118 mg/dL — ABNORMAL HIGH (ref 70–99)
Potassium: 3.2 mmol/L — ABNORMAL LOW (ref 3.5–5.1)
Sodium: 135 mmol/L (ref 135–145)
Total Bilirubin: 0.6 mg/dL (ref <1.2)
Total Protein: 9 g/dL — ABNORMAL HIGH (ref 6.5–8.1)

## 2023-10-07 LAB — CBC
HCT: 40.5 % (ref 36.0–46.0)
Hemoglobin: 13 g/dL (ref 12.0–15.0)
MCH: 31.9 pg (ref 26.0–34.0)
MCHC: 32.1 g/dL (ref 30.0–36.0)
MCV: 99.3 fL (ref 80.0–100.0)
Platelets: 250 10*3/uL (ref 150–400)
RBC: 4.08 MIL/uL (ref 3.87–5.11)
RDW: 14.9 % (ref 11.5–15.5)
WBC: 7.2 10*3/uL (ref 4.0–10.5)
nRBC: 0 % (ref 0.0–0.2)

## 2023-10-07 LAB — HCG, SERUM, QUALITATIVE: Preg, Serum: NEGATIVE

## 2023-10-07 LAB — LIPASE, BLOOD: Lipase: 99 U/L — ABNORMAL HIGH (ref 11–51)

## 2023-10-07 MED ORDER — ONDANSETRON 4 MG PO TBDP
4.0000 mg | ORAL_TABLET | Freq: Once | ORAL | Status: AC | PRN
  Administered 2023-10-07: 4 mg via ORAL

## 2023-10-07 NOTE — ED Notes (Signed)
Pt given urine cup.

## 2023-10-07 NOTE — ED Triage Notes (Signed)
Per [facility] pt coming from home c/o abdominal and nausea x 4 days. States pain radiates into back. Has been able to eat until today. No BM in 4 days. Actively vomiting on arrival. Reports decreased urine output.

## 2023-11-19 ENCOUNTER — Encounter: Payer: Self-pay | Admitting: Internal Medicine

## 2023-11-19 ENCOUNTER — Ambulatory Visit (INDEPENDENT_AMBULATORY_CARE_PROVIDER_SITE_OTHER): Payer: Medicaid Other | Admitting: Internal Medicine

## 2023-11-19 VITALS — BP 130/80 | HR 94 | Temp 98.0°F | Ht 63.0 in | Wt 144.2 lb

## 2023-11-19 DIAGNOSIS — L309 Dermatitis, unspecified: Secondary | ICD-10-CM | POA: Diagnosis not present

## 2023-11-19 DIAGNOSIS — Z1159 Encounter for screening for other viral diseases: Secondary | ICD-10-CM

## 2023-11-19 DIAGNOSIS — E559 Vitamin D deficiency, unspecified: Secondary | ICD-10-CM | POA: Diagnosis not present

## 2023-11-19 DIAGNOSIS — F1090 Alcohol use, unspecified, uncomplicated: Secondary | ICD-10-CM | POA: Diagnosis not present

## 2023-11-19 DIAGNOSIS — D508 Other iron deficiency anemias: Secondary | ICD-10-CM | POA: Insufficient documentation

## 2023-11-19 DIAGNOSIS — L0291 Cutaneous abscess, unspecified: Secondary | ICD-10-CM | POA: Diagnosis not present

## 2023-11-19 LAB — CBC WITH DIFFERENTIAL/PLATELET
Basophils Absolute: 0 10*3/uL (ref 0.0–0.1)
Basophils Relative: 0.3 % (ref 0.0–3.0)
Eosinophils Absolute: 0 10*3/uL (ref 0.0–0.7)
Eosinophils Relative: 0.6 % (ref 0.0–5.0)
HCT: 37.1 % (ref 36.0–46.0)
Hemoglobin: 11.9 g/dL — ABNORMAL LOW (ref 12.0–15.0)
Lymphocytes Relative: 46.3 % — ABNORMAL HIGH (ref 12.0–46.0)
Lymphs Abs: 2.4 10*3/uL (ref 0.7–4.0)
MCHC: 32.1 g/dL (ref 30.0–36.0)
MCV: 98.9 fL (ref 78.0–100.0)
Monocytes Absolute: 0.3 10*3/uL (ref 0.1–1.0)
Monocytes Relative: 4.8 % (ref 3.0–12.0)
Neutro Abs: 2.5 10*3/uL (ref 1.4–7.7)
Neutrophils Relative %: 48 % (ref 43.0–77.0)
Platelets: 336 10*3/uL (ref 150.0–400.0)
RBC: 3.75 Mil/uL — ABNORMAL LOW (ref 3.87–5.11)
RDW: 16.6 % — ABNORMAL HIGH (ref 11.5–15.5)
WBC: 5.3 10*3/uL (ref 4.0–10.5)

## 2023-11-19 LAB — COMPREHENSIVE METABOLIC PANEL
ALT: 11 U/L (ref 0–35)
AST: 16 U/L (ref 0–37)
Albumin: 4.3 g/dL (ref 3.5–5.2)
Alkaline Phosphatase: 75 U/L (ref 39–117)
BUN: 6 mg/dL (ref 6–23)
CO2: 25 meq/L (ref 19–32)
Calcium: 9 mg/dL (ref 8.4–10.5)
Chloride: 107 meq/L (ref 96–112)
Creatinine, Ser: 0.64 mg/dL (ref 0.40–1.20)
GFR: 118.28 mL/min (ref >60.00)
Glucose, Bld: 103 mg/dL — ABNORMAL HIGH (ref 70–99)
Potassium: 3.5 meq/L (ref 3.5–5.1)
Sodium: 139 meq/L (ref 135–145)
Total Bilirubin: 0.4 mg/dL (ref 0.2–1.2)
Total Protein: 7.3 g/dL (ref 6.0–8.3)

## 2023-11-19 LAB — B12 AND FOLATE PANEL
Folate: 10.9 ng/mL (ref >5.9)
Vitamin B-12: 110 pg/mL — ABNORMAL LOW (ref 211–911)

## 2023-11-19 LAB — VITAMIN D 25 HYDROXY (VIT D DEFICIENCY, FRACTURES): VITD: 12.78 ng/mL — ABNORMAL LOW (ref 30.00–100.00)

## 2023-11-19 MED ORDER — SULFAMETHOXAZOLE-TRIMETHOPRIM 800-160 MG PO TABS: 1.0000 | ORAL_TABLET | Freq: Two times a day (BID) | ORAL | 0 refills | Status: AC

## 2023-11-19 MED ORDER — TRIAMCINOLONE ACETONIDE 0.1 % EX CREA: 1.0000 | TOPICAL_CREAM | Freq: Two times a day (BID) | CUTANEOUS | 1 refills | Status: DC
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
## 2023-11-19 NOTE — Patient Instructions (Signed)
Use warm compresses to armpits . Complete antibiotic as directed. Return if symptoms worsen or fail to improve

## 2023-11-20 LAB — HEPATITIS C ANTIBODY: Hepatitis C Ab: NONREACTIVE

## 2023-11-24 ENCOUNTER — Other Ambulatory Visit: Payer: Self-pay | Admitting: Internal Medicine

## 2023-11-24 DIAGNOSIS — E538 Deficiency of other specified B group vitamins: Secondary | ICD-10-CM

## 2023-11-24 DIAGNOSIS — E559 Vitamin D deficiency, unspecified: Secondary | ICD-10-CM

## 2023-11-24 DIAGNOSIS — D508 Other iron deficiency anemias: Secondary | ICD-10-CM

## 2023-11-24 MED ORDER — CYANOCOBALAMIN 1000 MCG/ML IJ SOLN: 1000.0000 ug | INTRAMUSCULAR | Status: AC

## 2023-11-24 MED ORDER — VITAMIN D (ERGOCALCIFEROL) 1.25 MG (50000 UNIT) PO CAPS: 50000.0000 [IU] | ORAL_CAPSULE | ORAL | 1 refills | Status: DC

## 2023-11-24 MED ORDER — IRON (FERROUS SULFATE) 325 (65 FE) MG PO TABS: 325.0000 mg | ORAL_TABLET | Freq: Every day | ORAL | 1 refills | Status: DC

## 2023-12-05 ENCOUNTER — Ambulatory Visit: Payer: Medicaid Other | Admitting: Internal Medicine

## 2023-12-09 ENCOUNTER — Telehealth: Payer: Self-pay

## 2023-12-09 DIAGNOSIS — L309 Dermatitis, unspecified: Secondary | ICD-10-CM

## 2023-12-09 DIAGNOSIS — D508 Other iron deficiency anemias: Secondary | ICD-10-CM

## 2023-12-09 DIAGNOSIS — E559 Vitamin D deficiency, unspecified: Secondary | ICD-10-CM

## 2023-12-09 MED ORDER — SULFAMETHOXAZOLE-TRIMETHOPRIM 800-160 MG PO TABS: 1.0000 | ORAL_TABLET | Freq: Two times a day (BID) | ORAL | 0 refills | Status: DC

## 2023-12-09 MED ORDER — VITAMIN D (ERGOCALCIFEROL) 1.25 MG (50000 UNIT) PO CAPS: 50000.0000 [IU] | ORAL_CAPSULE | ORAL | 1 refills | Status: DC

## 2023-12-09 MED ORDER — TRIAMCINOLONE ACETONIDE 0.1 % EX CREA: 1.0000 | TOPICAL_CREAM | Freq: Two times a day (BID) | CUTANEOUS | 1 refills | Status: DC

## 2023-12-09 MED ORDER — IRON (FERROUS SULFATE) 325 (65 FE) MG PO TABS: 325.0000 mg | ORAL_TABLET | Freq: Every day | ORAL | 1 refills | Status: DC

## 2023-12-09 NOTE — Telephone Encounter (Signed)
 Rx sent to the pharmacy.

## 2023-12-09 NOTE — Addendum Note (Signed)
 Addended by: Mary Sella D on: 12/09/2023 04:22 PM   Modules accepted: Orders

## 2023-12-09 NOTE — Telephone Encounter (Signed)
 FYI- Patient is scheduled for office visit on 12/10/23  Copied from CRM [id number]. Topic: Clinical - Prescription Issue >> Dec 09, 2023  3:51 PM Eleanor C wrote: Reason for CRM: Patient called and stated that the medications that doctor sent in for her already on 12/18 after their visit she forgot to pick up. Agent didn't want to do another refill request since this had all already been requested before, patient just forgot to pick it up. It was for the triamcinolone  cream (KENALOG ) 0.1 %, Vitamin D , Ergocalciferol , (DRISDOL ) 1.25 MG (50000 UNIT) CAPS capsule, Iron , Ferrous Sulfate , 325 (65 Fe) MG TABS, cyanocobalamin  (VITAMIN B12) injection 1,000 mcg, and to start Bactrim 

## 2023-12-09 NOTE — Telephone Encounter (Signed)
 Okay to send refills.

## 2023-12-10 ENCOUNTER — Ambulatory Visit: Payer: Medicaid Other | Admitting: Internal Medicine

## 2023-12-12 ENCOUNTER — Telehealth: Payer: Self-pay | Admitting: Internal Medicine

## 2023-12-12 NOTE — Telephone Encounter (Signed)
 12/10/2023 1st no show, letter sent via [facility]

## 2023-12-12 NOTE — Telephone Encounter (Signed)
 Provider aware

## 2024-01-06 ENCOUNTER — Encounter: Payer: Self-pay | Admitting: Obstetrics and Gynecology

## 2024-01-06 ENCOUNTER — Ambulatory Visit: Payer: Medicaid Other | Admitting: Obstetrics and Gynecology

## 2024-01-06 ENCOUNTER — Other Ambulatory Visit (HOSPITAL_COMMUNITY)
Admission: RE | Admit: 2024-01-06 | Discharge: 2024-01-06 | Disposition: A | Discharge status: Home / Self Care | Payer: Medicaid Other | Source: Ambulatory Visit | Attending: Obstetrics and Gynecology | Admitting: Obstetrics and Gynecology

## 2024-01-06 VITALS — BP 127/86 | HR 98 | Ht 63.0 in | Wt 148.1 lb

## 2024-01-06 DIAGNOSIS — Z3202 Encounter for pregnancy test, result negative: Secondary | ICD-10-CM

## 2024-01-06 DIAGNOSIS — Z30013 Encounter for initial prescription of injectable contraceptive: Secondary | ICD-10-CM | POA: Diagnosis not present

## 2024-01-06 DIAGNOSIS — Z01419 Encounter for gynecological examination (general) (routine) without abnormal findings: Secondary | ICD-10-CM

## 2024-01-06 DIAGNOSIS — N946 Dysmenorrhea, unspecified: Secondary | ICD-10-CM | POA: Diagnosis not present

## 2024-01-06 DIAGNOSIS — Z113 Encounter for screening for infections with a predominantly sexual mode of transmission: Secondary | ICD-10-CM | POA: Diagnosis not present

## 2024-01-06 LAB — POCT URINE PREGNANCY: Preg Test, Ur: NEGATIVE

## 2024-01-06 MED ORDER — IBUPROFEN 800 MG PO TABS: 800.0000 mg | ORAL_TABLET | Freq: Three times a day (TID) | ORAL | 2 refills | Status: DC | PRN

## 2024-01-06 MED ORDER — MEDROXYPROGESTERONE ACETATE 150 MG/ML IM SUSP
150.0000 mg | Freq: Once | INTRAMUSCULAR | Status: AC
  Administered 2024-01-06: 150 mg via INTRAMUSCULAR

## 2024-01-06 NOTE — Progress Notes (Signed)
Pt. Presents for annual. Pt. Would like to get  depo for bc today. Pt has no other questions or concerns at this time.

## 2024-01-06 NOTE — Progress Notes (Signed)
 GYNECOLOGY ANNUAL PREVENTATIVE CARE ENCOUNTER NOTE  History:     Audrey Schultz is a 33 y.o. [id number] female here for a routine annual gynecologic exam.  Current complaints: pain with menses.   Denies abnormal vaginal bleeding, discharge, pelvic pain, problems with intercourse or other gynecologic concerns.    Gynecologic History Patient's last menstrual period was 12/22/2023 (exact date). Contraception: Depo-Provera  injections Last Pap: no record.  Last mammogram: n/a.   Obstetric History OB History  Gravida Para Term Preterm AB Living  1 1 1  0 0 1  SAB IAB Ectopic Multiple Live Births  0 0 0  1    # Outcome Date GA Lbr Len/2nd Weight Sex Type Anes PTL Lv  1 Term 05/08/14 [redacted]w[redacted]d 13:31 / 00:49 6 lb 5.1 oz (2.865 kg) F Vag-Spont EPI  LIV    Past Medical History:  Diagnosis Date   Anemia    hx w/pregnancy   Dysmenorrhea    Medical history non-contributory    Nicotine  dependence 01/10/2020   Vitamin D  deficiency 01/10/2020    Past Surgical History:  Procedure Laterality Date   INTRAMEDULLARY (IM) NAIL INTERTROCHANTERIC Right 01/07/2020   Procedure: INTRAMEDULLARY (IM) NAIL INTERTROCHANTRIC;  Surgeon: Celena Sharper, MD;  Location: [facility] OR;  Service: Orthopedics;  Laterality: Right;   ORIF ANKLE FRACTURE Left 01/20/2020   Procedure: OPEN REDUCTION INTERNAL FIXATION (ORIF) ANKLE FRACTURE;  Surgeon: Celena Sharper, MD;  Location: [facility] OR;  Service: Orthopedics;  Laterality: Left;    Current Outpatient Medications on File Prior to Visit  Medication Sig Dispense Refill   Iron , Ferrous Sulfate , 325 (65 Fe) MG TABS Take 325 mg by mouth daily. 90 tablet 1   Multiple Vitamin (MULTIVITAMIN WITH MINERALS) TABS tablet Take 1 tablet by mouth daily. 30 tablet 0   No current facility-administered medications on file prior to visit.    Allergies  Allergen Reactions   Shellfish Allergy Anaphylaxis    Social History:  reports that she has been smoking cigarettes. She has a 2.8 pack-year  smoking history. She has never used smokeless tobacco. She reports current alcohol  use of about 4.0 - 5.0 standard drinks of alcohol  per week. She reports current drug use. Drug: Marijuana.  Family History  Problem Relation Age of Onset   Hypertension Mother    Diabetes Mother     The following portions of the patient's history were reviewed and updated as appropriate: allergies, current medications, past family history, past medical history, past social history, past surgical history and problem list.  Review of Systems Pertinent items noted in HPI and remainder of comprehensive ROS otherwise negative.  Physical Exam:  BP 127/86   Pulse 98   Ht 5' 3 (1.6 m)   Wt 148 lb 1.6 oz (67.2 kg)   LMP 12/22/2023 (Exact Date)   BMI 26.23 kg/m  CONSTITUTIONAL: Well-developed, well-nourished female in no acute distress.  HENT:  Normocephalic, atraumatic, External right and left ear normal. Oropharynx is clear and moist EYES: Conjunctivae and EOM are normal. NECK: Normal range of motion, supple, no masses.  Normal thyroid.  SKIN: Skin is warm and dry. No rash noted. Not diaphoretic. No erythema. No pallor. MUSCULOSKELETAL: Normal range of motion. No tenderness.  No cyanosis, clubbing, or edema.  2+ distal pulses. NEUROLOGIC: Alert and oriented to person, place, and time. Normal reflexes, muscle tone coordination.  PSYCHIATRIC: Normal mood and affect. Normal behavior. Normal judgment and thought content. CARDIOVASCULAR: Normal heart rate noted, regular rhythm RESPIRATORY: Clear to auscultation bilaterally. Effort  and breath sounds normal, no problems with respiration noted. BREASTS: Symmetric in size. No masses, tenderness, skin changes, nipple drainage, or lymphadenopathy bilaterally. Performed in the presence of a chaperone. ABDOMEN: Soft, no distention noted.  No tenderness, rebound or guarding.  PELVIC: Normal appearing external genitalia and urethral meatus; normal appearing vaginal mucosa  and cervix.  No abnormal discharge noted.  Pap smear obtained.  Vaginal swab taken. Normal uterine size, no other palpable masses, no uterine or adnexal tenderness.  Performed in the presence of a chaperone.   Assessment and Plan:    1. Women's annual routine gynecological examination (Primary) Normal annual exam Pt desires depo provera  shot for contraception and hopefully amenorrhea - Cytology - PAP( [place])  2. Routine screening for STI (sexually transmitted infection) Per pt request  - Cervicovaginal ancillary only( [place]) - HIV antibody (with reflex) - RPR - POCT urine pregnancy - medroxyPROGESTERone  (DEPO-PROVERA ) injection 150 mg - Hepatitis C Antibody - Hepatitis B Surface AntiGEN  3. Dysmenorrhea Pt notes pain with menses.  She has used depo provera  in the past to stop menses and decrease pain. Pt also advised to treat with NSAIDs at the start of cycle to decrease discomfort.   - ibuprofen  (ADVIL ) 800 MG tablet; Take 1 tablet (800 mg total) by mouth 3 (three) times daily with meals as needed for headache, moderate pain (pain score 4-6) or cramping.  Dispense: 30 tablet; Refill: 2  Will follow up results of pap smear and manage accordingly. Routine preventative health maintenance measures emphasized. Please refer to After Visit Summary for other counseling recommendations.     F/u in 1 year or prn  Jerilynn Buddle, MD, FACOG Obstetrician & Gynecologist, [facility] for [employer], [facility]

## 2024-01-07 LAB — CERVICOVAGINAL ANCILLARY ONLY
Bacterial Vaginitis (gardnerella): POSITIVE — AB
Candida Glabrata: NEGATIVE
Candida Vaginitis: NEGATIVE
Chlamydia: POSITIVE — AB
Comment: NEGATIVE
Comment: NEGATIVE
Comment: NEGATIVE
Comment: NEGATIVE
Comment: NEGATIVE
Comment: NORMAL
Neisseria Gonorrhea: NEGATIVE
Trichomonas: POSITIVE — AB

## 2024-01-07 LAB — RPR: RPR Ser Ql: NONREACTIVE

## 2024-01-07 LAB — HEPATITIS C ANTIBODY: Hep C Virus Ab: NONREACTIVE

## 2024-01-07 LAB — HEPATITIS B SURFACE ANTIGEN: Hepatitis B Surface Ag: NEGATIVE

## 2024-01-07 LAB — HIV ANTIBODY (ROUTINE TESTING W REFLEX): HIV Screen 4th Generation wRfx: NONREACTIVE

## 2024-01-08 ENCOUNTER — Encounter: Payer: Self-pay | Admitting: Obstetrics and Gynecology

## 2024-01-08 ENCOUNTER — Telehealth: Payer: Self-pay

## 2024-01-08 MED ORDER — DOXYCYCLINE HYCLATE 100 MG PO CAPS: 100.0000 mg | ORAL_CAPSULE | Freq: Two times a day (BID) | ORAL | 0 refills | Status: DC

## 2024-01-08 MED ORDER — METRONIDAZOLE 500 MG PO TABS: 500.0000 mg | ORAL_TABLET | Freq: Two times a day (BID) | ORAL | 0 refills | Status: DC

## 2024-01-08 NOTE — Telephone Encounter (Signed)
 Returned call, discussed +STD results with patient, sent medications per protocol, advised of partner treatment and follow up testing. Faxed for to [facility]

## 2024-01-09 LAB — CYTOLOGY - PAP
Comment: NEGATIVE
Diagnosis: UNDETERMINED — AB
High risk HPV: POSITIVE — AB

## 2024-01-20 NOTE — Progress Notes (Signed)
 TC. Pt advised of results and recommendation for colpo. Pt questions were answered. Pt verbalized understanding. My Chart message with information on colposcopy and HPV virus sent. Call transferred to schedulers.

## 2024-02-25 ENCOUNTER — Encounter: Payer: Medicaid Other | Admitting: Obstetrics and Gynecology

## 2024-02-29 ENCOUNTER — Encounter (HOSPITAL_COMMUNITY): Payer: Self-pay

## 2024-02-29 ENCOUNTER — Emergency Department (HOSPITAL_COMMUNITY)
Admission: EM | Admit: 2024-02-29 | Discharge: 2024-02-29 | Disposition: A | Discharge status: Home / Self Care | Attending: Emergency Medicine | Admitting: Emergency Medicine

## 2024-02-29 ENCOUNTER — Other Ambulatory Visit: Payer: Self-pay

## 2024-02-29 DIAGNOSIS — R509 Fever, unspecified: Secondary | ICD-10-CM | POA: Insufficient documentation

## 2024-02-29 DIAGNOSIS — K852 Alcohol induced acute pancreatitis without necrosis or infection: Secondary | ICD-10-CM | POA: Insufficient documentation

## 2024-02-29 DIAGNOSIS — D72829 Elevated white blood cell count, unspecified: Secondary | ICD-10-CM | POA: Insufficient documentation

## 2024-02-29 DIAGNOSIS — E876 Hypokalemia: Secondary | ICD-10-CM | POA: Insufficient documentation

## 2024-02-29 LAB — ETHANOL: Alcohol, Ethyl (B): <10 mg/dL (ref <10)

## 2024-02-29 LAB — COMPREHENSIVE METABOLIC PANEL WITH GFR
ALT: 17 U/L (ref 0–44)
AST: 22 U/L (ref 15–41)
Albumin: 4.1 g/dL (ref 3.5–5.0)
Alkaline Phosphatase: 72 U/L (ref 38–126)
Anion gap: 12 (ref 5–15)
BUN: 6 mg/dL (ref 6–20)
CO2: 25 mmol/L (ref 22–32)
Calcium: 9.6 mg/dL (ref 8.9–10.3)
Chloride: 100 mmol/L (ref 98–111)
Creatinine, Ser: 0.68 mg/dL (ref 0.44–1.00)
GFR, Estimated: >60 mL/min (ref >60)
Glucose, Bld: 145 mg/dL — ABNORMAL HIGH (ref 70–99)
Potassium: 2.7 mmol/L — CL (ref 3.5–5.1)
Sodium: 137 mmol/L (ref 135–145)
Total Bilirubin: 0.5 mg/dL (ref 0.0–1.2)
Total Protein: 7.7 g/dL (ref 6.5–8.1)

## 2024-02-29 LAB — CBC WITH DIFFERENTIAL/PLATELET
Abs Immature Granulocytes: 0.03 10*3/uL (ref 0.00–0.07)
Basophils Absolute: 0 10*3/uL (ref 0.0–0.1)
Basophils Relative: 0 %
Eosinophils Absolute: 0 10*3/uL (ref 0.0–0.5)
Eosinophils Relative: 0 %
HCT: 39 % (ref 36.0–46.0)
Hemoglobin: 12.9 g/dL (ref 12.0–15.0)
Immature Granulocytes: 0 %
Lymphocytes Relative: 11 %
Lymphs Abs: 0.9 10*3/uL (ref 0.7–4.0)
MCH: 32.2 pg (ref 26.0–34.0)
MCHC: 33.1 g/dL (ref 30.0–36.0)
MCV: 97.3 fL (ref 80.0–100.0)
Monocytes Absolute: 0.3 10*3/uL (ref 0.1–1.0)
Monocytes Relative: 3 %
Neutro Abs: 6.9 10*3/uL (ref 1.7–7.7)
Neutrophils Relative %: 86 %
Platelets: 273 10*3/uL (ref 150–400)
RBC: 4.01 MIL/uL (ref 3.87–5.11)
RDW: 13 % (ref 11.5–15.5)
WBC: 8.1 10*3/uL (ref 4.0–10.5)
nRBC: 0 % (ref 0.0–0.2)

## 2024-02-29 LAB — URINALYSIS, ROUTINE W REFLEX MICROSCOPIC
Bilirubin Urine: NEGATIVE
Glucose, UA: NEGATIVE mg/dL
Ketones, ur: 5 mg/dL — AB
Nitrite: NEGATIVE
Protein, ur: 100 mg/dL — AB
Specific Gravity, Urine: 1.031 — ABNORMAL HIGH (ref 1.005–1.030)
pH: 6 (ref 5.0–8.0)

## 2024-02-29 LAB — LIPASE, BLOOD: Lipase: 146 U/L — ABNORMAL HIGH (ref 11–51)

## 2024-02-29 LAB — RESP PANEL BY RT-PCR (RSV, FLU A&B, COVID)  RVPGX2
Influenza A by PCR: NEGATIVE
Influenza B by PCR: NEGATIVE
Resp Syncytial Virus by PCR: NEGATIVE
SARS Coronavirus 2 by RT PCR: NEGATIVE

## 2024-02-29 LAB — HCG, QUANTITATIVE, PREGNANCY: hCG, Beta Chain, Quant, S: <1 m[IU]/mL (ref <5)

## 2024-02-29 LAB — MAGNESIUM: Magnesium: 2 mg/dL (ref 1.7–2.4)

## 2024-02-29 MED ORDER — LACTATED RINGERS IV BOLUS
1000.0000 mL | Freq: Once | INTRAVENOUS | Status: AC
  Administered 2024-02-29: 1000 mL via INTRAVENOUS

## 2024-02-29 MED ORDER — ONDANSETRON HCL 4 MG/2ML IJ SOLN
4.0000 mg | Freq: Once | INTRAMUSCULAR | Status: AC
  Administered 2024-02-29: 4 mg via INTRAVENOUS
  Filled 2024-02-29: qty 2

## 2024-02-29 MED ORDER — OXYCODONE HCL 5 MG PO TABS: 5.0000 mg | ORAL_TABLET | ORAL | 0 refills | Status: AC | PRN

## 2024-02-29 MED ORDER — HYDROMORPHONE HCL 1 MG/ML IJ SOLN
1.0000 mg | Freq: Once | INTRAMUSCULAR | Status: AC
  Administered 2024-02-29: 1 mg via INTRAVENOUS
  Filled 2024-02-29: qty 1

## 2024-02-29 MED ORDER — POTASSIUM CHLORIDE 20 MEQ PO PACK
40.0000 meq | PACK | Freq: Once | ORAL | Status: AC
  Administered 2024-02-29: 40 meq via ORAL
  Filled 2024-02-29: qty 2

## 2024-02-29 MED ORDER — POTASSIUM CHLORIDE 10 MEQ/100ML IV SOLN
10.0000 meq | Freq: Once | INTRAVENOUS | Status: AC
  Administered 2024-02-29: 10 meq via INTRAVENOUS
  Filled 2024-02-29: qty 100

## 2024-02-29 MED ORDER — CYCLOBENZAPRINE HCL 10 MG PO TABS: 5.0000 mg | ORAL_TABLET | Freq: Every day | ORAL | 0 refills | Status: DC

## 2024-02-29 MED ORDER — ONDANSETRON 4 MG PO TBDP: 4.0000 mg | ORAL_TABLET | Freq: Three times a day (TID) | ORAL | 0 refills | Status: DC | PRN

## 2024-02-29 MED ORDER — LORAZEPAM 2 MG/ML IJ SOLN
1.0000 mg | Freq: Once | INTRAMUSCULAR | Status: AC
  Administered 2024-02-29: 1 mg via INTRAVENOUS
  Filled 2024-02-29: qty 1

## 2024-02-29 MED ORDER — POTASSIUM CHLORIDE ER 10 MEQ PO TBCR: 10.0000 meq | EXTENDED_RELEASE_TABLET | Freq: Every day | ORAL | 0 refills | Status: DC

## 2024-02-29 MED ORDER — FENTANYL CITRATE PF 50 MCG/ML IJ SOSY
50.0000 ug | PREFILLED_SYRINGE | Freq: Once | INTRAMUSCULAR | Status: AC
  Administered 2024-02-29: 50 ug via INTRAVENOUS
  Filled 2024-02-29: qty 1

## 2024-02-29 NOTE — ED Triage Notes (Signed)
 Pt BIB [facility] from home d/t abd pain the last 5 days, does have emesis with the pain & has not been able to hold down food the last 5 days as well. Does have Hx of pancreatitis & also notes that she is 10 days late for her menstrual, last cycle was on Feb 20th, is on the Depo shot. VSS: 138/palpated, 104 bpm, 98% on RA, CBG 139.

## 2024-02-29 NOTE — Discharge Instructions (Addendum)
 You labs today showed that you have an elevation in your pancreas enzyme (lipase) which is consistent with pancreatitis.   Your potassium was low today and you were given potassium supplements.  I prescribed a potassium supplements to continue taking at home.  Please take these as prescribed.  Your other electrolytes are normal today.  Your kidney and liver labs were normal. Your blood counts were normal.  Your pregnancy test is negative.  Please keep well hydrated at home with water and electrolyte solutions such as Pedialyte. Please eat a liquid diet (soups, smoothies, broths) for the next 1-2 days, then advance your diet as tolerated.  You have been prescribed Oxycodone-this is a narcotic/controlled substance medication that has potential addicting qualities.  You may take 1 tablet every 4-6 hours as needed for severe pain.  Do not drive or operate heavy machinery when taking this medicine as it can be sedating. Do not drink alcohol or take other sedating medications when taking this medicine for safety reasons.  Keep this out of reach of small children.    You have been prescribed a muscle relaxer called Flexeril (cyclobenzaprine). You may take 0.5 - 1 tablet (5-10mg ) before bed as needed for muscle pain. This medication can be sedating. Do not drive or operate heavy machinery after taking this medicine. Do not drink alcohol or take other sedating medications when taking this medicine for safety reasons.  Keep this out of reach of small children.     Please follow up with your PCP within the next week for a recheck of your potassium levels. Please also discuss methods of alcohol cessation with your PCP or have them refer you to psychiatry for further help. Alcohol use is a known cause of pancreatitis.

## 2024-02-29 NOTE — ED Provider Notes (Signed)
 [place] EMERGENCY DEPARTMENT AT [facility] Provider Note   CSN: [id number] Arrival date & time: 02/29/24  1051     History  Chief Complaint  Patient presents with   Abdominal Pain    Audrey Schultz is a 33 y.o. female with alcohol use disorder, pancreatitis, presents with concern for another pancreatitis episode.  States she has been having pain in her upper abdomen ongoing for the last 5 days.  She reports she is able to keep liquids down, but vomits up food she tries to eat.  Reports her stools have been mucousy.  She reports subjective fever and chills, denies any cough.  Denies any dysuria, hematuria, or increase frequency.  She does report drinking about 6-8 shots per day.  States she sometimes feels tremulous when she does not drink, but has never had to be hospitalized for this.  She currently denies any tremulousness or hallucinations.  Abdominal Pain      Home Medications Prior to Admission medications   Medication Sig Start Date End Date Taking? Authorizing Provider  cyclobenzaprine (FLEXERIL) 10 MG tablet Take 0.5-1 tablets (5-10 mg total) by mouth at bedtime. 02/29/24  Yes Arabella Merles, PA-C  ondansetron (ZOFRAN-ODT) 4 MG disintegrating tablet Take 1 tablet (4 mg total) by mouth every 8 (eight) hours as needed for nausea or vomiting. 02/29/24  Yes Arabella Merles, PA-C  oxyCODONE (ROXICODONE) 5 MG immediate release tablet Take 1 tablet (5 mg total) by mouth every 4 (four) hours as needed for up to 5 days for severe pain (pain score 7-10). 02/29/24 03/05/24 Yes Arabella Merles, PA-C  potassium chloride (KLOR-CON) 10 MEQ tablet Take 1 tablet (10 mEq total) by mouth daily for 5 days. 02/29/24 03/05/24 Yes Arabella Merles, PA-C  doxycycline (VIBRAMYCIN) 100 MG capsule Take 1 capsule (100 mg total) by mouth 2 (two) times daily. 01/08/24   Warden Fillers, MD  ibuprofen (ADVIL) 800 MG tablet Take 1 tablet (800 mg total) by mouth 3 (three) times daily with  meals as needed for headache, moderate pain (pain score 4-6) or cramping. 01/06/24   Warden Fillers, MD  Iron, Ferrous Sulfate, 325 (65 Fe) MG TABS Take 325 mg by mouth daily. 12/09/23   Salvatore Decent, FNP  metroNIDAZOLE (FLAGYL) 500 MG tablet Take 1 tablet (500 mg total) by mouth 2 (two) times daily. 01/08/24   Warden Fillers, MD  Multiple Vitamin (MULTIVITAMIN WITH MINERALS) TABS tablet Take 1 tablet by mouth daily. 03/19/22   Ghimire, Werner Lean, MD      Allergies    Shellfish allergy    Review of Systems   Review of Systems  Gastrointestinal:  Positive for abdominal pain.    Physical Exam Updated Vital Signs BP (!) 145/95   Pulse 92   Temp 98.3 F (36.8 C) (Oral)   Resp (!) 21   Ht 5' 3.5" (1.613 m)   Wt 65.8 kg   SpO2 100%   BMI 25.28 kg/m  Physical Exam Vitals and nursing note reviewed.  Constitutional:      General: She is not in acute distress.    Appearance: She is well-developed.     Comments: No active vomiting  HENT:     Head: Normocephalic and atraumatic.  Eyes:     Conjunctiva/sclera: Conjunctivae normal.  Cardiovascular:     Rate and Rhythm: Normal rate and regular rhythm.     Heart sounds: No murmur heard. Pulmonary:     Effort: Pulmonary effort is normal. No respiratory  distress.     Breath sounds: Normal breath sounds.  Abdominal:     Palpations: Abdomen is soft.     Tenderness: There is abdominal tenderness. There is no right CVA tenderness or left CVA tenderness.     Comments: Mild tenderness to palpation in the epigastric region.  No rebound or guarding  Musculoskeletal:        General: No swelling.     Cervical back: Neck supple.  Skin:    General: Skin is warm and dry.     Capillary Refill: Capillary refill takes less than 2 seconds.  Neurological:     Mental Status: She is alert.  Psychiatric:        Mood and Affect: Mood normal.     ED Results / Procedures / Treatments   Labs (all labs ordered are listed, but only abnormal results are  displayed) Labs Reviewed  COMPREHENSIVE METABOLIC PANEL WITH GFR - Abnormal; Notable for the following components:      Result Value   Potassium 2.7 (*)    Glucose, Bld 145 (*)    All other components within normal limits  LIPASE, BLOOD - Abnormal; Notable for the following components:   Lipase 146 (*)    All other components within normal limits  URINALYSIS, ROUTINE W REFLEX MICROSCOPIC - Abnormal; Notable for the following components:   Color, Urine AMBER (*)    APPearance HAZY (*)    Specific Gravity, Urine 1.031 (*)    Hgb urine dipstick MODERATE (*)    Ketones, ur 5 (*)    Protein, ur 100 (*)    Leukocytes,Ua MODERATE (*)    Bacteria, UA RARE (*)    All other components within normal limits  RESP PANEL BY RT-PCR (RSV, FLU A&B, COVID)  RVPGX2  CBC WITH DIFFERENTIAL/PLATELET  HCG, QUANTITATIVE, PREGNANCY  ETHANOL  MAGNESIUM    EKG None  Radiology No results found.  Procedures Procedures    Medications Ordered in ED Medications  fentaNYL (SUBLIMAZE) injection 50 mcg (50 mcg Intravenous Given 02/29/24 1137)  ondansetron (ZOFRAN) injection 4 mg (4 mg Intravenous Given 02/29/24 1135)  lactated ringers bolus 1,000 mL (0 mLs Intravenous Stopped 02/29/24 1346)  LORazepam (ATIVAN) injection 1 mg (1 mg Intravenous Given 02/29/24 1158)  HYDROmorphone (DILAUDID) injection 1 mg (1 mg Intravenous Given 02/29/24 1402)  potassium chloride (KLOR-CON) packet 40 mEq (40 mEq Oral Given 02/29/24 1406)  potassium chloride 10 mEq in 100 mL IVPB (0 mEq Intravenous Stopped 02/29/24 1544)  lactated ringers bolus 1,000 mL (0 mLs Intravenous Stopped 02/29/24 1544)    ED Course/ Medical Decision Making/ A&P Clinical Course as of 02/29/24 1547  Sun Feb 29, 2024  1338 Upon re-evaluation of patient, states pain was controlled with fentanyl but this is starting to wear off. Will give some additional fluids with Kcl for repletion, trial PO intake, and add on dilaudid for pain  [AF]    Clinical  Course User Index [AF] Arabella Merles, PA-C                                 Medical Decision Making Amount and/or Complexity of Data Reviewed Labs: ordered.  Risk Prescription drug management.     Differential diagnosis includes but is not limited to Cholelithiasis, cholangitis, choledocholithiasis, peptic ulcer, gastritis, gastroenteritis, appendicitis, IBS, IBD, DKA, nephrolithiasis, UTI, pyelonephritis, pancreatitis, diverticulitis, mesenteric ischemia, abdominal aortic aneurysm, small bowel obstruction, volvulus, ovarian torsion and pregnancy related  concerns in females of childbearing age    ED Course:  Upon initial evaluation, patient does appear somewhat uncomfortable, curled up in the bed.  She has abdominal tenderness in the epigastric region with no rebound or guarding.  No active vomiting.  Vital signs stable aside from slight tachycardia to 104 and elevated blood pressure of 143/107.  Given she feels like this is similar to previous pancreatitis episodes, will start her on LR bolus and fentanyl for pain management and Zofran for nausea management.  Will also give 1 mg Ativan to prevent any alcohol withdrawals. I Ordered, and personally interpreted labs.  The pertinent results include:   Lipase elevated at 146 CMP with hypokalemia at 2.7, no other electrolyte abnormalities.  No elevation in creatinine or LFTs CBC unremarkable Ethanol level undetectable Pregnancy negative Urinalysis with high specific gravity, leukocytes present but appears to be a unclean catch with squamous epithelial cells present 1:44 PM upon re-evaluation, patient states pain was well-controlled with the medications given earlier, but pain starting to return. Will give dilaudid for longer lasting pain control.  Given potassium was low, will give 40 mEq oral repletion.  Will see if she can tolerate p.o. intake.  Will also give another LR bolus with 10 mEq KCl repletion.  Will check magnesium.  Given labs  with elevated lipase at 146, with no leukocytosis, no elevation in creatinine or LFTs, and patient stating this is exactly similar to previous episodes of pancreatitis, do not feel we need to pursue CT imaging at this time. Low concern for any other emergent abdominal pathology. She has had multiple CT scans of the abdomen and the past year for pancreatitis episodes. I considered admission for pain control, however, she feels she can manage this pain at home with oral pain medication. No concern for acute alcohol withdrawal at this time given stable vitals and no withdrawal symptoms 3:30 PM.  Upon reevaluation, patient states pain still well-controlled and she is tolerating p.o. intake of ginger ale and oral kcl supplement.  States she would like to go home at this time.  She does request a muscle relaxer for at home to help with her pain, will prescribe this in addition to Zofran and oxycodone.  Patient stable and appropriate for discharge home  Impression: Acute pancreatitis secondary to alcohol use Hypokalemia  Disposition:  The patient was discharged home with instructions to take prescribed oxycodone at home as needed for pain.  May also try Tylenol and ibuprofen as needed for pain.  Use Zofran as needed for nausea. Take potassium supplements as prescribed. Maintain liquid diet for the next couple of days and then progress diet as tolerated.  Follow-up with PCP within the next week for a recheck of her symptoms and labs.  Also encouraged patient to discuss alcohol cessation strategies with her PCP. Return precautions given.    Record Review: External records from outside source obtained and reviewed including CT abdomen pelvis reviewed from 01/25/2023 which showed pancreatitis     This chart was dictated using voice recognition software, Dragon. Despite the best efforts of this provider to proofread and correct errors, errors may still occur which can change documentation meaning.           Final Clinical Impression(s) / ED Diagnoses Final diagnoses:  Alcohol-induced acute pancreatitis, unspecified complication status  Hypokalemia    Rx / DC Orders ED Discharge Orders          Ordered    oxyCODONE (ROXICODONE) 5 MG immediate release  tablet  Every 4 hours PRN        02/29/24 1542    ondansetron (ZOFRAN-ODT) 4 MG disintegrating tablet  Every 8 hours PRN        02/29/24 1542    cyclobenzaprine (FLEXERIL) 10 MG tablet  Daily at bedtime        02/29/24 1542    potassium chloride (KLOR-CON) 10 MEQ tablet  Daily        02/29/24 1542              Arabella Merles, PA-C 02/29/24 1547    Lonell Grandchild, MD 02/29/24 2037

## 2024-03-01 ENCOUNTER — Telehealth: Payer: Self-pay | Admitting: Internal Medicine

## 2024-03-01 ENCOUNTER — Inpatient Hospital Stay (HOSPITAL_COMMUNITY)
Admission: EM | Admit: 2024-03-01 | Discharge: 2024-03-03 | DRG: 439 | Disposition: A | Discharge status: Home / Self Care | Attending: Internal Medicine | Admitting: Internal Medicine

## 2024-03-01 ENCOUNTER — Encounter (HOSPITAL_COMMUNITY): Payer: Self-pay | Admitting: Internal Medicine

## 2024-03-01 ENCOUNTER — Emergency Department (HOSPITAL_COMMUNITY)

## 2024-03-01 ENCOUNTER — Telehealth: Payer: Self-pay

## 2024-03-01 DIAGNOSIS — I10 Essential (primary) hypertension: Secondary | ICD-10-CM | POA: Diagnosis present

## 2024-03-01 DIAGNOSIS — R748 Abnormal levels of other serum enzymes: Secondary | ICD-10-CM | POA: Diagnosis present

## 2024-03-01 DIAGNOSIS — Z8249 Family history of ischemic heart disease and other diseases of the circulatory system: Secondary | ICD-10-CM

## 2024-03-01 DIAGNOSIS — Z79899 Other long term (current) drug therapy: Secondary | ICD-10-CM | POA: Diagnosis not present

## 2024-03-01 DIAGNOSIS — R8271 Bacteriuria: Secondary | ICD-10-CM | POA: Diagnosis present

## 2024-03-01 DIAGNOSIS — K852 Alcohol induced acute pancreatitis without necrosis or infection: Principal | ICD-10-CM | POA: Diagnosis present

## 2024-03-01 DIAGNOSIS — Z91013 Allergy to seafood: Secondary | ICD-10-CM | POA: Diagnosis not present

## 2024-03-01 DIAGNOSIS — F1010 Alcohol abuse, uncomplicated: Secondary | ICD-10-CM | POA: Diagnosis present

## 2024-03-01 DIAGNOSIS — E876 Hypokalemia: Secondary | ICD-10-CM | POA: Diagnosis present

## 2024-03-01 DIAGNOSIS — N946 Dysmenorrhea, unspecified: Secondary | ICD-10-CM | POA: Diagnosis present

## 2024-03-01 DIAGNOSIS — E871 Hypo-osmolality and hyponatremia: Secondary | ICD-10-CM | POA: Diagnosis present

## 2024-03-01 DIAGNOSIS — Z87891 Personal history of nicotine dependence: Secondary | ICD-10-CM

## 2024-03-01 DIAGNOSIS — R1010 Upper abdominal pain, unspecified: Secondary | ICD-10-CM | POA: Diagnosis present

## 2024-03-01 LAB — URINALYSIS, ROUTINE W REFLEX MICROSCOPIC
Bilirubin Urine: NEGATIVE
Glucose, UA: NEGATIVE mg/dL
Ketones, ur: 80 mg/dL — AB
Nitrite: POSITIVE — AB
Protein, ur: 30 mg/dL — AB
Specific Gravity, Urine: 1.021 (ref 1.005–1.030)
pH: 6 (ref 5.0–8.0)

## 2024-03-01 LAB — COMPREHENSIVE METABOLIC PANEL WITH GFR
ALT: 12 U/L (ref 0–44)
AST: 15 U/L (ref 15–41)
Albumin: 3.7 g/dL (ref 3.5–5.0)
Alkaline Phosphatase: 63 U/L (ref 38–126)
Anion gap: 11 (ref 5–15)
BUN: <5 mg/dL — ABNORMAL LOW (ref 6–20)
CO2: 24 mmol/L (ref 22–32)
Calcium: 9 mg/dL (ref 8.9–10.3)
Chloride: 98 mmol/L (ref 98–111)
Creatinine, Ser: 0.38 mg/dL — ABNORMAL LOW (ref 0.44–1.00)
GFR, Estimated: >60 mL/min (ref >60)
Glucose, Bld: 107 mg/dL — ABNORMAL HIGH (ref 70–99)
Potassium: 2.8 mmol/L — ABNORMAL LOW (ref 3.5–5.1)
Sodium: 133 mmol/L — ABNORMAL LOW (ref 135–145)
Total Bilirubin: 0.4 mg/dL (ref 0.0–1.2)
Total Protein: 6.8 g/dL (ref 6.5–8.1)

## 2024-03-01 LAB — CBC WITH DIFFERENTIAL/PLATELET
Abs Immature Granulocytes: 0.04 10*3/uL (ref 0.00–0.07)
Basophils Absolute: 0 10*3/uL (ref 0.0–0.1)
Basophils Relative: 0 %
Eosinophils Absolute: 0 10*3/uL (ref 0.0–0.5)
Eosinophils Relative: 0 %
HCT: 35.8 % — ABNORMAL LOW (ref 36.0–46.0)
Hemoglobin: 11.9 g/dL — ABNORMAL LOW (ref 12.0–15.0)
Immature Granulocytes: 0 %
Lymphocytes Relative: 14 %
Lymphs Abs: 1.3 10*3/uL (ref 0.7–4.0)
MCH: 32.6 pg (ref 26.0–34.0)
MCHC: 33.2 g/dL (ref 30.0–36.0)
MCV: 98.1 fL (ref 80.0–100.0)
Monocytes Absolute: 0.4 10*3/uL (ref 0.1–1.0)
Monocytes Relative: 4 %
Neutro Abs: 7.4 10*3/uL (ref 1.7–7.7)
Neutrophils Relative %: 82 %
Platelets: 274 10*3/uL (ref 150–400)
RBC: 3.65 MIL/uL — ABNORMAL LOW (ref 3.87–5.11)
RDW: 12.8 % (ref 11.5–15.5)
WBC: 9.1 10*3/uL (ref 4.0–10.5)
nRBC: 0 % (ref 0.0–0.2)

## 2024-03-01 LAB — LIPASE, BLOOD: Lipase: 212 U/L — ABNORMAL HIGH (ref 11–51)

## 2024-03-01 LAB — MAGNESIUM: Magnesium: 1.7 mg/dL (ref 1.7–2.4)

## 2024-03-01 MED ORDER — SODIUM CHLORIDE 0.9 % IV SOLN: INTRAVENOUS | Status: AC

## 2024-03-01 MED ORDER — HYDROMORPHONE HCL 1 MG/ML IJ SOLN
1.0000 mg | Freq: Once | INTRAMUSCULAR | Status: AC
  Administered 2024-03-01: 1 mg via INTRAVENOUS
  Filled 2024-03-01: qty 1

## 2024-03-01 MED ORDER — ACETAMINOPHEN 325 MG PO TABS
650.0000 mg | ORAL_TABLET | Freq: Four times a day (QID) | ORAL | Status: DC | PRN
  Administered 2024-03-02: 650 mg via ORAL
  Filled 2024-03-01: qty 2

## 2024-03-01 MED ORDER — OXYCODONE HCL 5 MG PO TABS
5.0000 mg | ORAL_TABLET | ORAL | Status: DC | PRN
  Administered 2024-03-01 – 2024-03-02 (×3): 5 mg via ORAL
  Filled 2024-03-01 (×3): qty 1

## 2024-03-01 MED ORDER — ONDANSETRON HCL 4 MG PO TABS
4.0000 mg | ORAL_TABLET | Freq: Four times a day (QID) | ORAL | Status: DC | PRN
  Administered 2024-03-03: 4 mg via ORAL
  Filled 2024-03-01: qty 1

## 2024-03-01 MED ORDER — HYDROMORPHONE HCL 1 MG/ML IJ SOLN
1.0000 mg | INTRAMUSCULAR | Status: DC | PRN
  Administered 2024-03-01 (×2): 1 mg via INTRAVENOUS
  Filled 2024-03-01 (×2): qty 1

## 2024-03-01 MED ORDER — LACTATED RINGERS IV BOLUS
1000.0000 mL | Freq: Once | INTRAVENOUS | Status: AC
  Administered 2024-03-01: 1000 mL via INTRAVENOUS

## 2024-03-01 MED ORDER — IOHEXOL 300 MG/ML  SOLN
100.0000 mL | Freq: Once | INTRAMUSCULAR | Status: AC | PRN
Start: 2024-03-01 — End: 2024-03-01
  Administered 2024-03-01: 100 mL via INTRAVENOUS

## 2024-03-01 MED ORDER — METOCLOPRAMIDE HCL 5 MG/ML IJ SOLN
10.0000 mg | Freq: Once | INTRAMUSCULAR | Status: AC
  Administered 2024-03-01: 10 mg via INTRAVENOUS
  Filled 2024-03-01: qty 2

## 2024-03-01 MED ORDER — ONDANSETRON HCL 4 MG/2ML IJ SOLN: 4.0000 mg | Freq: Four times a day (QID) | INTRAMUSCULAR | Status: DC | PRN

## 2024-03-01 MED ORDER — HYDROMORPHONE HCL 1 MG/ML IJ SOLN
0.5000 mg | INTRAMUSCULAR | Status: DC | PRN
  Administered 2024-03-01 – 2024-03-02 (×2): 0.5 mg via INTRAVENOUS
  Filled 2024-03-01 (×2): qty 0.5

## 2024-03-01 MED ORDER — SODIUM CHLORIDE (PF) 0.9 % IJ SOLN
INTRAMUSCULAR | Status: AC
  Filled 2024-03-01: qty 50

## 2024-03-01 MED ORDER — POTASSIUM CHLORIDE 10 MEQ/100ML IV SOLN
10.0000 meq | INTRAVENOUS | Status: AC
  Administered 2024-03-01 (×6): 10 meq via INTRAVENOUS
  Filled 2024-03-01 (×6): qty 100

## 2024-03-01 MED ORDER — ENOXAPARIN SODIUM 40 MG/0.4ML IJ SOSY
40.0000 mg | PREFILLED_SYRINGE | INTRAMUSCULAR | Status: DC
  Administered 2024-03-01 – 2024-03-02 (×2): 40 mg via SUBCUTANEOUS
  Filled 2024-03-01 (×2): qty 0.4

## 2024-03-01 MED ORDER — ACETAMINOPHEN 650 MG RE SUPP: 650.0000 mg | Freq: Four times a day (QID) | RECTAL | Status: DC | PRN

## 2024-03-01 MED ORDER — MAGNESIUM SULFATE 2 GM/50ML IV SOLN
2.0000 g | Freq: Once | INTRAVENOUS | Status: AC
  Administered 2024-03-01: 2 g via INTRAVENOUS
  Filled 2024-03-01: qty 50

## 2024-03-01 MED ORDER — SODIUM CHLORIDE 0.9 % IV SOLN
1.0000 g | Freq: Once | INTRAVENOUS | Status: AC
  Administered 2024-03-01: 1 g via INTRAVENOUS
  Filled 2024-03-01: qty 10

## 2024-03-01 NOTE — H&P (Signed)
 History and Physical    [name]:[id number] DOB: June 26, 1991 DOA: 03/01/2024  PCP: Salvatore Decent, FNP   Chief Complaint: abd pain  HPI: Audrey Schultz is a 33 y.o. female with medical history significant of with history of anemia, alcohol usage who presents to the ED due to abdominal pain.  Patient presented on 3/30 with abdominal pain nausea and vomiting.  She was given analgesia found to pancreatitis and eventually discharged due to tolerating p.o.  She states that since discharge her p.o. tolerance is worsened and she is unable to take even liquids.  She presented to the ER where she was found to be afebrile and HDS.  Labs were obtained on presentation which showed WBC 9.1, hemoglobin 11.9, sodium 133, potassium 2.8, creatinine 0.38, LFTs within normal limits, lipase 212.  Urinalysis with concern for infection.  Patient underwent CT abdomen pelvis which showed acute pancreatitis.  Patient was started on IV fluids analgesia and admitted for further workup. On admission she endorses drinking 10 BM per day.    Review of Systems: Review of Systems  Constitutional: Negative.  Negative for chills and fever.  HENT: Negative.    Eyes: Negative.   Respiratory: Negative.    Cardiovascular: Negative.   Gastrointestinal:  Positive for abdominal pain, nausea and vomiting.  Genitourinary: Negative.   Musculoskeletal: Negative.   Skin: Negative.   Neurological: Negative.   Endo/Heme/Allergies: Negative.   Psychiatric/Behavioral: Negative.       As per HPI otherwise 10 point review of systems negative.   Allergies  Allergen Reactions   Shellfish Allergy Anaphylaxis    Past Medical History:  Diagnosis Date   Anemia    hx w/pregnancy   Dysmenorrhea    Medical history non-contributory    Nicotine dependence 01/10/2020   Vitamin D deficiency 01/10/2020    Past Surgical History:  Procedure Laterality Date   INTRAMEDULLARY (IM) NAIL INTERTROCHANTERIC Right 01/07/2020    Procedure: INTRAMEDULLARY (IM) NAIL INTERTROCHANTRIC;  Surgeon: Myrene Galas, MD;  Location: [facility] OR;  Service: Orthopedics;  Laterality: Right;   ORIF ANKLE FRACTURE Left 01/20/2020   Procedure: OPEN REDUCTION INTERNAL FIXATION (ORIF) ANKLE FRACTURE;  Surgeon: Myrene Galas, MD;  Location: [facility] OR;  Service: Orthopedics;  Laterality: Left;     reports that she has been smoking cigarettes. She has a 2.8 pack-year smoking history. She has never used smokeless tobacco. She reports current alcohol use of about 4.0 - 5.0 standard drinks of alcohol per week. She reports current drug use. Drug: Marijuana.  Family History  Problem Relation Age of Onset   Hypertension Mother    Diabetes Mother     Prior to Admission medications   Medication Sig Start Date End Date Taking? Authorizing Provider  cyclobenzaprine (FLEXERIL) 10 MG tablet Take 0.5-1 tablets (5-10 mg total) by mouth at bedtime. 02/29/24   Arabella Merles, PA-C  doxycycline (VIBRAMYCIN) 100 MG capsule Take 1 capsule (100 mg total) by mouth 2 (two) times daily. 01/08/24   Warden Fillers, MD  ibuprofen (ADVIL) 800 MG tablet Take 1 tablet (800 mg total) by mouth 3 (three) times daily with meals as needed for headache, moderate pain (pain score 4-6) or cramping. 01/06/24   Warden Fillers, MD  Iron, Ferrous Sulfate, 325 (65 Fe) MG TABS Take 325 mg by mouth daily. 12/09/23   Salvatore Decent, FNP  metroNIDAZOLE (FLAGYL) 500 MG tablet Take 1 tablet (500 mg total) by mouth 2 (two) times daily. 01/08/24   Warden Fillers, MD  Multiple Vitamin (MULTIVITAMIN WITH MINERALS) TABS tablet Take 1 tablet by mouth daily. 03/19/22   Ghimire, Werner Lean, MD  ondansetron (ZOFRAN-ODT) 4 MG disintegrating tablet Take 1 tablet (4 mg total) by mouth every 8 (eight) hours as needed for nausea or vomiting. 02/29/24   Arabella Merles, PA-C  oxyCODONE (ROXICODONE) 5 MG immediate release tablet Take 1 tablet (5 mg total) by mouth every 4 (four) hours as needed for up to 5 days for  severe pain (pain score 7-10). 02/29/24 03/05/24  Arabella Merles, PA-C  potassium chloride (KLOR-CON) 10 MEQ tablet Take 1 tablet (10 mEq total) by mouth daily for 5 days. 02/29/24 03/05/24  Arabella Merles, PA-C    Physical Exam: Vitals:   03/01/24 1045 03/01/24 1344 03/01/24 1500 03/01/24 1530  BP:  (!) 156/110 (!) 152/108 (!) 144/104  Pulse:  93 91 87  Resp:  20 20   Temp:  98.7 F (37.1 C)    TempSrc:  Oral    SpO2:  100% 100% 98%  Weight: 65.8 kg     Height: 5\' 3"  (1.6 m)      Physical Exam Vitals reviewed.  Constitutional:      Appearance: She is normal weight.  HENT:     Head: Normocephalic.     Mouth/Throat:     Mouth: Mucous membranes are moist.     Pharynx: Oropharynx is clear.  Eyes:     Extraocular Movements: Extraocular movements intact.  Cardiovascular:     Rate and Rhythm: Normal rate and regular rhythm.     Heart sounds: Normal heart sounds.  Abdominal:     General: Abdomen is flat.  Skin:    General: Skin is warm.     Capillary Refill: Capillary refill takes less than 2 seconds.  Neurological:     Mental Status: She is alert.      Labs on Admission: I have personally reviewed the patients's labs and imaging studies.  Assessment/Plan Principal Problem:   Alcoholic pancreatitis   # Acute interstitial pancreatitis - Patient endorses alcohol use is most likely etiology of pancreatitis - Imaging and lipase consistent with pancreatitis - Of note patient presentation February with similar complaints.  At that time she was endorsing 5 shots of liquor per day and active tobacco usage Plan: -Encouraged tobacco cessation -Continue IV fluids - Continue analgesia -Continue alcohol cessation  #Hypokalemia- replete potassium   Admission status: Inpatient Telemetry  Certification: The appropriate patient status for this patient is INPATIENT. Inpatient status is judged to be reasonable and necessary in order to provide the required intensity of service  to ensure the patient's safety. The patient's presenting symptoms, physical exam findings, and initial radiographic and laboratory data in the context of their chronic comorbidities is felt to place them at high risk for further clinical deterioration. Furthermore, it is not anticipated that the patient will be medically stable for discharge from the hospital within 2 midnights of admission.   * I certify that at the point of admission it is my clinical judgment that the patient will require inpatient hospital care spanning beyond 2 midnights from the point of admission due to high intensity of service, high risk for further deterioration and high frequency of surveillance required.Alan Mulder MD [facility] If 7PM-7AM, please contact night-coverage [email or web address]  03/01/2024, 5:28 PM

## 2024-03-01 NOTE — Telephone Encounter (Signed)
 Pt scheduled hosp f/up via mychart with Morrie Sheldon on 03/08/24. I changed to an OV, she was not admitted.

## 2024-03-01 NOTE — Progress Notes (Signed)
 Pt refusing tele, Dorrel, MD made aware.

## 2024-03-01 NOTE — ED Provider Notes (Signed)
  Physical Exam  BP (!) 144/104   Pulse 87   Temp 98.7 F (37.1 C) (Oral)   Resp 20   Ht 5\' 3"  (1.6 m)   Wt 65.8 kg   SpO2 98%   BMI 25.69 kg/m   Physical Exam  Procedures  Procedures  ED Course / MDM    Medical Decision Making Amount and/or Complexity of Data Reviewed Labs: ordered. Radiology: ordered.  Risk Prescription drug management.   I, Rosana Berger, assumed care for this patient.  In brief 33 year old female here today with worsening abdominal pain, inability to tolerate p.o.  Patient recently diagnosed with pancreatitis, was discharged with analgesia.  She return for worsening symptoms.  Patient signed out pending CT imaging which did confirm pancreatitis.  Patient hypokalemic, found of a UTI.  Will admit to hospitalist.      Anders Simmonds T, DO 03/01/24 1556

## 2024-03-01 NOTE — ED Triage Notes (Signed)
 BIB EMS from home. Hx of pancreatitis. Says it feels similar to that. Oxy taken 0200

## 2024-03-01 NOTE — Plan of Care (Signed)

## 2024-03-01 NOTE — Transitions of Care (Post Inpatient/ED Visit) (Signed)
   03/01/2024  Name: Audrey Schultz MRN: [id number] DOB: 03-18-1991  Today's TOC FU Call Status: Today's TOC FU Call Status:: Successful TOC FU Call Completed TOC FU Call Complete Date: 03/01/24 Patient's Name and Date of Birth confirmed.  Transition Care Management Follow-up Telephone Call Discharge Facility: [facility]) Type of Discharge: Emergency Department Reason for ED Visit: Other: (Alcohol-induced acute pancreatitis and abdominal pain) How have you been since you were released from the hospital?: Worse Any questions or concerns?: Yes Patient Questions/Concerns:: Pt C/O of abdomial pain and is currently back in the ED on 03/01/2024 Patient Questions/Concerns Addressed: Provided Patient Educational Materials  Items Reviewed: Did you receive and understand the discharge instructions provided?: Yes Medications obtained,verified, and reconciled?: Yes (Medications Reviewed) Any new allergies since your discharge?: No Dietary orders reviewed?: NA Do you have support at home?: No  Medications Reviewed Today: Medications Reviewed Today     Reviewed by Leroy Kennedy, CMA (Certified Medical Assistant) on 03/01/24 at 1059  Med List Status: <None>   Medication Order Taking? Sig Documenting Provider Last Dose Status Informant  cyclobenzaprine (FLEXERIL) 10 MG tablet [id number] Yes Take 0.5-1 tablets (5-10 mg total) by mouth at bedtime. Arabella Merles, PA-C Taking Active   doxycycline (VIBRAMYCIN) 100 MG capsule [id number] Yes Take 1 capsule (100 mg total) by mouth 2 (two) times daily. Warden Fillers, MD Taking Active   ibuprofen (ADVIL) 800 MG tablet [id number] Yes Take 1 tablet (800 mg total) by mouth 3 (three) times daily with meals as needed for headache, moderate pain (pain score 4-6) or cramping. Warden Fillers, MD Taking Active   Iron, Ferrous Sulfate, 325 (65 Fe) MG TABS [id number] Yes Take 325 mg by mouth daily. Salvatore Decent, FNP Taking Active   metroNIDAZOLE (FLAGYL)  500 MG tablet [id number] Yes Take 1 tablet (500 mg total) by mouth 2 (two) times daily. Warden Fillers, MD Taking Active   Multiple Vitamin (MULTIVITAMIN WITH MINERALS) TABS tablet [id number] Yes Take 1 tablet by mouth daily. Maretta Bees, MD Taking Active Self, Pharmacy Records  ondansetron (ZOFRAN-ODT) 4 MG disintegrating tablet [id number] Yes Take 1 tablet (4 mg total) by mouth every 8 (eight) hours as needed for nausea or vomiting. Arabella Merles, PA-C Taking Active   oxyCODONE (ROXICODONE) 5 MG immediate release tablet [id number] Yes Take 1 tablet (5 mg total) by mouth every 4 (four) hours as needed for up to 5 days for severe pain (pain score 7-10). Arabella Merles, PA-C Taking Active   potassium chloride (KLOR-CON) 10 MEQ tablet [id number] Yes Take 1 tablet (10 mEq total) by mouth daily for 5 days. Arabella Merles, PA-C Taking Active             Home Care and Equipment/Supplies: Were Home Health Services Ordered?: NA Any new equipment or medical supplies ordered?: NA  Functional Questionnaire: Do you need assistance with bathing/showering or dressing?: No Do you need assistance with meal preparation?: No Do you need assistance with eating?: No Do you have difficulty maintaining continence: No Do you need assistance with getting out of bed/getting out of a chair/moving?: No Do you have difficulty managing or taking your medications?: No  Follow up appointments reviewed: PCP Follow-up appointment confirmed?: Yes Date of PCP follow-up appointment?: 03/08/24 Follow-up Provider: Salvatore Decent NP Specialist Hospital Follow-up appointment confirmed?: NA Do you need transportation to your follow-up appointment?: No Do you understand care options if your condition(s) worsen?: Yes-patient verbalized understanding    SIGNATURE Jodelle Green, RMA

## 2024-03-01 NOTE — ED Provider Notes (Signed)
 [place] EMERGENCY DEPARTMENT AT [facility] Provider Note   CSN: [id number] Arrival date & time: 03/01/24  1036     History  Chief Complaint  Patient presents with   Abdominal Pain    Audrey Schultz is a 33 y.o. female.  HPI Patient presents for abdominal pain.  Medical history includes anemia, pancreatitis, alcohol use, marijuana use.  She was seen in the ED yesterday for 5 days of upper abdominal pain, nausea, and vomiting.  This was in the setting of ongoing alcohol use.  Yesterday, her lipase was elevated at 146.  She was also found to have hypokalemia.  She was treated with IV fluids, potassium replacement, Zofran, Ativan, fentanyl, Dilaudid.  She was discharged with plan for pain control at home.  Patient reports pain is uncontrolled at home.  She has had ongoing nausea, vomiting, and p.o. intolerance. Current pain is 9/10 in severity.    Home Medications Prior to Admission medications   Medication Sig Start Date End Date Taking? Authorizing Provider  cyclobenzaprine (FLEXERIL) 10 MG tablet Take 0.5-1 tablets (5-10 mg total) by mouth at bedtime. 02/29/24   Arabella Merles, PA-C  doxycycline (VIBRAMYCIN) 100 MG capsule Take 1 capsule (100 mg total) by mouth 2 (two) times daily. 01/08/24   Warden Fillers, MD  ibuprofen (ADVIL) 800 MG tablet Take 1 tablet (800 mg total) by mouth 3 (three) times daily with meals as needed for headache, moderate pain (pain score 4-6) or cramping. 01/06/24   Warden Fillers, MD  Iron, Ferrous Sulfate, 325 (65 Fe) MG TABS Take 325 mg by mouth daily. 12/09/23   Salvatore Decent, FNP  metroNIDAZOLE (FLAGYL) 500 MG tablet Take 1 tablet (500 mg total) by mouth 2 (two) times daily. 01/08/24   Warden Fillers, MD  Multiple Vitamin (MULTIVITAMIN WITH MINERALS) TABS tablet Take 1 tablet by mouth daily. 03/19/22   Ghimire, Werner Lean, MD  ondansetron (ZOFRAN-ODT) 4 MG disintegrating tablet Take 1 tablet (4 mg total) by mouth every 8 (eight) hours as  needed for nausea or vomiting. 02/29/24   Arabella Merles, PA-C  oxyCODONE (ROXICODONE) 5 MG immediate release tablet Take 1 tablet (5 mg total) by mouth every 4 (four) hours as needed for up to 5 days for severe pain (pain score 7-10). 02/29/24 03/05/24  Arabella Merles, PA-C  potassium chloride (KLOR-CON) 10 MEQ tablet Take 1 tablet (10 mEq total) by mouth daily for 5 days. 02/29/24 03/05/24  Arabella Merles, PA-C      Allergies    Shellfish allergy    Review of Systems   Review of Systems  Gastrointestinal:  Positive for abdominal pain, nausea and vomiting.  All other systems reviewed and are negative.   Physical Exam Updated Vital Signs BP (!) 156/110 (BP Location: Right Arm)   Pulse 93   Temp 98.7 F (37.1 C) (Oral)   Resp 20   Ht 5\' 3"  (1.6 m)   Wt 65.8 kg   SpO2 100%   BMI 25.69 kg/m  Physical Exam Vitals and nursing note reviewed.  Constitutional:      General: She is not in acute distress.    Appearance: She is well-developed. She is not ill-appearing, toxic-appearing or diaphoretic.  HENT:     Head: Normocephalic and atraumatic.     Mouth/Throat:     Mouth: Mucous membranes are moist.  Eyes:     Conjunctiva/sclera: Conjunctivae normal.  Cardiovascular:     Rate and Rhythm: Normal rate and regular rhythm.  Pulmonary:     Effort: Pulmonary effort is normal. No respiratory distress.  Abdominal:     Palpations: Abdomen is soft.     Tenderness: There is generalized abdominal tenderness. There is no guarding or rebound.  Musculoskeletal:        General: No swelling.     Cervical back: Neck supple.  Skin:    General: Skin is warm and dry.  Neurological:     General: No focal deficit present.     Mental Status: She is alert and oriented to person, place, and time.  Psychiatric:        Mood and Affect: Mood normal.        Behavior: Behavior normal.     ED Results / Procedures / Treatments   Labs (all labs ordered are listed, but only abnormal results are  displayed) Labs Reviewed  CBC WITH DIFFERENTIAL/PLATELET - Abnormal; Notable for the following components:      Result Value   RBC 3.65 (*)    Hemoglobin 11.9 (*)    HCT 35.8 (*)    All other components within normal limits  URINALYSIS, ROUTINE W REFLEX MICROSCOPIC - Abnormal; Notable for the following components:   APPearance HAZY (*)    Hgb urine dipstick SMALL (*)    Ketones, ur 80 (*)    Protein, ur 30 (*)    Nitrite POSITIVE (*)    Leukocytes,Ua MODERATE (*)    Bacteria, UA FEW (*)    All other components within normal limits  COMPREHENSIVE METABOLIC PANEL WITH GFR - Abnormal; Notable for the following components:   Sodium 133 (*)    Potassium 2.8 (*)    Glucose, Bld 107 (*)    BUN <5 (*)    Creatinine, Ser 0.38 (*)    All other components within normal limits  LIPASE, BLOOD - Abnormal; Notable for the following components:   Lipase 212 (*)    All other components within normal limits  URINE CULTURE  MAGNESIUM    EKG None  Radiology No results found.  Procedures Procedures    Medications Ordered in ED Medications  potassium chloride 10 mEq in 100 mL IVPB (10 mEq Intravenous New Bag/Given 03/01/24 1436)  0.9 %  sodium chloride infusion ( Intravenous New Bag/Given 03/01/24 1431)  magnesium sulfate IVPB 2 g 50 mL (2 g Intravenous New Bag/Given 03/01/24 1452)  lactated ringers bolus 1,000 mL (0 mLs Intravenous Stopped 03/01/24 1308)  metoCLOPramide (REGLAN) injection 10 mg (10 mg Intravenous Given 03/01/24 1110)  HYDROmorphone (DILAUDID) injection 1 mg (1 mg Intravenous Given 03/01/24 1111)  iohexol (OMNIPAQUE) 300 MG/ML solution 100 mL (100 mLs Intravenous Contrast Given 03/01/24 1413)  HYDROmorphone (DILAUDID) injection 1 mg (1 mg Intravenous Given 03/01/24 1338)  cefTRIAXone (ROCEPHIN) 1 g in sodium chloride 0.9 % 100 mL IVPB (0 g Intravenous Stopped 03/01/24 1429)    ED Course/ Medical Decision Making/ A&P                                 Medical Decision  Making Amount and/or Complexity of Data Reviewed Labs: ordered. Radiology: ordered.  Risk Prescription drug management.   This patient presents to the ED for concern of abdominal pain, this involves an extensive number of treatment options, and is a complaint that carries with it a high risk of complications and morbidity.  The differential diagnosis includes pancreatitis, PUD, medication withdrawal, cannabinoid hyperemesis syndrome, dehydration, metabolic derangements  Co morbidities that complicate the patient evaluation  anemia, pancreatitis, alcohol use, marijuana use   Additional history obtained:  Additional history obtained from N/A External records from outside source obtained and reviewed including EMR   Lab Tests:  I Ordered, and personally interpreted labs.  The pertinent results include: Normal kidney function, hypokalemia with otherwise normal electrolytes, baseline anemia, no leukocytosis, increase in lipase from yesterday, urinalysis consistent with UTI.   Imaging Studies ordered:  I ordered imaging studies including CT of abdomen and pelvis I independently visualized and interpreted imaging which showed (pending at time of signout) I agree with the radiologist interpretation   Cardiac Monitoring: / EKG:  The patient was maintained on a cardiac monitor.  I personally viewed and interpreted the cardiac monitored which showed an underlying rhythm of: Sinus rhythm  Problem List / ED Course / Critical interventions / Medication management  Patient presents for worsening of abdominal pain, nausea, and vomiting.  Seen in the ED yesterday for alcohol induced pancreatitis.  On arrival in the ED, vital signs notable for hypertension.  Patient appears uncomfortable on exam.  Current pain is 9/10 in severity.  IV fluids, Reglan, Dilaudid ordered.  Repeat lab work initiated.  Workup shows increasing lipase since yesterday and redemonstration of hypokalemia.  Replacement  potassium was ordered.  Urinalysis also shows evidence of nitrite positive UTI.  Patient was started on ceftriaxone.  She had recurrence of pain while in the ED and additional Dilaudid was ordered.  CT imaging was pending at time of signout.  Care of patient was signed to oncoming ED provider. I ordered medication including IV fluids for hydration; Dilaudid for analgesia; potassium chloride for hypokalemia; Reglan for nausea; ceftriaxone for UTI Reevaluation of the patient after these medicines showed that the patient improved I have reviewed the patients home medicines and have made adjustments as needed   Social Determinants of Health:  Frequent ED visits         Final Clinical Impression(s) / ED Diagnoses Final diagnoses:  Alcohol-induced acute pancreatitis, unspecified complication status    Rx / DC Orders ED Discharge Orders     None         Gloris Manchester, MD 03/01/24 1511

## 2024-03-01 NOTE — ED Notes (Signed)
 Set K+ to alert early so I can get next bag ready before this one runs out

## 2024-03-01 NOTE — Telephone Encounter (Signed)
 It should be a ED visit. Pt was seen for abdominal pain.

## 2024-03-02 ENCOUNTER — Encounter (HOSPITAL_COMMUNITY): Payer: Self-pay | Admitting: Internal Medicine

## 2024-03-02 DIAGNOSIS — K852 Alcohol induced acute pancreatitis without necrosis or infection: Secondary | ICD-10-CM | POA: Diagnosis not present

## 2024-03-02 LAB — CBC
HCT: 33.2 % — ABNORMAL LOW (ref 36.0–46.0)
Hemoglobin: 10.9 g/dL — ABNORMAL LOW (ref 12.0–15.0)
MCH: 32.1 pg (ref 26.0–34.0)
MCHC: 32.8 g/dL (ref 30.0–36.0)
MCV: 97.6 fL (ref 80.0–100.0)
Platelets: 218 10*3/uL (ref 150–400)
RBC: 3.4 MIL/uL — ABNORMAL LOW (ref 3.87–5.11)
RDW: 12.7 % (ref 11.5–15.5)
WBC: 7.7 10*3/uL (ref 4.0–10.5)
nRBC: 0 % (ref 0.0–0.2)

## 2024-03-02 LAB — BASIC METABOLIC PANEL WITH GFR
Anion gap: 11 (ref 5–15)
BUN: <5 mg/dL — ABNORMAL LOW (ref 6–20)
CO2: 24 mmol/L (ref 22–32)
Calcium: 8.9 mg/dL (ref 8.9–10.3)
Chloride: 98 mmol/L (ref 98–111)
Creatinine, Ser: 0.32 mg/dL — ABNORMAL LOW (ref 0.44–1.00)
GFR, Estimated: >60 mL/min (ref >60)
Glucose, Bld: 104 mg/dL — ABNORMAL HIGH (ref 70–99)
Potassium: 3 mmol/L — ABNORMAL LOW (ref 3.5–5.1)
Sodium: 133 mmol/L — ABNORMAL LOW (ref 135–145)

## 2024-03-02 MED ORDER — SODIUM CHLORIDE 0.9 % IV SOLN: INTRAVENOUS | Status: DC

## 2024-03-02 MED ORDER — OXYCODONE HCL 5 MG PO TABS
5.0000 mg | ORAL_TABLET | ORAL | Status: DC | PRN
Start: 2024-03-02 — End: 2024-03-04
  Administered 2024-03-02: 5 mg via ORAL
  Administered 2024-03-03: 10 mg via ORAL
  Filled 2024-03-02: qty 2
  Filled 2024-03-02: qty 1

## 2024-03-02 MED ORDER — HYDROMORPHONE HCL 1 MG/ML IJ SOLN
1.0000 mg | INTRAMUSCULAR | Status: DC | PRN
  Administered 2024-03-02 – 2024-03-03 (×9): 1 mg via INTRAVENOUS
  Filled 2024-03-02 (×9): qty 1

## 2024-03-02 MED ORDER — POTASSIUM CHLORIDE CRYS ER 20 MEQ PO TBCR
40.0000 meq | EXTENDED_RELEASE_TABLET | Freq: Once | ORAL | Status: AC
  Administered 2024-03-02: 40 meq via ORAL
  Filled 2024-03-02: qty 2

## 2024-03-02 NOTE — Progress Notes (Signed)
 PROGRESS NOTE  Audrey Schultz:[id number] DOB: [birth year] DOA: 03/01/2024 PCP: Salvatore Decent, FNP   LOS: 1 day   Brief Narrative / Interim history: 33 year old female with history of EtOH use comes into the hospital with abdominal pain.  She initially presented to the emergency room over the weekend on 3/30 with abdominal pain, nausea, vomiting, was diagnosed with pancreatitis but her pain was controlled, she was able to tolerate p.o. and she was discharged home.  She was home for couple of days, with worsening of her abdominal pain, poor p.o. intake, persistent nausea, vomiting and decided come back to the hospital.  On admission lipase was elevated at 212, and a CT scan of the abdomen pelvis showed acute pancreatitis without drainable fluid collection/abscesses or pseudocyst.  She was admitted to the hospital  Subjective / 24h Interval events: Complains of severe and uncontrolled pain this morning.  No nausea  Assesement and Plan: Principal problem Acute alcoholic pancreatitis - most likely etiology, imaging did not show any gallstones, gallbladder wall thickening or biliary dilatation.  Continue n.p.o. given significant pain, adjust pain medications, antiemetics and IV fluids.  Active problems Alcohol abuse-she tells me she drinks anywhere between 4 and 10 "shots" per day.  She has not been able to drink in the last few days due to feeling sick.  Calmer level is negative, AST and ALT are both normal and she has no evidence of withdrawals at this point.  Continue to monitor  Hypokalemia-replenish potassium  Hyponatremia-monitor, stable  Bacteriuria-no UTI type symptoms  Scheduled Meds:  enoxaparin (LOVENOX) injection  40 mg Subcutaneous Q24H   potassium chloride  40 mEq Oral Once   Continuous Infusions:  sodium chloride 150 mL/hr at 03/02/24 0856   PRN Meds:.acetaminophen **OR** acetaminophen, HYDROmorphone (DILAUDID) injection, ondansetron **OR** ondansetron (ZOFRAN) IV,  oxyCODONE  Current Outpatient Medications  Medication Instructions   cyclobenzaprine (FLEXERIL) 5-10 mg, Oral, Daily at bedtime   doxycycline (VIBRAMYCIN) 100 mg, Oral, 2 times daily   ibuprofen (ADVIL) 800 mg, Oral, 3 times daily with meals PRN   Iron (Ferrous Sulfate) 325 mg, Oral, Daily   metroNIDAZOLE (FLAGYL) 500 mg, Oral, 2 times daily   Multiple Vitamin (MULTIVITAMIN WITH MINERALS) TABS tablet 1 tablet, Oral, Daily   ondansetron (ZOFRAN-ODT) 4 mg, Oral, Every 8 hours PRN   oxyCODONE (ROXICODONE) 5 mg, Oral, Every 4 hours PRN   potassium chloride (KLOR-CON) 10 MEQ tablet 10 mEq, Oral, Daily    Diet Orders (From admission, onward)     Start     Ordered   03/02/24 0940  Diet NPO time specified Except for: Sips with Meds, Ice Chips  Diet effective now       Question Answer Comment  Except for Sips with Meds   Except for Ice Chips      03/02/24 0939            DVT prophylaxis: enoxaparin (LOVENOX) injection 40 mg Start: 03/01/24 2200 SCDs Start: 03/01/24 1727   Lab Results  Component Value Date   PLT 218 03/02/2024      Code Status: Full Code  Family Communication: No family at bedside  Status is: Inpatient Remains inpatient appropriate because: Severity of illness  Level of care: Telemetry  Consultants:  None  Objective: Vitals:   03/01/24 1530 03/01/24 1922 03/01/24 2258 03/02/24 0430  BP: (!) 144/104 (!) 144/106 (!) 157/111 (!) 154/102  Pulse: 87 (!) 107 92 86  Resp:  19 19 19   Temp:  98.5 F (36.9  C) 98.7 F (37.1 C) 98.5 F (36.9 C)  TempSrc:  Oral Oral Oral  SpO2: 98% 100% 100% 100%  Weight:      Height:        Intake/Output Summary (Last 24 hours) at 03/02/2024 0939 Last data filed at 03/02/2024 0309 Gross per 24 hour  Intake 2574.1 ml  Output --  Net 2574.1 ml   Wt Readings from Last 3 Encounters:  03/01/24 65.8 kg  02/29/24 65.8 kg  01/06/24 67.2 kg    Examination:  Constitutional: NAD Eyes: no scleral icterus ENMT: Mucous  membranes are moist.  Neck: normal, supple Respiratory: clear to auscultation bilaterally, no wheezing, no crackles.  Cardiovascular: Regular rate and rhythm, no murmurs / rubs / gallops. No LE edema.  Abdomen: Tender to palpation in the epigastric area, no guarding or rebound Musculoskeletal: no clubbing / cyanosis.    Data Reviewed: I have independently reviewed following labs and imaging studies   CBC Recent Labs  Lab 02/29/24 1139 03/01/24 1045 03/02/24 0535  WBC 8.1 9.1 7.7  HGB 12.9 11.9* 10.9*  HCT 39.0 35.8* 33.2*  PLT 273 274 218  MCV 97.3 98.1 97.6  MCH 32.2 32.6 32.1  MCHC 33.1 33.2 32.8  RDW 13.0 12.8 12.7  LYMPHSABS 0.9 1.3  --   MONOABS 0.3 0.4  --   EOSABS 0.0 0.0  --   BASOSABS 0.0 0.0  --     Recent Labs  Lab 02/29/24 1139 03/01/24 1306 03/02/24 0535  NA 137 133* 133*  K 2.7* 2.8* 3.0*  CL 100 98 98  CO2 25 24 24   GLUCOSE 145* 107* 104*  BUN 6 <5* <5*  CREATININE 0.68 0.38* 0.32*  CALCIUM 9.6 9.0 8.9  AST 22 15  --   ALT 17 12  --   ALKPHOS 72 63  --   BILITOT 0.5 0.4  --   ALBUMIN 4.1 3.7  --   MG 2.0 1.7  --     ------------------------------------------------------------------------------------------------------------------ No results for input(s): "CHOL", "HDL", "LDLCALC", "TRIG", "CHOLHDL", "LDLDIRECT" in the last 72 hours.  No results found for: "HGBA1C" ------------------------------------------------------------------------------------------------------------------ No results for input(s): "TSH", "T4TOTAL", "T3FREE", "THYROIDAB" in the last 72 hours.  Invalid input(s): "FREET3"  Cardiac Enzymes No results for input(s): "CKMB", "TROPONINI", "MYOGLOBIN" in the last 168 hours.  Invalid input(s): "CK" ------------------------------------------------------------------------------------------------------------------ No results found for: "BNP"  CBG: No results for input(s): "GLUCAP" in the last 168 hours.  Recent Results (from  the past 240 hours)  Resp panel by RT-PCR (RSV, Flu A&B, Covid) Anterior Nasal Swab     Status: None   Collection Time: 02/29/24 11:14 AM   Specimen: Anterior Nasal Swab  Result Value Ref Range Status   SARS Coronavirus 2 by RT PCR NEGATIVE NEGATIVE Final   Influenza A by PCR NEGATIVE NEGATIVE Final   Influenza B by PCR NEGATIVE NEGATIVE Final    Comment: (NOTE) The Xpert Xpress SARS-CoV-2/FLU/RSV plus assay is intended as an aid in the diagnosis of influenza from Nasopharyngeal swab specimens and should not be used as a sole basis for treatment. Nasal washings and aspirates are unacceptable for Xpert Xpress SARS-CoV-2/FLU/RSV testing.  Fact Sheet for Patients: BloggerCourse.com  Fact Sheet for Healthcare Providers: SeriousBroker.it  This test is not yet approved or cleared by the [place] FDA and has been authorized for detection and/or diagnosis of SARS-CoV-2 by FDA under an Emergency Use Authorization (EUA). This EUA will remain in effect (meaning this test can be used) for the duration  of the COVID-19 declaration under Section 564(b)(1) of the Act, 21 U.S.C. section 360bbb-3(b)(1), unless the authorization is terminated or revoked.     Resp Syncytial Virus by PCR NEGATIVE NEGATIVE Final    Comment: (NOTE) Fact Sheet for Patients: BloggerCourse.com  Fact Sheet for Healthcare Providers: SeriousBroker.it  This test is not yet approved or cleared by the [place] FDA and has been authorized for detection and/or diagnosis of SARS-CoV-2 by FDA under an Emergency Use Authorization (EUA). This EUA will remain in effect (meaning this test can be used) for the duration of the COVID-19 declaration under Section 564(b)(1) of the Act, 21 U.S.C. section 360bbb-3(b)(1), unless the authorization is terminated or revoked.  Performed at [facility], [address],  [place], [place] [address]      Radiology Studies: CT ABDOMEN PELVIS W CONTRAST Result Date: 03/01/2024 CLINICAL DATA:  Abdominal pain. EXAM: CT ABDOMEN AND PELVIS WITH CONTRAST TECHNIQUE: Multidetector CT imaging of the abdomen and pelvis was performed using the standard protocol following bolus administration of intravenous contrast. RADIATION DOSE REDUCTION: This exam was performed according to the departmental dose-optimization program which includes automated exposure control, adjustment of the mA and/or kV according to patient size and/or use of iterative reconstruction technique. CONTRAST:  OMNIPAQUE IOHEXOL 300 MG/ML  SOLN COMPARISON:  CT abdomen pelvis dated 01/25/2023. FINDINGS: Lower chest: The visualized lung bases are clear. No intra-abdominal free air there is small free fluid in the pelvis. Hepatobiliary: No focal liver abnormality is seen. No gallstones, gallbladder wall thickening, or biliary dilatation. Pancreas: There is inflammatory changes of the pancreas with peripancreatic fluid and edema consistent with acute pancreatitis. No drainable fluid collection/abscess or pseudocyst. No dilatation of the main pancreatic duct. Spleen: Normal in size without focal abnormality. Adrenals/Urinary Tract: The adrenal glands are unremarkable. There is no hydronephrosis on either side. The urinary bladder is grossly unremarkable. Stomach/Bowel: There is no bowel obstruction or active inflammation. The appendix is normal. Vascular/Lymphatic: The abdominal aorta and IVC unremarkable. The splenic vein, SMV and main portal vein are patent. No portal venous gas. There is no adenopathy. Reproductive: The uterus is grossly unremarkable. No suspicious adnexal masses Other: None Musculoskeletal: Right femoral ORIF.  No acute osseous pathology. IMPRESSION: 1. Acute pancreatitis. No drainable fluid collection/abscess or pseudocyst. 2. No bowel obstruction. Normal appendix. Electronically Signed   By: Elgie Collard M.D.   On: 03/01/2024 15:43     Pamella Pert, MD, PhD [facility]  Between 7 am - 7 pm I am available, please contact me via Amion (for emergencies) or Securechat (non urgent messages)  Between 7 pm - 7 am I am not available, please contact night coverage MD/APP via Amion

## 2024-03-02 NOTE — Progress Notes (Signed)
 Met with pt to offer resources for alcohol use. Pt reports that she started using alcohol as a coping mechanism following the loss of her child's father, her mother, brother, and 2 nephews within a 3 year period. Pt is motivated to quit drinking however, declines resources for outpatient or residential treatment. Pt reports having a therapist she sees regularly and reports interest in starting on a medication to help with cravings. CSW encouraged pt to meet with PCP to discuss medications. CSW encouraged pt to reach back out if she decides she would like resources.    03/02/24 1133  TOC Brief Assessment  Insurance and Status Reviewed  Patient has primary care physician Yes  Home environment has been reviewed Single family home  Prior level of function: Independent  Prior/Current Home Services No current home services  Social Drivers of Health Review SDOH reviewed no interventions necessary  Readmission risk has been reviewed Yes  Transition of care needs no transition of care needs at this time

## 2024-03-02 NOTE — Progress Notes (Signed)
 Pt refuses telemetry, states the adhesive from the electrodes damages her skin, Dr. Lafe Garin aware

## 2024-03-03 DIAGNOSIS — E876 Hypokalemia: Secondary | ICD-10-CM

## 2024-03-03 DIAGNOSIS — R8271 Bacteriuria: Secondary | ICD-10-CM | POA: Diagnosis not present

## 2024-03-03 DIAGNOSIS — K852 Alcohol induced acute pancreatitis without necrosis or infection: Secondary | ICD-10-CM | POA: Diagnosis not present

## 2024-03-03 DIAGNOSIS — E871 Hypo-osmolality and hyponatremia: Secondary | ICD-10-CM | POA: Diagnosis not present

## 2024-03-03 DIAGNOSIS — F1010 Alcohol abuse, uncomplicated: Secondary | ICD-10-CM

## 2024-03-03 LAB — CBC
HCT: 36 % (ref 36.0–46.0)
Hemoglobin: 11.6 g/dL — ABNORMAL LOW (ref 12.0–15.0)
MCH: 31.7 pg (ref 26.0–34.0)
MCHC: 32.2 g/dL (ref 30.0–36.0)
MCV: 98.4 fL (ref 80.0–100.0)
Platelets: 254 10*3/uL (ref 150–400)
RBC: 3.66 MIL/uL — ABNORMAL LOW (ref 3.87–5.11)
RDW: 12.6 % (ref 11.5–15.5)
WBC: 6.3 10*3/uL (ref 4.0–10.5)
nRBC: 0 % (ref 0.0–0.2)

## 2024-03-03 LAB — COMPREHENSIVE METABOLIC PANEL WITH GFR
ALT: 12 U/L (ref 0–44)
AST: 15 U/L (ref 15–41)
Albumin: 3.8 g/dL (ref 3.5–5.0)
Alkaline Phosphatase: 73 U/L (ref 38–126)
Anion gap: 11 (ref 5–15)
BUN: <5 mg/dL — ABNORMAL LOW (ref 6–20)
CO2: 23 mmol/L (ref 22–32)
Calcium: 9 mg/dL (ref 8.9–10.3)
Chloride: 98 mmol/L (ref 98–111)
Creatinine, Ser: 0.43 mg/dL — ABNORMAL LOW (ref 0.44–1.00)
GFR, Estimated: >60 mL/min (ref >60)
Glucose, Bld: 85 mg/dL (ref 70–99)
Potassium: 2.9 mmol/L — ABNORMAL LOW (ref 3.5–5.1)
Sodium: 132 mmol/L — ABNORMAL LOW (ref 135–145)
Total Bilirubin: 0.7 mg/dL (ref 0.0–1.2)
Total Protein: 7.6 g/dL (ref 6.5–8.1)

## 2024-03-03 LAB — PHOSPHORUS: Phosphorus: 2.6 mg/dL (ref 2.5–4.6)

## 2024-03-03 LAB — URINE CULTURE: Culture: >=100000 — AB

## 2024-03-03 LAB — MAGNESIUM: Magnesium: 2.1 mg/dL (ref 1.7–2.4)

## 2024-03-03 MED ORDER — ADULT MULTIVITAMIN W/MINERALS CH
1.0000 | ORAL_TABLET | Freq: Every day | ORAL | Status: DC
Start: 2024-03-03 — End: 2024-03-04
  Administered 2024-03-03: 1 via ORAL
  Filled 2024-03-03: qty 1

## 2024-03-03 MED ORDER — POTASSIUM CHLORIDE IN NACL 40-0.9 MEQ/L-% IV SOLN
INTRAVENOUS | Status: DC
  Filled 2024-03-03 (×3): qty 1000

## 2024-03-03 MED ORDER — THIAMINE HCL 100 MG/ML IJ SOLN: 100.0000 mg | Freq: Every day | INTRAMUSCULAR | Status: DC

## 2024-03-03 MED ORDER — FOLIC ACID 1 MG PO TABS
1.0000 mg | ORAL_TABLET | Freq: Every day | ORAL | Status: DC
  Administered 2024-03-03: 1 mg via ORAL
  Filled 2024-03-03: qty 1

## 2024-03-03 MED ORDER — LORAZEPAM 2 MG/ML IJ SOLN: 1.0000 mg | INTRAMUSCULAR | Status: DC | PRN

## 2024-03-03 MED ORDER — THIAMINE MONONITRATE 100 MG PO TABS
100.0000 mg | ORAL_TABLET | Freq: Every day | ORAL | Status: DC
  Administered 2024-03-03: 100 mg via ORAL
  Filled 2024-03-03: qty 1

## 2024-03-03 MED ORDER — LORAZEPAM 1 MG PO TABS
1.0000 mg | ORAL_TABLET | ORAL | Status: DC | PRN
  Administered 2024-03-03: 1 mg via ORAL
  Filled 2024-03-03: qty 1

## 2024-03-03 NOTE — Hospital Course (Addendum)
 33 y.o. female with medical history significant of with history of anemia, alcohol use who to [facility] emergency department on 3/31 with abdominal pain.   Upon evaluation in the emergency department CT imaging revealed evidence of acute pancreatitis.  The hospitalist group was then called to assess the patient for admission to the hospital.    Patient was initially made n.p.o. and hydrated aggressively with intravenous isotonic fluids.  Over the course the hospitalization patient exhibited multiple electrolyte abnormalities including hyponatremia and hypokalemia.  These were both corrected over the course the hospitalization prior to discharge.  Opiate-based analgesics were administered for severe associated pain.  Considering patient's longstanding history of alcohol abuse, patient was initiated on CIWA protocol for as needed benzodiazepines for signs of withdrawal throughout the hospitalization.  Was counseled on cessation of alcohol abuse throughout the hospitalization.  In the days followed patient gradually clinically improved.  Diet was advanced successfully however on the final date of discharge patient was rather sporadically and adamantly requesting to be discharged in short order.  Patient was discharged home in improved and stable condition on 03/03/2024.

## 2024-03-03 NOTE — Discharge Instructions (Signed)
 Please consume a soft diet.  Advance this diet slowly as tolerated.  Avoid significant amounts of greasy or spicy foods.  Please increase your physical activity as tolerated. Please maintain all outpatient follow-up appointments including follow-up with your primary care provider. Please abstain from future alcohol use as this will put you at risk of recurrent disease. Please return to the emergency department if you develop worsening abdominal pain, weakness or inability to tolerate oral intake.

## 2024-03-03 NOTE — Discharge Summary (Signed)
 Physician Discharge Summary   Patient: Audrey Schultz MRN: [id number] DOB: Oct 19, 1991  Admit date:     03/01/2024  Discharge date: 03/03/24  Discharge Physician: Marinda Elk   PCP: Salvatore Decent, FNP   Recommendations at discharge:   Please consume a soft diet.  Advance this diet slowly as tolerated.  Avoid significant amounts of greasy or spicy foods.  Please increase your physical activity as tolerated. Please maintain all outpatient follow-up appointments including follow-up with your primary care provider. Please abstain from future alcohol use as this will put you at risk of recurrent disease. Please return to the emergency department if you develop worsening abdominal pain, weakness or inability to tolerate oral intake.  Discharge Diagnoses: Principal Problem:   Alcoholic pancreatitis Active Problems:   Alcohol abuse   Hyponatremia   Hypokalemia   Asymptomatic bacteriuria  Resolved Problems:   * No resolved hospital problems. *   Hospital Course: 33 y.o. female with medical history significant of with history of anemia, alcohol use who to [facility] emergency department on 3/31 with abdominal pain.   Upon evaluation in the emergency department CT imaging revealed evidence of acute pancreatitis.  The hospitalist group was then called to assess the patient for admission to the hospital.    Patient was initially made n.p.o. and hydrated aggressively with intravenous isotonic fluids.  Over the course the hospitalization patient exhibited multiple electrolyte abnormalities including hyponatremia and hypokalemia.  These were both corrected over the course the hospitalization prior to discharge.  Opiate-based analgesics were administered for severe associated pain.  Considering patient's longstanding history of alcohol abuse, patient was initiated on CIWA protocol for as needed benzodiazepines for signs of withdrawal throughout the hospitalization.  Was counseled  on cessation of alcohol abuse throughout the hospitalization.  In the days followed patient gradually clinically improved.  Diet was advanced successfully however on the final date of discharge patient was rather sporadically and adamantly requesting to be discharged in short order.  Patient was discharged home in improved and stable condition on 03/03/2024.   Consultants: None Procedures performed: none  Disposition: Home Diet recommendation:  Discharge Diet Orders (From admission, onward)     Start     Ordered   03/03/24 0000  Diet general        03/03/24 1930           Regular diet  DISCHARGE MEDICATION: Allergies as of 03/03/2024       Reactions   Shellfish Allergy Anaphylaxis        Medication List     STOP taking these medications    potassium chloride 10 MEQ tablet Commonly known as: KLOR-CON       TAKE these medications    acetaminophen 325 MG tablet Commonly known as: TYLENOL Take 650 mg by mouth every 6 (six) hours as needed for moderate pain (pain score 4-6).   cyclobenzaprine 10 MG tablet Commonly known as: FLEXERIL Take 0.5-1 tablets (5-10 mg total) by mouth at bedtime.   ibuprofen 800 MG tablet Commonly known as: ADVIL Take 1 tablet (800 mg total) by mouth 3 (three) times daily with meals as needed for headache, moderate pain (pain score 4-6) or cramping.   Iron (Ferrous Sulfate) 325 (65 Fe) MG Tabs Take 325 mg by mouth daily.   multivitamin with minerals Tabs tablet Take 1 tablet by mouth daily.   ondansetron 4 MG disintegrating tablet Commonly known as: ZOFRAN-ODT Take 1 tablet (4 mg total) by mouth every 8 (  eight) hours as needed for nausea or vomiting.   oxyCODONE 5 MG immediate release tablet Commonly known as: Roxicodone Take 1 tablet (5 mg total) by mouth every 4 (four) hours as needed for up to 5 days for severe pain (pain score 7-10).        Follow-up Information     Salvatore Decent, FNP Follow up in 1 week(s).   Specialty:  Internal Medicine Contact information: [address] [place] [address] [phone]                 Discharge Exam: Ceasar Mons Weights   03/01/24 1045  Weight: 65.8 kg    Constitutional: Awake alert and oriented x3, no associated distress.   Respiratory: clear to auscultation bilaterally, no wheezing, no crackles. Normal respiratory effort. No accessory muscle use.  Cardiovascular: Regular rate and rhythm, no murmurs / rubs / gallops. No extremity edema. 2+ pedal pulses. No carotid bruits.  Abdomen: Abdomen is soft and nontender.  No evidence of intra-abdominal masses.  Positive bowel sounds noted in all quadrants.   Musculoskeletal: No joint deformity upper and lower extremities. Good ROM, no contractures. Normal muscle tone.     Condition at discharge: fair  The results of significant diagnostics from this hospitalization (including imaging, microbiology, ancillary and laboratory) are listed below for reference.   Imaging Studies: CT ABDOMEN PELVIS W CONTRAST Result Date: 03/01/2024 CLINICAL DATA:  Abdominal pain. EXAM: CT ABDOMEN AND PELVIS WITH CONTRAST TECHNIQUE: Multidetector CT imaging of the abdomen and pelvis was performed using the standard protocol following bolus administration of intravenous contrast. RADIATION DOSE REDUCTION: This exam was performed according to the departmental dose-optimization program which includes automated exposure control, adjustment of the mA and/or kV according to patient size and/or use of iterative reconstruction technique. CONTRAST:  OMNIPAQUE IOHEXOL 300 MG/ML  SOLN COMPARISON:  CT abdomen pelvis dated 01/25/2023. FINDINGS: Lower chest: The visualized lung bases are clear. No intra-abdominal free air there is small free fluid in the pelvis. Hepatobiliary: No focal liver abnormality is seen. No gallstones, gallbladder wall thickening, or biliary dilatation. Pancreas: There is inflammatory changes of the pancreas with  peripancreatic fluid and edema consistent with acute pancreatitis. No drainable fluid collection/abscess or pseudocyst. No dilatation of the main pancreatic duct. Spleen: Normal in size without focal abnormality. Adrenals/Urinary Tract: The adrenal glands are unremarkable. There is no hydronephrosis on either side. The urinary bladder is grossly unremarkable. Stomach/Bowel: There is no bowel obstruction or active inflammation. The appendix is normal. Vascular/Lymphatic: The abdominal aorta and IVC unremarkable. The splenic vein, SMV and main portal vein are patent. No portal venous gas. There is no adenopathy. Reproductive: The uterus is grossly unremarkable. No suspicious adnexal masses Other: None Musculoskeletal: Right femoral ORIF.  No acute osseous pathology. IMPRESSION: 1. Acute pancreatitis. No drainable fluid collection/abscess or pseudocyst. 2. No bowel obstruction. Normal appendix. Electronically Signed   By: Elgie Collard M.D.   On: 03/01/2024 15:43    Microbiology: Results for orders placed or performed during the hospital encounter of 03/01/24  Urine Culture     Status: Abnormal   Collection Time: 03/01/24  1:14 PM   Specimen: Urine, Clean Catch  Result Value Ref Range Status   Specimen Description   Final    URINE, CLEAN CATCH Performed at [facility], [address], [place], [place] [address]    Special Requests   Final    NONE Performed at [facility], [address] [place], [place]  [address]    Culture >=100,000 COLONIES/mL KLEBSIELLA PNEUMONIAE (A)  Final   Report Status 03/03/2024 FINAL  Final   Organism ID, Bacteria KLEBSIELLA PNEUMONIAE (A)  Final      Susceptibility   Klebsiella pneumoniae - MIC*    AMPICILLIN RESISTANT Resistant     CEFAZOLIN <=4 SENSITIVE Sensitive     CEFEPIME <=0.12 SENSITIVE Sensitive     CEFTRIAXONE <=0.25 SENSITIVE Sensitive     CIPROFLOXACIN <=0.25 SENSITIVE Sensitive     GENTAMICIN <=1 SENSITIVE  Sensitive     IMIPENEM <=0.25 SENSITIVE Sensitive     NITROFURANTOIN 64 INTERMEDIATE Intermediate     TRIMETH/SULFA <=20 SENSITIVE Sensitive     AMPICILLIN/SULBACTAM 4 SENSITIVE Sensitive     PIP/TAZO <=4 SENSITIVE Sensitive ug/mL    * >=100,000 COLONIES/mL KLEBSIELLA PNEUMONIAE    Labs: CBC: Recent Labs  Lab 02/29/24 1139 03/01/24 1045 03/02/24 0535 03/03/24 0525  WBC 8.1 9.1 7.7 6.3  NEUTROABS 6.9 7.4  --   --   HGB 12.9 11.9* 10.9* 11.6*  HCT 39.0 35.8* 33.2* 36.0  MCV 97.3 98.1 97.6 98.4  PLT 273 274 218 254   Basic Metabolic Panel: Recent Labs  Lab 02/29/24 1139 03/01/24 1306 03/02/24 0535 03/03/24 0525  NA 137 133* 133* 132*  K 2.7* 2.8* 3.0* 2.9*  CL 100 98 98 98  CO2 25 24 24 23   GLUCOSE 145* 107* 104* 85  BUN 6 <5* <5* <5*  CREATININE 0.68 0.38* 0.32* 0.43*  CALCIUM 9.6 9.0 8.9 9.0  MG 2.0 1.7  --  2.1  PHOS  --   --   --  2.6   Liver Function Tests: Recent Labs  Lab 02/29/24 1139 03/01/24 1306 03/03/24 0525  AST 22 15 15   ALT 17 12 12   ALKPHOS 72 63 73  BILITOT 0.5 0.4 0.7  PROT 7.7 6.8 7.6  ALBUMIN 4.1 3.7 3.8   CBG: No results for input(s): "GLUCAP" in the last 168 hours.  Discharge time spent: greater than 30 minutes.  Signed: Marinda Elk, MD [facility] 03/03/2024

## 2024-03-03 NOTE — Plan of Care (Signed)
  Problem: Education: Goal: Knowledge of General Education information will improve Description: Including pain rating scale, medication(s)/side effects and non-pharmacologic comfort measures Outcome: Progressing   Problem: Clinical Measurements: Goal: Diagnostic test results will improve Outcome: Progressing   Problem: Coping: Goal: Level of anxiety will decrease Outcome: Progressing   Problem: Elimination: Goal: Will not experience complications related to bowel motility Outcome: Progressing   Problem: Pain Managment: Goal: General experience of comfort will improve and/or be controlled Outcome: Progressing   Problem: Safety: Goal: Ability to remain free from injury will improve Outcome: Progressing

## 2024-03-03 NOTE — Plan of Care (Signed)

## 2024-03-03 NOTE — Progress Notes (Incomplete)
 PROGRESS NOTE   Audrey Schultz  ZOX:[id number] DOB: 10/11/1991 DOA: 03/01/2024 PCP: Salvatore Decent, FNP   Date of Service: the patient was seen and examined on 03/03/2024  Brief Narrative:  33 y.o. female with medical history significant of with history of anemia, alcohol use who to [facility] emergency department on 3/31 with abdominal pain.   Upon evaluation in the emergency department CT imaging revealed evidence of acute pancreatitis.  The hospitalist group was then called to assess the patient for admission to the hospital.     Assessment & Plan Alcoholic pancreatitis   Acute alcoholic pancreatitis - most likely etiology, imaging did not show any gallstones, gallbladder wall thickening or biliary dilatation.  Continue n.p.o. given significant pain, adjust pain medications, antiemetics and IV fluids.   Active problems Alcohol abuse-she tells me she drinks anywhere between 4 and 10 "shots" per day.  She has not been able to drink in the last few days due to feeling sick.  Calmer level is negative, AST and ALT are both normal and she has no evidence of withdrawals at this point.  Continue to monitor   Hypokalemia-replenish potassium   Hyponatremia-monitor, stable   Bacteriuria-no UTI type symptoms   Subjective:  ***  Physical Exam:  Vitals:   03/02/24 0430 03/02/24 1218 03/02/24 1923 03/03/24 0445  BP: (!) 154/102 (!) 141/100 (!) 136/93 (!) 157/109  Pulse: 86 92 95 77  Resp: 19 18 18 18   Temp: 98.5 F (36.9 C) 98.3 F (36.8 C) 98.6 F (37 C) 98.8 F (37.1 C)  TempSrc: Oral  Oral Oral  SpO2: 100% 97% 100% 97%  Weight:      Height:        *** Constitutional: Awake alert and oriented x3, no associated distress.   Skin: no rashes, no lesions, good skin turgor noted. Eyes: Pupils are equally reactive to light.  No evidence of scleral icterus or conjunctival pallor.  ENMT: Moist mucous membranes noted.  Posterior pharynx clear of any exudate or lesions.    Respiratory: clear to auscultation bilaterally, no wheezing, no crackles. Normal respiratory effort. No accessory muscle use.  Cardiovascular: Regular rate and rhythm, no murmurs / rubs / gallops. No extremity edema. 2+ pedal pulses. No carotid bruits.  Abdomen: Abdomen is soft and nontender.  No evidence of intra-abdominal masses.  Positive bowel sounds noted in all quadrants.   Musculoskeletal: No joint deformity upper and lower extremities. Good ROM, no contractures. Normal muscle tone.    Data Reviewed:  I have personally reviewed and interpreted labs, imaging.  Significant findings are ***  CBC: Recent Labs  Lab 02/29/24 1139 03/01/24 1045 03/02/24 0535 03/03/24 0525  WBC 8.1 9.1 7.7 6.3  NEUTROABS 6.9 7.4  --   --   HGB 12.9 11.9* 10.9* 11.6*  HCT 39.0 35.8* 33.2* 36.0  MCV 97.3 98.1 97.6 98.4  PLT 273 274 218 254   Basic Metabolic Panel: Recent Labs  Lab 02/29/24 1139 03/01/24 1306 03/02/24 0535 03/03/24 0525  NA 137 133* 133* 132*  K 2.7* 2.8* 3.0* 2.9*  CL 100 98 98 98  CO2 25 24 24 23   GLUCOSE 145* 107* 104* 85  BUN 6 <5* <5* <5*  CREATININE 0.68 0.38* 0.32* 0.43*  CALCIUM 9.6 9.0 8.9 9.0  MG 2.0 1.7  --  2.1  PHOS  --   --   --  2.6   GFR: Estimated Creatinine Clearance: 93 mL/min (A) (by C-G formula based on SCr of 0.43  mg/dL (L)). Liver Function Tests: Recent Labs  Lab 02/29/24 1139 03/01/24 1306 03/03/24 0525  AST 22 15 15   ALT 17 12 12   ALKPHOS 72 63 73  BILITOT 0.5 0.4 0.7  PROT 7.7 6.8 7.6  ALBUMIN 4.1 3.7 3.8    Coagulation Profile: No results for input(s): "INR", "PROTIME" in the last 168 hours.   EKG/Telemetry: Personally reviewed.  Rhythm is *** with heart rate of ***.  No dynamic ST segment changes appreciated.   Code Status:  {Palliative Code status:23503}.  Code status decision has been confirmed with: *** Family Communication: ***    Severity of Illness:  {Observation/Inpatient:21159}  Time spent:  ***  minutes  Author:  Marinda Elk MD  03/03/2024 7:29 AM

## 2024-03-03 NOTE — Plan of Care (Signed)

## 2024-03-08 ENCOUNTER — Inpatient Hospital Stay: Admitting: Internal Medicine

## 2024-03-10 ENCOUNTER — Ambulatory Visit (INDEPENDENT_AMBULATORY_CARE_PROVIDER_SITE_OTHER): Admitting: Internal Medicine

## 2024-03-10 ENCOUNTER — Encounter: Payer: Self-pay | Admitting: Internal Medicine

## 2024-03-10 VITALS — BP 102/69 | HR 98 | Temp 97.6°F | Resp 18 | Wt 134.2 lb

## 2024-03-10 DIAGNOSIS — R1033 Periumbilical pain: Secondary | ICD-10-CM

## 2024-03-10 DIAGNOSIS — E876 Hypokalemia: Secondary | ICD-10-CM | POA: Diagnosis not present

## 2024-03-10 DIAGNOSIS — K852 Alcohol induced acute pancreatitis without necrosis or infection: Secondary | ICD-10-CM | POA: Diagnosis not present

## 2024-03-10 DIAGNOSIS — E871 Hypo-osmolality and hyponatremia: Secondary | ICD-10-CM | POA: Diagnosis not present

## 2024-03-10 DIAGNOSIS — K5903 Drug induced constipation: Secondary | ICD-10-CM

## 2024-03-10 LAB — POC URINALSYSI DIPSTICK (AUTOMATED)
Bilirubin, UA: NEGATIVE
Blood, UA: NEGATIVE
Glucose, UA: NEGATIVE
Ketones, UA: NEGATIVE
Leukocytes, UA: NEGATIVE
Nitrite, UA: NEGATIVE
Protein, UA: POSITIVE — AB
Spec Grav, UA: 1.02 (ref 1.010–1.025)
Urobilinogen, UA: 0.2 U/dL
pH, UA: 6 (ref 5.0–8.0)

## 2024-03-10 LAB — POCT URINE PREGNANCY: Preg Test, Ur: NEGATIVE

## 2024-03-10 MED ORDER — DOCUSATE SODIUM 100 MG PO CAPS: 100.0000 mg | ORAL_CAPSULE | Freq: Two times a day (BID) | ORAL | 0 refills | Status: AC

## 2024-03-10 MED ORDER — POLYETHYLENE GLYCOL 3350 17 GM/SCOOP PO POWD: 17.0000 g | Freq: Every day | ORAL | 1 refills | Status: AC | PRN
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
## 2024-03-11 LAB — CBC WITH DIFFERENTIAL/PLATELET
Basophils Absolute: 0 10*3/uL (ref 0.0–0.1)
Basophils Relative: 0.8 % (ref 0.0–3.0)
Eosinophils Absolute: 0 10*3/uL (ref 0.0–0.7)
Eosinophils Relative: 0.6 % (ref 0.0–5.0)
HCT: 39.4 % (ref 36.0–46.0)
Hemoglobin: 12.6 g/dL (ref 12.0–15.0)
Lymphocytes Relative: 38.3 % (ref 12.0–46.0)
Lymphs Abs: 2.2 10*3/uL (ref 0.7–4.0)
MCHC: 32 g/dL (ref 30.0–36.0)
MCV: 101.5 fl — ABNORMAL HIGH (ref 78.0–100.0)
Monocytes Absolute: 0.4 10*3/uL (ref 0.1–1.0)
Monocytes Relative: 6.7 % (ref 3.0–12.0)
Neutro Abs: 3.1 10*3/uL (ref 1.4–7.7)
Neutrophils Relative %: 53.6 % (ref 43.0–77.0)
Platelets: 566 10*3/uL — ABNORMAL HIGH (ref 150.0–400.0)
RBC: 3.88 Mil/uL (ref 3.87–5.11)
RDW: 14.1 % (ref 11.5–15.5)
WBC: 5.7 10*3/uL (ref 4.0–10.5)

## 2024-03-11 LAB — COMPREHENSIVE METABOLIC PANEL WITH GFR
ALT: 19 U/L (ref 0–35)
AST: 22 U/L (ref 0–37)
Albumin: 4.9 g/dL (ref 3.5–5.2)
Alkaline Phosphatase: 78 U/L (ref 39–117)
BUN: 29 mg/dL — ABNORMAL HIGH (ref 6–23)
CO2: 31 meq/L (ref 19–32)
Calcium: 11.3 mg/dL — ABNORMAL HIGH (ref 8.4–10.5)
Chloride: 95 meq/L — ABNORMAL LOW (ref 96–112)
Creatinine, Ser: 1.04 mg/dL (ref 0.40–1.20)
GFR: 71.83 mL/min (ref >60.00)
Glucose, Bld: 88 mg/dL (ref 70–99)
Potassium: 4.4 meq/L (ref 3.5–5.1)
Sodium: 136 meq/L (ref 135–145)
Total Bilirubin: 0.3 mg/dL (ref 0.2–1.2)
Total Protein: 7.9 g/dL (ref 6.0–8.3)

## 2024-03-11 LAB — LIPASE: Lipase: 184 U/L — ABNORMAL HIGH (ref 11.0–59.0)

## 2024-03-16 ENCOUNTER — Telehealth: Payer: Self-pay | Admitting: Internal Medicine

## 2024-03-16 NOTE — Telephone Encounter (Signed)
 03/08/2024 same day cancel/transportation, no transportation needs identified in SDOH, do you want to count as missed visit?  12/10/2023 no show  Please advise.

## 2024-03-17 NOTE — Telephone Encounter (Signed)
 Yes, count as missed visit

## 2024-03-18 ENCOUNTER — Ambulatory Visit: Payer: Medicaid Other | Admitting: Internal Medicine

## 2024-03-22 ENCOUNTER — Encounter: Payer: Self-pay | Admitting: Internal Medicine

## 2024-03-22 NOTE — Telephone Encounter (Signed)
Sent final warning letter via mail and [employer]

## 2024-03-22 NOTE — Telephone Encounter (Signed)
 Provider aware

## 2024-03-30 ENCOUNTER — Other Ambulatory Visit: Payer: Self-pay

## 2024-03-30 ENCOUNTER — Ambulatory Visit: Payer: Medicaid Other

## 2024-03-30 VITALS — BP 135/80 | HR 79 | Wt 134.0 lb

## 2024-03-30 DIAGNOSIS — Z3042 Encounter for surveillance of injectable contraceptive: Secondary | ICD-10-CM | POA: Diagnosis not present

## 2024-03-30 MED ORDER — MEDROXYPROGESTERONE ACETATE 150 MG/ML IM SUSP
150.0000 mg | Freq: Once | INTRAMUSCULAR | Status: AC
  Administered 2024-03-30: 150 mg via INTRAMUSCULAR

## 2024-03-30 MED ORDER — MEDROXYPROGESTERONE ACETATE 150 MG/ML IM SUSP: 150.0000 mg | INTRAMUSCULAR | 3 refills | Status: AC

## 2024-03-30 MED ORDER — MEDROXYPROGESTERONE ACETATE 150 MG/ML IM SUSP: 150.0000 mg | INTRAMUSCULAR | 0 refills | Status: DC

## 2024-03-30 NOTE — Progress Notes (Signed)
 Date last pap: 01/06/24. Last Depo-Provera : 01/06/24. Side Effects if any: NA. Serum HCG indicated? NA. Depo-Provera  150 mg IM given by: Artemio Bilberry, RN. Next appointment due July 15-29 2025.

## 2024-05-07 ENCOUNTER — Inpatient Hospital Stay (HOSPITAL_COMMUNITY)
Admission: EM | Admit: 2024-05-07 | Discharge: 2024-05-07 | DRG: 897 | Disposition: A | Discharge status: Home / Self Care | Attending: Internal Medicine | Admitting: Internal Medicine

## 2024-05-07 ENCOUNTER — Other Ambulatory Visit: Payer: Self-pay

## 2024-05-07 ENCOUNTER — Encounter (HOSPITAL_COMMUNITY): Payer: Self-pay

## 2024-05-07 DIAGNOSIS — E876 Hypokalemia: Secondary | ICD-10-CM | POA: Diagnosis present

## 2024-05-07 DIAGNOSIS — T7621XA Adult sexual abuse, suspected, initial encounter: Secondary | ICD-10-CM | POA: Diagnosis present

## 2024-05-07 DIAGNOSIS — F10231 Alcohol dependence with withdrawal delirium: Principal | ICD-10-CM | POA: Diagnosis present

## 2024-05-07 DIAGNOSIS — Z8249 Family history of ischemic heart disease and other diseases of the circulatory system: Secondary | ICD-10-CM | POA: Diagnosis not present

## 2024-05-07 DIAGNOSIS — E8729 Other acidosis: Secondary | ICD-10-CM | POA: Diagnosis present

## 2024-05-07 DIAGNOSIS — F10931 Alcohol use, unspecified with withdrawal delirium: Secondary | ICD-10-CM | POA: Diagnosis present

## 2024-05-07 DIAGNOSIS — F10930 Alcohol use, unspecified with withdrawal, uncomplicated: Principal | ICD-10-CM

## 2024-05-07 DIAGNOSIS — R7401 Elevation of levels of liver transaminase levels: Secondary | ICD-10-CM | POA: Diagnosis present

## 2024-05-07 DIAGNOSIS — F17210 Nicotine dependence, cigarettes, uncomplicated: Secondary | ICD-10-CM | POA: Diagnosis present

## 2024-05-07 LAB — URINALYSIS, ROUTINE W REFLEX MICROSCOPIC
Bilirubin Urine: NEGATIVE
Glucose, UA: NEGATIVE mg/dL
Ketones, ur: 80 mg/dL — AB
Leukocytes,Ua: NEGATIVE
Nitrite: NEGATIVE
Protein, ur: 30 mg/dL — AB
Specific Gravity, Urine: 1.017 (ref 1.005–1.030)
pH: 5 (ref 5.0–8.0)

## 2024-05-07 LAB — COMPREHENSIVE METABOLIC PANEL WITH GFR
ALT: 26 U/L (ref 0–44)
AST: 59 U/L — ABNORMAL HIGH (ref 15–41)
Albumin: 4.8 g/dL (ref 3.5–5.0)
Alkaline Phosphatase: 90 U/L (ref 38–126)
Anion gap: 19 — ABNORMAL HIGH (ref 5–15)
BUN: 11 mg/dL (ref 6–20)
CO2: 16 mmol/L — ABNORMAL LOW (ref 22–32)
Calcium: 8.8 mg/dL — ABNORMAL LOW (ref 8.9–10.3)
Chloride: 105 mmol/L (ref 98–111)
Creatinine, Ser: 0.71 mg/dL (ref 0.44–1.00)
GFR, Estimated: >60 mL/min (ref >60)
Glucose, Bld: 67 mg/dL — ABNORMAL LOW (ref 70–99)
Potassium: 3.1 mmol/L — ABNORMAL LOW (ref 3.5–5.1)
Sodium: 140 mmol/L (ref 135–145)
Total Bilirubin: 0.9 mg/dL (ref 0.0–1.2)
Total Protein: 8.5 g/dL — ABNORMAL HIGH (ref 6.5–8.1)

## 2024-05-07 LAB — CBC WITH DIFFERENTIAL/PLATELET
Abs Immature Granulocytes: 0.04 10*3/uL (ref 0.00–0.07)
Basophils Absolute: 0 10*3/uL (ref 0.0–0.1)
Basophils Relative: 0 %
Eosinophils Absolute: 0 10*3/uL (ref 0.0–0.5)
Eosinophils Relative: 0 %
HCT: 36.5 % (ref 36.0–46.0)
Hemoglobin: 11.5 g/dL — ABNORMAL LOW (ref 12.0–15.0)
Immature Granulocytes: 0 %
Lymphocytes Relative: 33 %
Lymphs Abs: 3.5 10*3/uL (ref 0.7–4.0)
MCH: 30.9 pg (ref 26.0–34.0)
MCHC: 31.5 g/dL (ref 30.0–36.0)
MCV: 98.1 fL (ref 80.0–100.0)
Monocytes Absolute: 0.4 10*3/uL (ref 0.1–1.0)
Monocytes Relative: 4 %
Neutro Abs: 6.5 10*3/uL (ref 1.7–7.7)
Neutrophils Relative %: 63 %
Platelets: 267 10*3/uL (ref 150–400)
RBC: 3.72 MIL/uL — ABNORMAL LOW (ref 3.87–5.11)
RDW: 16.1 % — ABNORMAL HIGH (ref 11.5–15.5)
WBC: 10.5 10*3/uL (ref 4.0–10.5)
nRBC: 0 % (ref 0.0–0.2)

## 2024-05-07 LAB — RAPID HIV SCREEN (HIV 1/2 AB+AG)
HIV 1/2 Antibodies: NONREACTIVE
HIV-1 P24 Antigen - HIV24: NONREACTIVE

## 2024-05-07 LAB — HEPATITIS B SURFACE ANTIGEN: Hepatitis B Surface Ag: NONREACTIVE

## 2024-05-07 LAB — HEPATITIS C ANTIBODY: HCV Ab: NONREACTIVE

## 2024-05-07 LAB — ETHANOL: Alcohol, Ethyl (B): 109 mg/dL — ABNORMAL HIGH (ref <15)

## 2024-05-07 LAB — RPR: RPR Ser Ql: NONREACTIVE

## 2024-05-07 LAB — HCG, SERUM, QUALITATIVE: Preg, Serum: NEGATIVE

## 2024-05-07 LAB — TROPONIN I (HIGH SENSITIVITY): Troponin I (High Sensitivity): 5 ng/L (ref <18)

## 2024-05-07 LAB — LIPASE, BLOOD: Lipase: 20 U/L (ref 11–51)

## 2024-05-07 MED ORDER — POLYETHYLENE GLYCOL 3350 17 GM/SCOOP PO POWD: 17.0000 g | Freq: Every day | ORAL | Status: DC | PRN

## 2024-05-07 MED ORDER — THIAMINE MONONITRATE 100 MG PO TABS: 100.0000 mg | ORAL_TABLET | Freq: Every day | ORAL | Status: DC

## 2024-05-07 MED ORDER — LORAZEPAM 1 MG PO TABS: 1.0000 mg | ORAL_TABLET | Freq: Four times a day (QID) | ORAL | 0 refills | Status: AC | PRN

## 2024-05-07 MED ORDER — SODIUM CHLORIDE 0.9 % IV BOLUS
1000.0000 mL | Freq: Once | INTRAVENOUS | Status: AC
  Administered 2024-05-07: 1000 mL via INTRAVENOUS

## 2024-05-07 MED ORDER — POLYETHYLENE GLYCOL 3350 17 G PO PACK: 17.0000 g | PACK | Freq: Every day | ORAL | Status: DC | PRN

## 2024-05-07 MED ORDER — FOLIC ACID 1 MG PO TABS: 1.0000 mg | ORAL_TABLET | Freq: Every day | ORAL | Status: DC

## 2024-05-07 MED ORDER — MAGNESIUM OXIDE -MG SUPPLEMENT 400 (240 MG) MG PO TABS: 400.0000 mg | ORAL_TABLET | Freq: Every day | ORAL | Status: DC

## 2024-05-07 MED ORDER — ONDANSETRON HCL 4 MG/2ML IJ SOLN: 4.0000 mg | Freq: Four times a day (QID) | INTRAMUSCULAR | Status: DC | PRN

## 2024-05-07 MED ORDER — ONDANSETRON HCL 4 MG PO TABS: 4.0000 mg | ORAL_TABLET | Freq: Four times a day (QID) | ORAL | Status: DC | PRN

## 2024-05-07 MED ORDER — ADULT MULTIVITAMIN W/MINERALS CH: 1.0000 | ORAL_TABLET | Freq: Every day | ORAL | Status: DC

## 2024-05-07 MED ORDER — POTASSIUM CHLORIDE CRYS ER 20 MEQ PO TBCR
40.0000 meq | EXTENDED_RELEASE_TABLET | Freq: Once | ORAL | Status: AC
  Administered 2024-05-07: 40 meq via ORAL
  Filled 2024-05-07: qty 2

## 2024-05-07 MED ORDER — LORAZEPAM 2 MG/ML IJ SOLN
1.0000 mg | Freq: Once | INTRAMUSCULAR | Status: AC
  Administered 2024-05-07: 1 mg via INTRAVENOUS
  Filled 2024-05-07: qty 1

## 2024-05-07 MED ORDER — IBUPROFEN 200 MG PO TABS: 400.0000 mg | ORAL_TABLET | Freq: Four times a day (QID) | ORAL | Status: DC | PRN

## 2024-05-07 MED ORDER — ENOXAPARIN SODIUM 40 MG/0.4ML IJ SOSY: 40.0000 mg | PREFILLED_SYRINGE | INTRAMUSCULAR | Status: DC

## 2024-05-07 MED ORDER — ALBUTEROL SULFATE (2.5 MG/3ML) 0.083% IN NEBU: 2.5000 mg | INHALATION_SOLUTION | RESPIRATORY_TRACT | Status: DC | PRN

## 2024-05-07 MED ORDER — LORAZEPAM 1 MG PO TABS
1.0000 mg | ORAL_TABLET | ORAL | Status: DC | PRN
  Administered 2024-05-07: 1 mg via ORAL
  Filled 2024-05-07: qty 2

## 2024-05-07 MED ORDER — THIAMINE HCL 100 MG/ML IJ SOLN: 100.0000 mg | Freq: Every day | INTRAMUSCULAR | Status: DC

## 2024-05-07 MED ORDER — LORAZEPAM 2 MG/ML IJ SOLN
2.0000 mg | Freq: Once | INTRAMUSCULAR | Status: DC
  Filled 2024-05-07: qty 1

## 2024-05-07 MED ORDER — TRAZODONE HCL 100 MG PO TABS: 50.0000 mg | ORAL_TABLET | Freq: Every evening | ORAL | Status: DC | PRN

## 2024-05-07 MED ORDER — LACTATED RINGERS IV BOLUS
1000.0000 mL | Freq: Once | INTRAVENOUS | Status: AC
  Administered 2024-05-07: 1000 mL via INTRAVENOUS

## 2024-05-07 MED ORDER — LORAZEPAM 2 MG/ML IJ SOLN: 1.0000 mg | INTRAMUSCULAR | Status: DC | PRN
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
## 2024-05-07 NOTE — ED Notes (Signed)
 The patient is refusing all treatment-states she wants to be discharged. She has removed her IV. The provider has been notified

## 2024-05-07 NOTE — H&P (Signed)
 History and Physical  Audrey Schultz NFA:[id number] DOB: 1991-05-14 DOA: 05/07/2024  PCP: Gavin Kast, FNP   Chief Complaint: Tremors  HPI: Audrey Schultz is a 33 y.o. female with medical history significant for nicotine  dependence, daily alcohol  abuse being admitted to the hospital via jail with concern for alcohol  withdrawal delirium.  Patient was taken to jail yesterday, says that when she had her last drink.  She admits to feeling anxious, but denies hallucinations, nausea, vomiting, pain.  She received Ativan  in the emergency department now says that she feels more relaxed but feels "blah."  Denies any prior history of severe alcohol  withdrawal.   Review of Systems: Please see HPI for pertinent positives and negatives. A complete 10 system review of systems are otherwise negative.  Past Medical History:  Diagnosis Date   Anemia    hx w/pregnancy   Dysmenorrhea    Medical history non-contributory    Nicotine  dependence 01/10/2020   Vitamin D  deficiency 01/10/2020   Past Surgical History:  Procedure Laterality Date   INTRAMEDULLARY (IM) NAIL INTERTROCHANTERIC Right 01/07/2020   Procedure: INTRAMEDULLARY (IM) NAIL INTERTROCHANTRIC;  Surgeon: Hardy Lia, MD;  Location: [facility] OR;  Service: Orthopedics;  Laterality: Right;   ORIF ANKLE FRACTURE Left 01/20/2020   Procedure: OPEN REDUCTION INTERNAL FIXATION (ORIF) ANKLE FRACTURE;  Surgeon: Hardy Lia, MD;  Location: [facility] OR;  Service: Orthopedics;  Laterality: Left;   Social History:  reports that she has been smoking cigarettes. She has a 2.8 pack-year smoking history. She has never used smokeless tobacco. She reports current alcohol  use of about 4.0 - 5.0 standard drinks of alcohol  per week. She reports current drug use. Drug: Marijuana.  Allergies  Allergen Reactions   Shellfish Allergy Anaphylaxis    Family History  Problem Relation Age of Onset   Hypertension Mother    Diabetes Mother      Prior to Admission medications    Medication Sig Start Date End Date Taking? Authorizing Provider  acetaminophen  (TYLENOL ) 325 MG tablet Take 650 mg by mouth every 6 (six) hours as needed for moderate pain (pain score 4-6).    [provider]  cyclobenzaprine  (FLEXERIL ) 10 MG tablet Take 0.5-1 tablets (5-10 mg total) by mouth at bedtime. 02/29/24   Rexie Catena, PA-C  docusate sodium  (COLACE) 100 MG capsule Take 1 capsule (100 mg total) by mouth 2 (two) times daily. 03/10/24   Gavin Kast, FNP  ibuprofen  (ADVIL ) 800 MG tablet Take 1 tablet (800 mg total) by mouth 3 (three) times daily with meals as needed for headache, moderate pain (pain score 4-6) or cramping. 01/06/24   Abigail Abler, MD  Iron , Ferrous Sulfate , 325 (65 Fe) MG TABS Take 325 mg by mouth daily. Patient not taking: Reported on 03/30/2024 12/09/23   Gavin Kast, FNP  medroxyPROGESTERone  (DEPO-PROVERA ) 150 MG/ML injection Inject 1 mL (150 mg total) into the muscle every 3 (three) months. 06/13/24   Arnold, James G, MD  Multiple Vitamin (MULTIVITAMIN WITH MINERALS) TABS tablet Take 1 tablet by mouth daily. 03/19/22   Ghimire, Estil Heman, MD  ondansetron  (ZOFRAN -ODT) 4 MG disintegrating tablet Take 1 tablet (4 mg total) by mouth every 8 (eight) hours as needed for nausea or vomiting. 02/29/24   Rexie Catena, PA-C  polyethylene glycol powder (GLYCOLAX /MIRALAX ) 17 GM/SCOOP powder Take 17 g by mouth daily as needed for moderate constipation. 03/10/24   Gavin Kast, FNP    Physical Exam: BP 128/82 (BP Location: Right Arm)   Pulse (!) 116  Temp 98.5 F (36.9 C) (Oral)   Resp 16   Ht 5\' 3"  (1.6 m)   Wt 61.7 kg   SpO2 100%   BMI 24.09 kg/m  General:  Alert, oriented, calm, in no acute distress  Eyes: EOMI, clear conjuctivae, white sclerea Neck: supple, no masses, trachea mildline  Cardiovascular: RRR, no murmurs or rubs, no peripheral edema  Respiratory: clear to auscultation bilaterally, no wheezes, no crackles  Abdomen: soft, nontender,  nondistended, normal bowel tones heard  Skin: dry, no rashes  Musculoskeletal: no joint effusions, normal range of motion  Psychiatric: appropriate affect, normal speech  Neurologic: extraocular muscles intact, clear speech, moving all extremities with intact sensorium         Labs on Admission:  Basic Metabolic Panel: Recent Labs  Lab 05/07/24 0407  NA 140  K 3.1*  CL 105  CO2 16*  GLUCOSE 67*  BUN 11  CREATININE 0.71  CALCIUM 8.8*   Liver Function Tests: Recent Labs  Lab 05/07/24 0407  AST 59*  ALT 26  ALKPHOS 90  BILITOT 0.9  PROT 8.5*  ALBUMIN 4.8   Recent Labs  Lab 05/07/24 0407  LIPASE 20   No results for input(s): "AMMONIA" in the last 168 hours. CBC: Recent Labs  Lab 05/07/24 0407  WBC 10.5  NEUTROABS 6.5  HGB 11.5*  HCT 36.5  MCV 98.1  PLT 267   Cardiac Enzymes: No results for input(s): "CKTOTAL", "CKMB", "CKMBINDEX", "TROPONINI" in the last 168 hours. BNP (last 3 results) No results for input(s): "BNP" in the last 8760 hours.  ProBNP (last 3 results) No results for input(s): "PROBNP" in the last 8760 hours.  CBG: No results for input(s): "GLUCAP" in the last 168 hours.  Radiological Exams on Admission: No results found.  Assessment/Plan Audrey Schultz is a 33 y.o. female with medical history significant for nicotine  dependence, daily alcohol  abuse being admitted to the hospital via jail with concern for alcohol  withdrawal delirium.  Currently her examination is normal, however she is at risk for worsening withdrawal.  Alcohol  Withdrawal-last drink 6/5, drinks 1/5 of liquor daily. -Inpatient admission to progressive -Thiamine , folate, multivitamin -P.o. Ativan  per CIWA protocol  Hypokalemia-repleted orally  Elevated AST-likely due to alcohol , will trend  DVT prophylaxis: Lovenox      Code Status: Full Code  Consults called: None  Admission status: The appropriate patient status for this patient is INPATIENT. Inpatient status  is judged to be reasonable and necessary in order to provide the required intensity of service to ensure the patient's safety. The patient's presenting symptoms, physical exam findings, and initial radiographic and laboratory data in the context of their chronic comorbidities is felt to place them at high risk for further clinical deterioration. Furthermore, it is not anticipated that the patient will be medically stable for discharge from the hospital within 2 midnights of admission.    I certify that at the point of admission it is my clinical judgment that the patient will require inpatient hospital care spanning beyond 2 midnights from the point of admission due to high intensity of service, high risk for further deterioration and high frequency of surveillance required  Time spent: 56 minutes  Netha Dafoe Rickey Charm MD [facility] Pager [phone]  If 7PM-7AM, please contact night-coverage [email or web address] Password [email or web address]  05/07/2024, 8:20 AM
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
## 2024-06-15 ENCOUNTER — Ambulatory Visit

## 2024-06-19 ENCOUNTER — Emergency Department (HOSPITAL_COMMUNITY)

## 2024-06-19 ENCOUNTER — Encounter (HOSPITAL_COMMUNITY): Payer: Self-pay | Admitting: Emergency Medicine

## 2024-06-19 ENCOUNTER — Other Ambulatory Visit: Payer: Self-pay

## 2024-06-19 ENCOUNTER — Observation Stay (HOSPITAL_COMMUNITY)
Admission: EM | Admit: 2024-06-19 | Discharge: 2024-06-21 | Disposition: A | Discharge status: Home / Self Care | Attending: Emergency Medicine | Admitting: Emergency Medicine

## 2024-06-19 DIAGNOSIS — M545 Low back pain, unspecified: Secondary | ICD-10-CM | POA: Diagnosis not present

## 2024-06-19 DIAGNOSIS — F17210 Nicotine dependence, cigarettes, uncomplicated: Secondary | ICD-10-CM | POA: Diagnosis not present

## 2024-06-19 DIAGNOSIS — R6510 Systemic inflammatory response syndrome (SIRS) of non-infectious origin without acute organ dysfunction: Secondary | ICD-10-CM | POA: Diagnosis not present

## 2024-06-19 DIAGNOSIS — M549 Dorsalgia, unspecified: Secondary | ICD-10-CM | POA: Diagnosis present

## 2024-06-19 DIAGNOSIS — Z720 Tobacco use: Secondary | ICD-10-CM | POA: Diagnosis present

## 2024-06-19 DIAGNOSIS — E876 Hypokalemia: Secondary | ICD-10-CM | POA: Diagnosis not present

## 2024-06-19 DIAGNOSIS — F10939 Alcohol use, unspecified with withdrawal, unspecified: Principal | ICD-10-CM | POA: Diagnosis present

## 2024-06-19 DIAGNOSIS — B9789 Other viral agents as the cause of diseases classified elsewhere: Secondary | ICD-10-CM | POA: Insufficient documentation

## 2024-06-19 DIAGNOSIS — Z79899 Other long term (current) drug therapy: Secondary | ICD-10-CM | POA: Diagnosis not present

## 2024-06-19 DIAGNOSIS — Z7901 Long term (current) use of anticoagulants: Secondary | ICD-10-CM | POA: Insufficient documentation

## 2024-06-19 DIAGNOSIS — R112 Nausea with vomiting, unspecified: Secondary | ICD-10-CM | POA: Diagnosis present

## 2024-06-19 DIAGNOSIS — R509 Fever, unspecified: Secondary | ICD-10-CM | POA: Diagnosis present

## 2024-06-19 DIAGNOSIS — F10239 Alcohol dependence with withdrawal, unspecified: Principal | ICD-10-CM | POA: Insufficient documentation

## 2024-06-19 DIAGNOSIS — D500 Iron deficiency anemia secondary to blood loss (chronic): Secondary | ICD-10-CM | POA: Diagnosis not present

## 2024-06-19 DIAGNOSIS — N946 Dysmenorrhea, unspecified: Secondary | ICD-10-CM

## 2024-06-19 DIAGNOSIS — F1010 Alcohol abuse, uncomplicated: Secondary | ICD-10-CM | POA: Diagnosis present

## 2024-06-19 DIAGNOSIS — D508 Other iron deficiency anemias: Secondary | ICD-10-CM | POA: Diagnosis present

## 2024-06-19 DIAGNOSIS — F1210 Cannabis abuse, uncomplicated: Secondary | ICD-10-CM | POA: Diagnosis present

## 2024-06-19 LAB — COMPREHENSIVE METABOLIC PANEL WITH GFR
ALT: 33 U/L (ref 0–44)
AST: 66 U/L — ABNORMAL HIGH (ref 15–41)
Albumin: 4 g/dL (ref 3.5–5.0)
Alkaline Phosphatase: 66 U/L (ref 38–126)
Anion gap: 15 (ref 5–15)
BUN: <5 mg/dL — ABNORMAL LOW (ref 6–20)
CO2: 22 mmol/L (ref 22–32)
Calcium: 8.9 mg/dL (ref 8.9–10.3)
Chloride: 102 mmol/L (ref 98–111)
Creatinine, Ser: 0.73 mg/dL (ref 0.44–1.00)
GFR, Estimated: >60 mL/min (ref >60)
Glucose, Bld: 93 mg/dL (ref 70–99)
Potassium: 3.5 mmol/L (ref 3.5–5.1)
Sodium: 139 mmol/L (ref 135–145)
Total Bilirubin: 0.4 mg/dL (ref 0.0–1.2)
Total Protein: 7.3 g/dL (ref 6.5–8.1)

## 2024-06-19 LAB — LIPASE, BLOOD: Lipase: 21 U/L (ref 11–51)

## 2024-06-19 LAB — CBC
HCT: 33.8 % — ABNORMAL LOW (ref 36.0–46.0)
Hemoglobin: 11.2 g/dL — ABNORMAL LOW (ref 12.0–15.0)
MCH: 31.4 pg (ref 26.0–34.0)
MCHC: 33.1 g/dL (ref 30.0–36.0)
MCV: 94.7 fL (ref 80.0–100.0)
Platelets: 275 K/uL (ref 150–400)
RBC: 3.57 MIL/uL — ABNORMAL LOW (ref 3.87–5.11)
RDW: 14.5 % (ref 11.5–15.5)
WBC: 5.2 K/uL (ref 4.0–10.5)
nRBC: 0 % (ref 0.0–0.2)

## 2024-06-19 LAB — TROPONIN I (HIGH SENSITIVITY)
Troponin I (High Sensitivity): 3 ng/L (ref <18)
Troponin I (High Sensitivity): 4 ng/L (ref <18)

## 2024-06-19 LAB — HCG, SERUM, QUALITATIVE: Preg, Serum: NEGATIVE

## 2024-06-19 LAB — I-STAT CG4 LACTIC ACID, ED: Lactic Acid, Venous: 2.8 mmol/L (ref 0.5–1.9)

## 2024-06-19 MED ORDER — METRONIDAZOLE 500 MG/100ML IV SOLN
500.0000 mg | Freq: Two times a day (BID) | INTRAVENOUS | Status: DC
  Administered 2024-06-20: 500 mg via INTRAVENOUS
  Filled 2024-06-19: qty 100

## 2024-06-19 MED ORDER — THIAMINE HCL 100 MG/ML IJ SOLN: 100.0000 mg | Freq: Every day | INTRAMUSCULAR | Status: DC

## 2024-06-19 MED ORDER — IOHEXOL 350 MG/ML SOLN
75.0000 mL | Freq: Once | INTRAVENOUS | Status: AC | PRN
  Administered 2024-06-19: 75 mL via INTRAVENOUS

## 2024-06-19 MED ORDER — LORAZEPAM 1 MG PO TABS: 1.0000 mg | ORAL_TABLET | ORAL | Status: DC | PRN

## 2024-06-19 MED ORDER — LORAZEPAM 2 MG/ML IJ SOLN
1.0000 mg | INTRAMUSCULAR | Status: DC | PRN
  Administered 2024-06-20: 1 mg via INTRAVENOUS
  Filled 2024-06-19: qty 1

## 2024-06-19 MED ORDER — THIAMINE HCL 100 MG/ML IJ SOLN
100.0000 mg | Freq: Every day | INTRAMUSCULAR | Status: DC
  Administered 2024-06-20: 100 mg via INTRAVENOUS
  Filled 2024-06-19: qty 2

## 2024-06-19 MED ORDER — FOLIC ACID 1 MG PO TABS
1.0000 mg | ORAL_TABLET | Freq: Every day | ORAL | Status: DC
  Administered 2024-06-20 – 2024-06-21 (×2): 1 mg via ORAL
  Filled 2024-06-19 (×2): qty 1

## 2024-06-19 MED ORDER — SODIUM CHLORIDE 0.9 % IV SOLN: 150.0000 mL/h | INTRAVENOUS | Status: DC

## 2024-06-19 MED ORDER — SODIUM CHLORIDE 0.9 % IV SOLN
2.0000 g | INTRAVENOUS | Status: DC
  Administered 2024-06-20: 2 g via INTRAVENOUS
  Filled 2024-06-19: qty 20

## 2024-06-19 MED ORDER — ADULT MULTIVITAMIN W/MINERALS CH
1.0000 | ORAL_TABLET | Freq: Every day | ORAL | Status: DC
  Administered 2024-06-20 – 2024-06-21 (×2): 1 via ORAL
  Filled 2024-06-19 (×2): qty 1

## 2024-06-19 MED ORDER — LACTATED RINGERS IV BOLUS: 1000.0000 mL | Freq: Once | INTRAVENOUS | Status: DC

## 2024-06-19 MED ORDER — LACTATED RINGERS IV BOLUS
1000.0000 mL | Freq: Once | INTRAVENOUS | Status: AC
  Administered 2024-06-19: 1000 mL via INTRAVENOUS

## 2024-06-19 MED ORDER — DIAZEPAM 5 MG/ML IJ SOLN
5.0000 mg | Freq: Once | INTRAMUSCULAR | Status: AC
  Administered 2024-06-20: 5 mg via INTRAVENOUS
  Filled 2024-06-19: qty 2

## 2024-06-19 MED ORDER — DIAZEPAM 5 MG/ML IJ SOLN
10.0000 mg | Freq: Once | INTRAMUSCULAR | Status: AC
  Administered 2024-06-19: 10 mg via INTRAVENOUS
  Filled 2024-06-19: qty 2

## 2024-06-19 MED ORDER — MORPHINE SULFATE (PF) 4 MG/ML IV SOLN
4.0000 mg | Freq: Once | INTRAVENOUS | Status: DC
  Administered 2024-06-20: 4 mg via INTRAVENOUS

## 2024-06-19 MED ORDER — THIAMINE MONONITRATE 100 MG PO TABS
100.0000 mg | ORAL_TABLET | Freq: Every day | ORAL | Status: DC
  Administered 2024-06-20 – 2024-06-21 (×2): 100 mg via ORAL
  Filled 2024-06-19 (×2): qty 1

## 2024-06-19 MED ORDER — KETOROLAC TROMETHAMINE 15 MG/ML IJ SOLN
15.0000 mg | Freq: Once | INTRAMUSCULAR | Status: AC
  Administered 2024-06-20: 15 mg via INTRAVENOUS
  Filled 2024-06-19: qty 1

## 2024-06-19 NOTE — ED Triage Notes (Addendum)
 Patient BIB [facility] c/o low back pain, abdominal pain, chest pain, generalized body aches, chills, fever of 99.  Pain is worse with movement.  Patient also states she thinks she's in etoh withdrawal.  Normally drinks a 1/5 of alcohol  a day, hasn't drank today.   138/90 100 99% RA

## 2024-06-19 NOTE — ED Notes (Signed)
 Patient transported to CT

## 2024-06-19 NOTE — Assessment & Plan Note (Signed)
-  SIRS criteria met with     tachycardia   ,   fever   Today's Vitals   06/19/24 1922 06/19/24 1927 06/19/24 1949 06/19/24 2240  BP:  (!) 138/105 (!) 138/105 (!) 155/104  Pulse:  (!) 110 (!) 110 87  Resp:  18  19  Temp:  100.2 F (37.9 C)  100 F (37.8 C)  TempSrc:  Oral  Oral  SpO2:  100%  100%  Weight: 61.2 kg     PainSc: 10-Worst pain ever        The recent clinical data is shown below. Vitals:   06/19/24 1922 06/19/24 1927 06/19/24 1949 06/19/24 2240  BP:  (!) 138/105 (!) 138/105 (!) 155/104  Pulse:  (!) 110 (!) 110 87  Resp:  18  19  Temp:  100.2 F (37.9 C)  100 F (37.8 C)  TempSrc:  Oral  Oral  SpO2:  100%  100%  Weight: 61.2 kg        -Most likely source being: Source of sepsis is unknown but given clinical picture will continue to treat   Patient meeting criteria for Severe sepsis with    evidence of end organ damage/organ dysfunction such as  elevated lactic acid >2     Component Value Date/Time   LATICACIDVEN 2.8 (HH) 06/19/2024 2216       - Obtain serial lactic acid and procalcitonin level.  - Initiated IV antibiotics     Rocephin  flagyl      - await results of blood and urine culture  - Rehydrate aggressively  Intravenous fluids were administered,      11:49 PM

## 2024-06-19 NOTE — Assessment & Plan Note (Signed)
 Evaluate for sources of infection Blood cultures pending CT abdomen nonacute chest x-ray showing no evidence of pneumonia Patient is endorsing back pain will obtain CT lumbar spine read Obtain respiratory panel UA pending Alcohol  withdrawal could be also source of fever as well given the so far has been low-grade For right now covered with Rocephin  Flagyl  given abdominal complaints

## 2024-06-19 NOTE — Subjective & Objective (Signed)
 Patient with daily alcohol  abuse history Presents with fatigue myalgias back pain diarrhea Chills and bodyaches Subjective fevers She drinks about 1/5 a day last drink was states yesterday States she has not experienced withdrawals because she never stops drinking but per records she has been admitted back in June for alcohol  withdrawals requiring CIWA protocol Denies history of seizures secondary to alcohol  withdrawal but states she occasionally has jerking movements of her body while remaining conscious Denies sick contacts Denies dysuria Patient is interested in quitting drinking. Feels like she is going through alcohol  withdrawal she is anxious and tremulous

## 2024-06-19 NOTE — ED Provider Notes (Signed)
 [facility] Provider Note   CSN: [id number] Arrival date & time: 06/19/24  1918     Patient presents with: No chief complaint on file.   Audrey Schultz is a 33 y.o. female.   HPI   Patient has a history of anemia alcohol  use disorder.  Patient presents ED with complaints of diffuse aches and pains as well as nausea vomiting.  Patient states she drinks alcohol  daily.  She decided she was going to try and quit drinking.  Patient stopped drinking yesterday.  Following that she began experiencing pain in her lower back as well as her lower abdomen and her chest.  She is aching all over.  She is feeling chilled and feverish.  She has been coughing.  No dysuria.  Patient denies any vaginal bleeding or discharge  Prior to Admission medications   Medication Sig Start Date End Date Taking? Authorizing Provider  acetaminophen  (TYLENOL ) 325 MG tablet Take 650 mg by mouth every 6 (six) hours as needed for moderate pain (pain score 4-6).    [provider]  cyclobenzaprine  (FLEXERIL ) 10 MG tablet Take 0.5-1 tablets (5-10 mg total) by mouth at bedtime. 02/29/24   Veta Palma, PA-C  docusate sodium  (COLACE) 100 MG capsule Take 1 capsule (100 mg total) by mouth 2 (two) times daily. 03/10/24   Billy Knee, FNP  ibuprofen  (ADVIL ) 800 MG tablet Take 1 tablet (800 mg total) by mouth 3 (three) times daily with meals as needed for headache, moderate pain (pain score 4-6) or cramping. 01/06/24   Zina Jerilynn LABOR, MD  Iron , Ferrous Sulfate , 325 (65 Fe) MG TABS Take 325 mg by mouth daily. Patient not taking: Reported on 03/30/2024 12/09/23   Billy Knee, FNP  medroxyPROGESTERone  (DEPO-PROVERA ) 150 MG/ML injection Inject 1 mL (150 mg total) into the muscle every 3 (three) months. 06/13/24   Arnold, James G, MD  Multiple Vitamin (MULTIVITAMIN WITH MINERALS) TABS tablet Take 1 tablet by mouth daily. 03/19/22   Ghimire, Donalda HERO, MD  ondansetron  (ZOFRAN -ODT) 4  MG disintegrating tablet Take 1 tablet (4 mg total) by mouth every 8 (eight) hours as needed for nausea or vomiting. 02/29/24   Veta Palma, PA-C  polyethylene glycol powder (GLYCOLAX /MIRALAX ) 17 GM/SCOOP powder Take 17 g by mouth daily as needed for moderate constipation. 03/10/24   Billy Knee, FNP    Allergies: Shellfish allergy    Review of Systems  Updated Vital Signs BP (!) 155/104 (BP Location: Left Arm)   Pulse 87   Temp 100 F (37.8 C) (Oral)   Resp 19   Wt 61.2 kg   SpO2 100%   BMI 23.91 kg/m   Physical Exam Vitals and nursing note reviewed.  Constitutional:      Appearance: She is well-developed. She is ill-appearing.  HENT:     Head: Normocephalic and atraumatic.     Right Ear: External ear normal.     Left Ear: External ear normal.  Eyes:     General: No scleral icterus.       Right eye: No discharge.        Left eye: No discharge.     Conjunctiva/sclera: Conjunctivae normal.  Neck:     Trachea: No tracheal deviation.  Cardiovascular:     Rate and Rhythm: Normal rate and regular rhythm.  Pulmonary:     Effort: Pulmonary effort is normal. No respiratory distress.     Breath sounds: Normal breath sounds. No stridor. No wheezing or  rales.  Abdominal:     General: Bowel sounds are normal. There is no distension.     Palpations: Abdomen is soft.     Tenderness: There is abdominal tenderness. There is no guarding or rebound.     Comments: Mild tenderness palpation lower abdomen  Musculoskeletal:        General: No tenderness or deformity.     Cervical back: Neck supple.  Skin:    General: Skin is warm and dry.     Findings: No rash.  Neurological:     General: No focal deficit present.     Mental Status: She is alert.     Cranial Nerves: No cranial nerve deficit, dysarthria or facial asymmetry.     Sensory: No sensory deficit.     Motor: No abnormal muscle tone or seizure activity.     Coordination: Coordination normal.  Psychiatric:        Mood  and Affect: Mood normal.     (all labs ordered are listed, but only abnormal results are displayed) Labs Reviewed  CBC - Abnormal; Notable for the following components:      Result Value   RBC 3.57 (*)    Hemoglobin 11.2 (*)    HCT 33.8 (*)    All other components within normal limits  COMPREHENSIVE METABOLIC PANEL WITH GFR - Abnormal; Notable for the following components:   BUN <5 (*)    AST 66 (*)    All other components within normal limits  I-STAT CG4 LACTIC ACID, ED - Abnormal; Notable for the following components:   Lactic Acid, Venous 2.8 (*)    All other components within normal limits  CULTURE, BLOOD (ROUTINE X 2)  CULTURE, BLOOD (ROUTINE X 2)  RESP PANEL BY RT-PCR (RSV, FLU A&B, COVID)  RVPGX2  LIPASE, BLOOD  HCG, SERUM, QUALITATIVE  URINALYSIS, ROUTINE W REFLEX MICROSCOPIC  RAPID URINE DRUG SCREEN, HOSP PERFORMED  I-STAT CG4 LACTIC ACID, ED  TROPONIN I (HIGH SENSITIVITY)  TROPONIN I (HIGH SENSITIVITY)    EKG: None  Radiology: CT ABDOMEN PELVIS W CONTRAST Result Date: 06/19/2024 CLINICAL DATA:  Abdominal pain, acute, nonlocalized EXAM: CT ABDOMEN AND PELVIS WITH CONTRAST TECHNIQUE: Multidetector CT imaging of the abdomen and pelvis was performed using the standard protocol following bolus administration of intravenous contrast. RADIATION DOSE REDUCTION: This exam was performed according to the departmental dose-optimization program which includes automated exposure control, adjustment of the mA and/or kV according to patient size and/or use of iterative reconstruction technique. CONTRAST:  75mL OMNIPAQUE  IOHEXOL  350 MG/ML SOLN COMPARISON:  CT abdomen pelvis 03/01/2024 FINDINGS: Lower chest: No acute abnormality. Hepatobiliary: Focal hypodensity along falciform ligament likely focal fatty infiltration. No gallstones, gallbladder wall thickening, or pericholecystic fluid. No biliary dilatation. Pancreas: No focal lesion. Normal pancreatic contour. No surrounding  inflammatory changes. No main pancreatic ductal dilatation. Spleen: Normal in size without focal abnormality. Adrenals/Urinary Tract: No adrenal nodule bilaterally. Bilateral kidneys enhance symmetrically. No hydronephrosis. No hydroureter. The urinary bladder is unremarkable. Stomach/Bowel: Stomach is within normal limits. No evidence of bowel wall thickening or dilatation. The appendix is not definitely identified with no inflammatory changes in the right lower quadrant to suggest acute appendicitis. Vascular/Lymphatic: No abdominal aorta or iliac aneurysm. No abdominal, pelvic, or inguinal lymphadenopathy. Reproductive: Uterus and bilateral adnexa are unremarkable. Other: Nonspecific trace volume intraperitoneal free fluid. No intraperitoneal free gas. No organized fluid collection. Musculoskeletal: No abdominal wall hernia or abnormality. No suspicious lytic or blastic osseous lesions. No acute displaced fracture. Intramedullary  nail fixation of the partially visualized proximal right femur. IMPRESSION: No acute intra-abdominal or intrapelvic abnormality. Electronically Signed   By: Morgane  Naveau M.D.   On: 06/19/2024 23:05   DG Chest 2 View Result Date: 06/19/2024 CLINICAL DATA:  Chest pain. EXAM: CHEST - 2 VIEW COMPARISON:  01/25/2023. FINDINGS: Normal heart, mediastinum and hila. Clear lungs.  No pleural effusion or pneumothorax. Skeletal structures are within normal limits. IMPRESSION: Normal chest radiographs. Electronically Signed   By: Alm Parkins M.D.   On: 06/19/2024 20:09     .Critical Care  Performed by: Randol Simmonds, MD Authorized by: Randol Simmonds, MD   Critical care provider statement:    Critical care time (minutes):  30   Critical care was time spent personally by me on the following activities:  Development of treatment plan with patient or surrogate, discussions with consultants, evaluation of patient's response to treatment, examination of patient, ordering and review of laboratory  studies, ordering and review of radiographic studies, ordering and performing treatments and interventions, pulse oximetry, re-evaluation of patient's condition and review of old charts    Medications Ordered in the ED  diazepam  (VALIUM ) injection 5 mg (has no administration in time range)  ketorolac  (TORADOL ) 15 MG/ML injection 15 mg (has no administration in time range)  morphine  (PF) 4 MG/ML injection 4 mg (has no administration in time range)  lactated ringers  bolus 1,000 mL (has no administration in time range)  lactated ringers  bolus 1,000 mL (1,000 mLs Intravenous New Bag/Given 06/19/24 2211)  diazepam  (VALIUM ) injection 10 mg (10 mg Intravenous Given 06/19/24 2217)  iohexol  (OMNIPAQUE ) 350 MG/ML injection 75 mL (75 mLs Intravenous Contrast Given 06/19/24 2253)    Clinical Course as of 06/19/24 2338  Sat Jun 19, 2024  2204 CBC(!) No leukocytosis.  Metabolic panel showed a slight increase in LFTs. [JK]  2204 Chest x-ray without acute findings [JK]  2221 I-Stat CG4 Lactic Acid(!!) elevated [JK]  2221 Chest x-ray without acute findings [JK]  2328 I-Stat CG4 Lactic Acid(!!) Lactic acid level remains elevated but improving. [JK]  2328 CT scan does not show any acute abnormality. [JK]  2337 Case discussed with Dr Silvester regarindg admisison [JK]    Clinical Course User Index [JK] Randol Simmonds, MD                                 Medical Decision Making Amount and/or Complexity of Data Reviewed Labs: ordered. Decision-making details documented in ED Course. Radiology: ordered.  Risk Prescription drug management. Decision regarding hospitalization.   Patient presented to ED with complaints of diffuse bodyaches back pain abdominal pain chest pain.  Patient has also had some low-grade temperature.  Patient also reports recently quitting alcohol  and feeling like she might be withdrawing.  Consider the possibility of alcohol  withdrawal delirium tremens.  Patient is not altered.  She  does have low-grade temperature however.  I do suspect she has of a component of acute alcohol  withdrawal.  Patient is complaining of diffuse body aches chest pain abdominal pain.  CT scan does not show pancreatitis or other acute abnormality.  Chest x-ray does not show pneumonia.  No signs of cardiac injury on her EKG and troponin.  She does have persistent lactic acidosis.  I have sent off blood cultures.  We do have a urinalysis pending.  Will also add on COVID flu RSV.  Case discussed with Dr. Silvester.  Will plan on admission  for further evaluation.     Final diagnoses:  Alcohol  withdrawal syndrome with complication [facility])    ED Discharge Orders     None          Randol Simmonds, MD 06/19/24 2338

## 2024-06-20 ENCOUNTER — Observation Stay (HOSPITAL_COMMUNITY)

## 2024-06-20 DIAGNOSIS — M549 Dorsalgia, unspecified: Secondary | ICD-10-CM | POA: Diagnosis present

## 2024-06-20 DIAGNOSIS — F10939 Alcohol use, unspecified with withdrawal, unspecified: Secondary | ICD-10-CM

## 2024-06-20 DIAGNOSIS — R6510 Systemic inflammatory response syndrome (SIRS) of non-infectious origin without acute organ dysfunction: Secondary | ICD-10-CM

## 2024-06-20 DIAGNOSIS — M545 Low back pain, unspecified: Secondary | ICD-10-CM | POA: Diagnosis not present

## 2024-06-20 DIAGNOSIS — D508 Other iron deficiency anemias: Secondary | ICD-10-CM

## 2024-06-20 DIAGNOSIS — R509 Fever, unspecified: Secondary | ICD-10-CM | POA: Diagnosis not present

## 2024-06-20 DIAGNOSIS — Z720 Tobacco use: Secondary | ICD-10-CM

## 2024-06-20 DIAGNOSIS — B348 Other viral infections of unspecified site: Secondary | ICD-10-CM

## 2024-06-20 DIAGNOSIS — F1010 Alcohol abuse, uncomplicated: Secondary | ICD-10-CM

## 2024-06-20 DIAGNOSIS — F1210 Cannabis abuse, uncomplicated: Secondary | ICD-10-CM

## 2024-06-20 LAB — APTT: aPTT: 31 s (ref 24–36)

## 2024-06-20 LAB — RESPIRATORY PANEL BY PCR

## 2024-06-20 LAB — RETICULOCYTES
Immature Retic Fract: 15.5 % (ref 2.3–15.9)
RBC.: 3.53 MIL/uL — ABNORMAL LOW (ref 3.87–5.11)
Retic Count, Absolute: 34.6 K/uL (ref 19.0–186.0)
Retic Ct Pct: 1 % (ref 0.4–3.1)

## 2024-06-20 LAB — COMPREHENSIVE METABOLIC PANEL WITH GFR
ALT: 21 U/L (ref 0–44)
AST: 34 U/L (ref 15–41)
Albumin: 2.9 g/dL — ABNORMAL LOW (ref 3.5–5.0)
Alkaline Phosphatase: 51 U/L (ref 38–126)
Anion gap: 10 (ref 5–15)
BUN: <5 mg/dL — ABNORMAL LOW (ref 6–20)
CO2: 22 mmol/L (ref 22–32)
Calcium: 7.5 mg/dL — ABNORMAL LOW (ref 8.9–10.3)
Chloride: 108 mmol/L (ref 98–111)
Creatinine, Ser: 0.63 mg/dL (ref 0.44–1.00)
GFR, Estimated: >60 mL/min (ref >60)
Glucose, Bld: 92 mg/dL (ref 70–99)
Potassium: 3 mmol/L — ABNORMAL LOW (ref 3.5–5.1)
Sodium: 140 mmol/L (ref 135–145)
Total Bilirubin: 0.3 mg/dL (ref 0.0–1.2)
Total Protein: 5.4 g/dL — ABNORMAL LOW (ref 6.5–8.1)

## 2024-06-20 LAB — DIFFERENTIAL
Abs Immature Granulocytes: 0.01 K/uL (ref 0.00–0.07)
Basophils Absolute: 0 K/uL (ref 0.0–0.1)
Basophils Relative: 0 %
Eosinophils Absolute: 0 K/uL (ref 0.0–0.5)
Eosinophils Relative: 0 %
Immature Granulocytes: 0 %
Lymphocytes Relative: 47 %
Lymphs Abs: 1.8 K/uL (ref 0.7–4.0)
Monocytes Absolute: 0.3 K/uL (ref 0.1–1.0)
Monocytes Relative: 7 %
Neutro Abs: 1.8 K/uL (ref 1.7–7.7)
Neutrophils Relative %: 46 %

## 2024-06-20 LAB — C-REACTIVE PROTEIN: CRP: <0.5 mg/dL (ref <1.0)

## 2024-06-20 LAB — HEPATITIS PANEL, ACUTE
HCV Ab: NONREACTIVE
Hep A IgM: NONREACTIVE
Hep B C IgM: NONREACTIVE
Hepatitis B Surface Ag: NONREACTIVE

## 2024-06-20 LAB — VITAMIN B12: Vitamin B-12: 79 pg/mL — ABNORMAL LOW (ref 180–914)

## 2024-06-20 LAB — URINALYSIS, ROUTINE W REFLEX MICROSCOPIC
Bilirubin Urine: NEGATIVE
Glucose, UA: NEGATIVE mg/dL
Hgb urine dipstick: NEGATIVE
Ketones, ur: NEGATIVE mg/dL
Leukocytes,Ua: NEGATIVE
Nitrite: NEGATIVE
Protein, ur: NEGATIVE mg/dL
Specific Gravity, Urine: >1.046 — ABNORMAL HIGH (ref 1.005–1.030)
pH: 7 (ref 5.0–8.0)

## 2024-06-20 LAB — PHOSPHORUS
Phosphorus: 2.8 mg/dL (ref 2.5–4.6)
Phosphorus: 2.9 mg/dL (ref 2.5–4.6)

## 2024-06-20 LAB — RESP PANEL BY RT-PCR (RSV, FLU A&B, COVID)  RVPGX2
Influenza A by PCR: NEGATIVE
Influenza B by PCR: NEGATIVE
Resp Syncytial Virus by PCR: NEGATIVE
SARS Coronavirus 2 by RT PCR: NEGATIVE

## 2024-06-20 LAB — CBC
HCT: 26.8 % — ABNORMAL LOW (ref 36.0–46.0)
Hemoglobin: 8.9 g/dL — ABNORMAL LOW (ref 12.0–15.0)
MCH: 31.7 pg (ref 26.0–34.0)
MCHC: 33.2 g/dL (ref 30.0–36.0)
MCV: 95.4 fL (ref 80.0–100.0)
Platelets: 220 K/uL (ref 150–400)
RBC: 2.81 MIL/uL — ABNORMAL LOW (ref 3.87–5.11)
RDW: 14.5 % (ref 11.5–15.5)
WBC: 3.8 K/uL — ABNORMAL LOW (ref 4.0–10.5)
nRBC: 0 % (ref 0.0–0.2)

## 2024-06-20 LAB — CK: Total CK: 85 U/L (ref 38–234)

## 2024-06-20 LAB — MRSA NEXT GEN BY PCR, NASAL: MRSA by PCR Next Gen: NOT DETECTED

## 2024-06-20 LAB — LACTIC ACID, PLASMA
Lactic Acid, Venous: 1.3 mmol/L (ref 0.5–1.9)
Lactic Acid, Venous: 1.3 mmol/L (ref 0.5–1.9)

## 2024-06-20 LAB — SEDIMENTATION RATE: Sed Rate: 5 mm/h (ref 0–22)

## 2024-06-20 LAB — FOLATE: Folate: 7.3 ng/mL (ref >5.9)

## 2024-06-20 LAB — IRON AND TIBC
Iron: 50 ug/dL (ref 28–170)
Saturation Ratios: 24 % (ref 10.4–31.8)
TIBC: 207 ug/dL — ABNORMAL LOW (ref 250–450)
UIBC: 157 ug/dL

## 2024-06-20 LAB — FERRITIN: Ferritin: 137 ng/mL (ref 11–307)

## 2024-06-20 LAB — PROTIME-INR
INR: 1.2 (ref 0.8–1.2)
Prothrombin Time: 15.7 s — ABNORMAL HIGH (ref 11.4–15.2)

## 2024-06-20 LAB — MAGNESIUM: Magnesium: 1.2 mg/dL — ABNORMAL LOW (ref 1.7–2.4)

## 2024-06-20 LAB — PREALBUMIN: Prealbumin: 31 mg/dL (ref 18–38)

## 2024-06-20 LAB — AMMONIA: Ammonia: 31 umol/L (ref 9–35)

## 2024-06-20 LAB — PROCALCITONIN: Procalcitonin: <0.1 ng/mL

## 2024-06-20 LAB — ETHANOL: Alcohol, Ethyl (B): <15 mg/dL (ref <15)

## 2024-06-20 MED ORDER — NICOTINE 21 MG/24HR TD PT24
21.0000 mg | MEDICATED_PATCH | Freq: Every day | TRANSDERMAL | Status: DC
  Administered 2024-06-20 – 2024-06-21 (×2): 21 mg via TRANSDERMAL
  Filled 2024-06-20 (×2): qty 1

## 2024-06-20 MED ORDER — SODIUM CHLORIDE 0.9 % IV BOLUS
1000.0000 mL | Freq: Once | INTRAVENOUS | Status: AC
  Administered 2024-06-20: 1000 mL via INTRAVENOUS

## 2024-06-20 MED ORDER — ONDANSETRON HCL 4 MG/2ML IJ SOLN: 4.0000 mg | Freq: Four times a day (QID) | INTRAMUSCULAR | Status: DC | PRN

## 2024-06-20 MED ORDER — POTASSIUM CHLORIDE CRYS ER 20 MEQ PO TBCR
20.0000 meq | EXTENDED_RELEASE_TABLET | Freq: Once | ORAL | Status: AC
  Administered 2024-06-20: 20 meq via ORAL
  Filled 2024-06-20: qty 1

## 2024-06-20 MED ORDER — ACETAMINOPHEN 650 MG RE SUPP: 650.0000 mg | Freq: Four times a day (QID) | RECTAL | Status: DC | PRN

## 2024-06-20 MED ORDER — KETOROLAC TROMETHAMINE 15 MG/ML IJ SOLN
15.0000 mg | Freq: Four times a day (QID) | INTRAMUSCULAR | Status: DC | PRN
  Administered 2024-06-20 – 2024-06-21 (×3): 15 mg via INTRAVENOUS
  Filled 2024-06-20 (×3): qty 1

## 2024-06-20 MED ORDER — HYDROCODONE-ACETAMINOPHEN 5-325 MG PO TABS
1.0000 | ORAL_TABLET | ORAL | Status: DC | PRN
  Administered 2024-06-20: 2 via ORAL
  Filled 2024-06-20: qty 1
  Filled 2024-06-20: qty 2

## 2024-06-20 MED ORDER — MORPHINE SULFATE (PF) 2 MG/ML IV SOLN
4.0000 mg | Freq: Once | INTRAVENOUS | Status: AC
  Administered 2024-06-20: 4 mg via INTRAVENOUS
  Filled 2024-06-20: qty 2

## 2024-06-20 MED ORDER — SODIUM CHLORIDE 0.9 % IV BOLUS: 1000.0000 mL | INTRAVENOUS | Status: DC

## 2024-06-20 MED ORDER — SODIUM CHLORIDE 0.9 % IV BOLUS: 500.0000 mL | INTRAVENOUS | Status: DC

## 2024-06-20 MED ORDER — SODIUM CHLORIDE 0.9 % IV SOLN
INTRAVENOUS | Status: AC
  Administered 2024-06-20 (×2): 100 mL via INTRAVENOUS

## 2024-06-20 MED ORDER — ONDANSETRON HCL 4 MG PO TABS: 4.0000 mg | ORAL_TABLET | Freq: Four times a day (QID) | ORAL | Status: DC | PRN

## 2024-06-20 MED ORDER — POTASSIUM CHLORIDE CRYS ER 20 MEQ PO TBCR
40.0000 meq | EXTENDED_RELEASE_TABLET | Freq: Every day | ORAL | Status: DC
  Administered 2024-06-20: 40 meq via ORAL
  Filled 2024-06-20: qty 2

## 2024-06-20 MED ORDER — FENTANYL CITRATE PF 50 MCG/ML IJ SOSY: 25.0000 ug | PREFILLED_SYRINGE | INTRAMUSCULAR | Status: DC | PRN

## 2024-06-20 MED ORDER — ACETAMINOPHEN 325 MG PO TABS
650.0000 mg | ORAL_TABLET | Freq: Four times a day (QID) | ORAL | Status: DC | PRN
Start: 2024-06-20 — End: 2024-06-21

## 2024-06-20 NOTE — Assessment & Plan Note (Signed)
Spoke about importance of quitting

## 2024-06-20 NOTE — Assessment & Plan Note (Signed)
 Obtain anemia panel restart iron  supplements when able to tolerate

## 2024-06-20 NOTE — ED Notes (Signed)
#  e called to let know pt is on the way up

## 2024-06-20 NOTE — Plan of Care (Signed)
 Patient trying to feel better and make it through withdrawal.

## 2024-06-20 NOTE — Plan of Care (Signed)
  Problem: Respiratory: Goal: Ability to maintain adequate ventilation will improve Outcome: Progressing   Problem: Clinical Measurements: Goal: Respiratory complications will improve Outcome: Progressing Goal: Cardiovascular complication will be avoided Outcome: Progressing   Problem: Coping: Goal: Level of anxiety will decrease Outcome: Progressing   Problem: Safety: Goal: Ability to remain free from injury will improve Outcome: Progressing

## 2024-06-20 NOTE — Assessment & Plan Note (Signed)
 Started tonight In the setting of alcohol  abuse generalized malaise diarrhea and other complaints but there is also low-grade fever For completion will obtain CT lumbar spine read CT abdomen already done Check sed rate and CRP Reassess if needs further workup

## 2024-06-20 NOTE — Assessment & Plan Note (Signed)
Order CIWA protocol °

## 2024-06-20 NOTE — Progress Notes (Signed)
 Progress Note   Patient: Audrey Schultz FMW:[id number] DOB: [birth year] DOA: 06/19/2024     0 DOS: the patient was seen and examined on 06/20/2024   Brief hospital course: Audrey Schultz is a 33 y.o. female with medical history significant of alcohol  abuse, tobacco abuse, marijuana abuse presented to the emergency department for generalized weakness, diarrhea, back pain.  Patient also complained of subjective fever, chills, body aches.  Patient is admitted to TR[facility] service for further management evaluation of alcohol  withdrawal, SIRS, polysubstance abuse.  Assessment and Plan: Febrile illness Rhinovirus positive Stop IV antibiotics Rocephin  and azithromycin. UA unremarkable. Continue supportive care. Advised out of bed to chair, physical therapy evaluation.  Polysubstance abuse: Advised to stop drinking alcohol . Discussed about smoking cessation. Ill effects of marijuana explained. Continue CIWA protocol. Thiamine , folate and multivitamin ordered.  Iron  deficiency anemia: Patient's iron , ferritin, folate and B12 levels normal. Continue multivitamins, thiamine  and folate.  Back pain: CT lumbar spine done negative for any acute etiology Continue pain control. Out of bed to chair advised.      Nursing supportive care. Fall, aspiration precautions. Diet:  Diet Orders (From admission, onward)     Start     Ordered   06/20/24 0118  Diet regular Room service appropriate? Yes; Fluid consistency: Thin  Diet effective now       Question Answer Comment  Room service appropriate? Yes   Fluid consistency: Thin      06/20/24 0117           DVT prophylaxis: SCDs Start: 06/20/24 0118  Level of care: Progressive   Code Status: Full Code  Subjective: Patient is seen and examined today morning.  She is sleeping comfortably.  Has back pain, cough, generalized weakness.  Eating poor. Encouraged out of bed, physical therapy.  Physical Exam: Vitals:   06/20/24 0241  06/20/24 0300 06/20/24 0407 06/20/24 0800  BP:  103/66    Pulse: 90 80    Resp: 18 17 19    Temp:  98.7 F (37.1 C) 98.1 F (36.7 C)   TempSrc:  Oral Oral Oral  SpO2: 100% 100%    Weight:      Height:        General -Young American female, no apparent distress HEENT - PERRLA, EOMI, atraumatic head, non tender sinuses. Lung - Clear, no rales, rhonchi, wheezes. Heart - S1, S2 heard, no murmurs, rubs, trace pedal edema. Abdomen - Soft, non tender, bowel sounds good Neuro - Alert, awake and oriented, non focal exam. Skin - Warm and dry.  Data Reviewed:      Latest Ref Rng & Units 06/20/2024    3:44 AM 06/19/2024    7:36 PM 05/07/2024    4:07 AM  CBC  WBC 4.0 - 10.5 K/uL 3.8  5.2  10.5   Hemoglobin 12.0 - 15.0 g/dL 8.9  88.7  88.4   Hematocrit 36.0 - 46.0 % 26.8  33.8  36.5   Platelets 150 - 400 K/uL 220  275  267       Latest Ref Rng & Units 06/20/2024    3:44 AM 06/19/2024    7:36 PM 05/07/2024    4:07 AM  BMP  Glucose 70 - 99 mg/dL 92  93  67   BUN 6 - 20 mg/dL <5  <5  11   Creatinine 0.44 - 1.00 mg/dL 9.36  9.26  9.28   Sodium 135 - 145 mmol/L 140  139  140   Potassium 3.5 -  5.1 mmol/L 3.0  3.5  3.1   Chloride 98 - 111 mmol/L 108  102  105   CO2 22 - 32 mmol/L 22  22  16    Calcium 8.9 - 10.3 mg/dL 7.5  8.9  8.8    CT L-SPINE NO CHARGE Result Date: 06/20/2024 EXAM: CT OF THE LUMBAR SPINE WITHOUT CONTRAST 06/20/2024 12:08:41 AM TECHNIQUE: CT of the lumbar spine was performed without the administration of intravenous contrast. Multiplanar reformatted images are provided for review. Automated exposure control, iterative reconstruction, and/or weight based adjustment of the mA/kV was utilized to reduce the radiation dose to as low as reasonably achievable. COMPARISON: None available. CLINICAL HISTORY: FINDINGS: BONES AND ALIGNMENT: Normal vertebral body heights. No acute fracture or suspicious bone lesion. Normal alignment. DEGENERATIVE CHANGES: No significant degenerative  changes. SOFT TISSUES: Evaluated on dedicated CT abdomen/pelvis. IMPRESSION: 1. Normal CT lumbar spine. Electronically signed by: Pinkie Pebbles MD 06/20/2024 12:17 AM EDT RP Workstation: [id number]   CT ABDOMEN PELVIS W CONTRAST Result Date: 06/19/2024 CLINICAL DATA:  Abdominal pain, acute, nonlocalized EXAM: CT ABDOMEN AND PELVIS WITH CONTRAST TECHNIQUE: Multidetector CT imaging of the abdomen and pelvis was performed using the standard protocol following bolus administration of intravenous contrast. RADIATION DOSE REDUCTION: This exam was performed according to the departmental dose-optimization program which includes automated exposure control, adjustment of the mA and/or kV according to patient size and/or use of iterative reconstruction technique. CONTRAST:  75mL OMNIPAQUE  IOHEXOL  350 MG/ML SOLN COMPARISON:  CT abdomen pelvis 03/01/2024 FINDINGS: Lower chest: No acute abnormality. Hepatobiliary: Focal hypodensity along falciform ligament likely focal fatty infiltration. No gallstones, gallbladder wall thickening, or pericholecystic fluid. No biliary dilatation. Pancreas: No focal lesion. Normal pancreatic contour. No surrounding inflammatory changes. No main pancreatic ductal dilatation. Spleen: Normal in size without focal abnormality. Adrenals/Urinary Tract: No adrenal nodule bilaterally. Bilateral kidneys enhance symmetrically. No hydronephrosis. No hydroureter. The urinary bladder is unremarkable. Stomach/Bowel: Stomach is within normal limits. No evidence of bowel wall thickening or dilatation. The appendix is not definitely identified with no inflammatory changes in the right lower quadrant to suggest acute appendicitis. Vascular/Lymphatic: No abdominal aorta or iliac aneurysm. No abdominal, pelvic, or inguinal lymphadenopathy. Reproductive: Uterus and bilateral adnexa are unremarkable. Other: Nonspecific trace volume intraperitoneal free fluid. No intraperitoneal free gas. No organized fluid  collection. Musculoskeletal: No abdominal wall hernia or abnormality. No suspicious lytic or blastic osseous lesions. No acute displaced fracture. Intramedullary nail fixation of the partially visualized proximal right femur. IMPRESSION: No acute intra-abdominal or intrapelvic abnormality. Electronically Signed   By: Morgane  Naveau M.D.   On: 06/19/2024 23:05   DG Chest 2 View Result Date: 06/19/2024 CLINICAL DATA:  Chest pain. EXAM: CHEST - 2 VIEW COMPARISON:  01/25/2023. FINDINGS: Normal heart, mediastinum and hila. Clear lungs.  No pleural effusion or pneumothorax. Skeletal structures are within normal limits. IMPRESSION: Normal chest radiographs. Electronically Signed   By: Alm Parkins M.D.   On: 06/19/2024 20:09    Family Communication: Discussed with patient, understand and agree. All questions answered.  Disposition: Status is: Observation The patient remains OBS appropriate and will d/c before 2 midnights.  Planned Discharge Destination: Home     Time spent: 39 minutes  Author: Concepcion Riser, MD 06/20/2024 2:03 PM Secure chat 7am to 7pm For on call review [email or web address].

## 2024-06-20 NOTE — Assessment & Plan Note (Signed)
-  Spoke about importance of quitting spent 5 minutes discussing options for treatment, prior attempts at quitting, and dangers of smoking ? -At this point patient is    interested in quitting ? - order nicotine patch  ? - nursing tobacco cessation protocol ? ?

## 2024-06-20 NOTE — H&P (Signed)
 Audrey Schultz FMW:[id number] DOB: 1991-09-25 DOA: 06/19/2024     PCP: Billy Knee, FNP     Patient arrived to ER on 06/19/24 at 1918 Referred by Attending Silvester Ales, MD   Patient coming from:    home Lives   With family     Chief Complaint: Generalized malaise alcohol  abuse, tobacco abuse, marijuana abuse, anemia   HPI: Audrey Schultz is a 33 y.o. female with medical history significant of alcohol  abuse, tobacco abuse, marijuana abuse   Presented with subjective fevers generalized malaise diarrhea and back pain Patient with daily alcohol  abuse history Presents with fatigue myalgias back pain diarrhea Chills and bodyaches Subjective fevers She drinks about 1/5 a day last drink was states yesterday States she has not experienced withdrawals because she never stops drinking but per records she has been admitted back in June for alcohol  withdrawals requiring CIWA protocol Denies history of seizures secondary to alcohol  withdrawal but states she occasionally has jerking movements of her body while remaining conscious Denies sick contacts Denies dysuria Patient is interested in quitting drinking. Feels like she is going through alcohol  withdrawal she is anxious and tremulous Not endorsing any urinary or bowel incontinence or saddle anesthesia    significant ETOH intake for 5-day Does   smoke  but interested in quitting    marijuana use daily    Regarding pertinent Chronic problems:    Chronic anemia - baseline hg Hemoglobin & Hematocrit  Recent Labs    03/10/24 1601 05/07/24 0407 06/19/24 1936  HGB 12.6 11.5* 11.2*   Iron /TIBC/Ferritin/ %Sat    Component Value Date/Time   IRON  35 01/07/2020 1012   TIBC 291 01/07/2020 1012   FERRITIN 128 01/07/2020 1012   IRONPCTSAT 12 01/07/2020 1012     While in ER: Clinical Course as of 06/20/24 0005  Sat Jun 19, 2024  2204 CBC(!) No leukocytosis.  Metabolic panel showed a slight increase in LFTs. [JK]   2204 Chest x-ray without acute findings [JK]  2221 I-Stat CG4 Lactic Acid(!!) elevated [JK]  2221 Chest x-ray without acute findings [JK]  2328 I-Stat CG4 Lactic Acid(!!) Lactic acid level remains elevated but improving. [JK]  2328 CT scan does not show any acute abnormality. [JK]  2337 Case discussed with Dr Silvester regarindg admisison [JK]    Clinical Course User Index [JK] Randol Simmonds, MD       Lab Orders         Blood culture (routine x 2)         Resp panel by RT-PCR (RSV, Flu A&B, Covid) Anterior Nasal Swab         Gastrointestinal Panel by PCR , Stool         Respiratory (~20 pathogens) panel by PCR         Urine Culture (for pregnant, neutropenic or urologic patients or patients with an indwelling urinary catheter)         MRSA Next Gen by PCR, Nasal         CBC         Comprehensive metabolic panel         Lipase, blood         Urinalysis, Routine w reflex microscopic -Urine, Clean Catch         hCG, serum, qualitative         Rapid urine drug screen (hospital performed)         Ammonia         Ethanol  Lactic acid, plasma         Procalcitonin         Vitamin B1         CK         Magnesium          Phosphorus         Prealbumin         Hepatitis panel, acute         Urinalysis, Complete w Microscopic -Urine, Clean Catch         Sedimentation rate         C-reactive protein         CBC with Differential         Protime-INR         APTT         I-Stat CG4 Lactic Acid      CXR -  NON acute  CTabd/pelvis -  non acute    Following Medications were ordered in ER: Medications  diazepam  (VALIUM ) injection 5 mg (has no administration in time range)  ketorolac  (TORADOL ) 15 MG/ML injection 15 mg (has no administration in time range)  morphine  (PF) 4 MG/ML injection 4 mg (has no administration in time range)  lactated ringers  bolus 1,000 mL (has no administration in time range)  thiamine  (VITAMIN B1) injection 100 mg (has no administration in time range)   0.9 %  sodium chloride  infusion (has no administration in time range)  cefTRIAXone  (ROCEPHIN ) 2 g in sodium chloride  0.9 % 100 mL IVPB (has no administration in time range)  metroNIDAZOLE  (FLAGYL ) IVPB 500 mg (has no administration in time range)  LORazepam  (ATIVAN ) tablet 1-4 mg (has no administration in time range)    Or  LORazepam  (ATIVAN ) injection 1-4 mg (has no administration in time range)  thiamine  (VITAMIN B1) tablet 100 mg (has no administration in time range)    Or  thiamine  (VITAMIN B1) injection 100 mg (has no administration in time range)  folic acid  (FOLVITE ) tablet 1 mg (has no administration in time range)  multivitamin with minerals tablet 1 tablet (has no administration in time range)  nicotine  (NICODERM CQ  - dosed in mg/24 hours) patch 21 mg (has no administration in time range)  ketorolac  (TORADOL ) 15 MG/ML injection 15 mg (has no administration in time range)  fentaNYL  (SUBLIMAZE ) injection 25 mcg (has no administration in time range)  potassium chloride  SA (KLOR-CON  M) CR tablet 20 mEq (has no administration in time range)  lactated ringers  bolus 1,000 mL (1,000 mLs Intravenous New Bag/Given 06/19/24 2211)  diazepam  (VALIUM ) injection 10 mg (10 mg Intravenous Given 06/19/24 2217)  iohexol  (OMNIPAQUE ) 350 MG/ML injection 75 mL (75 mLs Intravenous Contrast Given 06/19/24 2253)       ED Triage Vitals  Encounter Vitals Group     BP 06/19/24 1927 (!) 138/105     Girls Systolic BP Percentile --      Girls Diastolic BP Percentile --      Boys Systolic BP Percentile --      Boys Diastolic BP Percentile --      Pulse Rate 06/19/24 1927 (!) 110     Resp 06/19/24 1927 18     Temp 06/19/24 1927 100.2 F (37.9 C)     Temp Source 06/19/24 1927 Oral     SpO2 06/19/24 1927 100 %     Weight 06/19/24 1922 135 lb (61.2 kg)     Height --      Head Circumference --  Peak Flow --      Pain Score 06/19/24 1922 10     Pain Loc --      Pain Education --      Exclude from  Growth Chart --   UFJK(75)@     _________________________________________ Significant initial  Findings: Abnormal Labs Reviewed  CBC - Abnormal; Notable for the following components:      Result Value   RBC 3.57 (*)    Hemoglobin 11.2 (*)    HCT 33.8 (*)    All other components within normal limits  COMPREHENSIVE METABOLIC PANEL WITH GFR - Abnormal; Notable for the following components:   BUN <5 (*)    AST 66 (*)    All other components within normal limits  I-STAT CG4 LACTIC ACID, ED - Abnormal; Notable for the following components:   Lactic Acid, Venous 2.8 (*)    All other components within normal limits      _________________________ Troponin  ordered Cardiac Panel (last 3 results) Recent Labs    06/19/24 1936 06/19/24 2203  TROPONINIHS 3 4     ECG: Ordered Personally reviewed and interpreted by me showing: HR : 101 Rhythm: Sinus tachycardia Low voltage QRS Cannot rule out Anterior infarct , age undetermined Abnormal ECG QTC 464  _____________ This patient meets SIRS Criteria and may be septic.   The recent clinical data is shown below. Vitals:   06/19/24 1922 06/19/24 1927 06/19/24 1949 06/19/24 2240  BP:  (!) 138/105 (!) 138/105 (!) 155/104  Pulse:  (!) 110 (!) 110 87  Resp:  18  19  Temp:  100.2 F (37.9 C)  100 F (37.8 C)  TempSrc:  Oral  Oral  SpO2:  100%  100%  Weight: 61.2 kg       WBC     Component Value Date/Time   WBC 5.2 06/19/2024 1936   LYMPHSABS 3.5 05/07/2024 0407   MONOABS 0.4 05/07/2024 0407   EOSABS 0.0 05/07/2024 0407   BASOSABS 0.0 05/07/2024 0407     Lactic Acid, Venous    Component Value Date/Time   LATICACIDVEN 2.8 (HH) 06/19/2024 2216     Procalcitonin   Ordered      UA  ordered    Results for orders placed or performed during the hospital encounter of 03/01/24  Urine Culture     Status: Abnormal   Collection Time: 03/01/24  1:14 PM   Specimen: Urine, Clean Catch  Result Value Ref Range Status   Specimen  Description   Final    URINE, CLEAN CATCH Performed at [facility], [address], [place], [place] [address]    Special Requests   Final    NONE Performed at [facility], [address], [place], [place] [address]    Culture >=100,000 COLONIES/mL KLEBSIELLA PNEUMONIAE (A)  Final   Report Status 03/03/2024 FINAL  Final   Organism ID, Bacteria KLEBSIELLA PNEUMONIAE (A)  Final      Susceptibility   Klebsiella pneumoniae - MIC*    AMPICILLIN RESISTANT Resistant     CEFAZOLIN  <=4 SENSITIVE Sensitive     CEFEPIME <=0.12 SENSITIVE Sensitive     CEFTRIAXONE  <=0.25 SENSITIVE Sensitive     CIPROFLOXACIN <=0.25 SENSITIVE Sensitive     GENTAMICIN <=1 SENSITIVE Sensitive     IMIPENEM <=0.25 SENSITIVE Sensitive     NITROFURANTOIN 64 INTERMEDIATE Intermediate     TRIMETH /SULFA  <=20 SENSITIVE Sensitive     AMPICILLIN/SULBACTAM 4 SENSITIVE Sensitive  PIP/TAZO <=4 SENSITIVE Sensitive ug/mL    * >=100,000 COLONIES/mL KLEBSIELLA PNEUMONIAE    ABX started Rocephin  metronidazole     __________________________________________________________ Recent Labs  Lab 06/19/24 1936  NA 139  K 3.5  CO2 22  GLUCOSE 93  BUN <5*  CREATININE 0.73  CALCIUM 8.9    Cr stable,   Lab Results  Component Value Date   CREATININE 0.73 06/19/2024   CREATININE 0.71 05/07/2024   CREATININE 1.04 03/10/2024    Recent Labs  Lab 06/19/24 1936  AST 66*  ALT 33  ALKPHOS 66  BILITOT 0.4  PROT 7.3  ALBUMIN 4.0   Lab Results  Component Value Date   CALCIUM 8.9 06/19/2024   PHOS 2.6 03/03/2024       Plt: Lab Results  Component Value Date   PLT 275 06/19/2024         Recent Labs  Lab 06/19/24 1936  WBC 5.2  HGB 11.2*  HCT 33.8*  MCV 94.7  PLT 275    HG/HCT  stable,       Component Value Date/Time   HGB 11.2 (L) 06/19/2024 1936   HCT 33.8 (L) 06/19/2024 1936   MCV 94.7 06/19/2024 1936     Recent Labs  Lab 06/19/24 1936  LIPASE 21   No  results for input(s): AMMONIA in the last 168 hours.    _______________________________________________ Hospitalist was called for admission for   Alcohol  withdrawal syndrome with complication, SIRS   The following Work up has been ordered so far:  Orders Placed This Encounter  Procedures   Critical Care   Blood culture (routine x 2)   Resp panel by RT-PCR (RSV, Flu A&B, Covid) Anterior Nasal Swab   Gastrointestinal Panel by PCR , Stool   Respiratory (~20 pathogens) panel by PCR   Urine Culture (for pregnant, neutropenic or urologic patients or patients with an indwelling urinary catheter)   MRSA Next Gen by PCR, Nasal   DG Chest 2 View   CT ABDOMEN PELVIS W CONTRAST   CT L-SPINE NO CHARGE   CBC   Comprehensive metabolic panel   Lipase, blood   Urinalysis, Routine w reflex microscopic -Urine, Clean Catch   hCG, serum, qualitative   Rapid urine drug screen (hospital performed)   Ammonia   Ethanol   Lactic acid, plasma   Procalcitonin   Vitamin B1   CK   Magnesium    Phosphorus   Prealbumin   Hepatitis panel, acute   Urinalysis, Complete w Microscopic -Urine, Clean Catch   Sedimentation rate   C-reactive protein   CBC with Differential   Protime-INR   APTT   Document Height and Actual Weight   In and Out Cath   Cardiac Monitoring - Continuous Indefinite   Apply Sepsis Care Plan   If lactate (lactic acid) >2, verify repeat lactic acid order has been placed to be drawn   Document vital signs within 1-hour of fluid bolus completion and notify provider of bolus completion   Vital signs   Vital signs   Assess and Document Glasgow Coma Scale   Cardiac Monitoring - Continuous Indefinite   Clinical Institute Withdrawal Assessent (CIWA)   Notify Pharmacy to change IV Ativan  to PO if tolerating POs well.   If Ativan  given, reassess Clinical Institute Withdrawal Assessment (CIWA) with blood pressure and pulse rate within 1 hour of Ativan  administration   Refer to Sidebar  Report to reference: ETOH Withdrawal Guidelines   Cardiac monitoring   Notify physician (specify)  Nurse to provide smoking / tobacco cessation education   Full code   Consult to hospitalist   Pharmacy Consult   Consult to Transition of Care Team   Nutritional services consult   Consult to Transition of Care Team   Droplet precaution   I-Stat CG4 Lactic Acid   EKG 12-Lead   Place in observation (patient's expected length of stay will be less than 2 midnights)     OTHER Significant initial  Findings:  labs showing:     DM  labs:  HbA1C: No results for input(s): HGBA1C in the last 8760 hours.     CBG (last 3)  No results for input(s): GLUCAP in the last 72 hours.        Cultures:    Component Value Date/Time   SDES  03/01/2024 1314    URINE, CLEAN CATCH Performed at [facility], [address], [place], [place] [address]    SPECREQUEST  03/01/2024 1314    NONE Performed at [facility], [address] [place], [place] [address]    CULT >=100,000 COLONIES/mL KLEBSIELLA PNEUMONIAE (A) 03/01/2024 1314   REPTSTATUS 03/03/2024 FINAL 03/01/2024 1314     Radiological Exams on Admission: CT ABDOMEN PELVIS W CONTRAST Result Date: 06/19/2024 CLINICAL DATA:  Abdominal pain, acute, nonlocalized EXAM: CT ABDOMEN AND PELVIS WITH CONTRAST TECHNIQUE: Multidetector CT imaging of the abdomen and pelvis was performed using the standard protocol following bolus administration of intravenous contrast. RADIATION DOSE REDUCTION: This exam was performed according to the departmental dose-optimization program which includes automated exposure control, adjustment of the mA and/or kV according to patient size and/or use of iterative reconstruction technique. CONTRAST:  75mL OMNIPAQUE  IOHEXOL  350 MG/ML SOLN COMPARISON:  CT abdomen pelvis 03/01/2024 FINDINGS: Lower chest: No acute abnormality. Hepatobiliary: Focal hypodensity along falciform ligament  likely focal fatty infiltration. No gallstones, gallbladder wall thickening, or pericholecystic fluid. No biliary dilatation. Pancreas: No focal lesion. Normal pancreatic contour. No surrounding inflammatory changes. No main pancreatic ductal dilatation. Spleen: Normal in size without focal abnormality. Adrenals/Urinary Tract: No adrenal nodule bilaterally. Bilateral kidneys enhance symmetrically. No hydronephrosis. No hydroureter. The urinary bladder is unremarkable. Stomach/Bowel: Stomach is within normal limits. No evidence of bowel wall thickening or dilatation. The appendix is not definitely identified with no inflammatory changes in the right lower quadrant to suggest acute appendicitis. Vascular/Lymphatic: No abdominal aorta or iliac aneurysm. No abdominal, pelvic, or inguinal lymphadenopathy. Reproductive: Uterus and bilateral adnexa are unremarkable. Other: Nonspecific trace volume intraperitoneal free fluid. No intraperitoneal free gas. No organized fluid collection. Musculoskeletal: No abdominal wall hernia or abnormality. No suspicious lytic or blastic osseous lesions. No acute displaced fracture. Intramedullary nail fixation of the partially visualized proximal right femur. IMPRESSION: No acute intra-abdominal or intrapelvic abnormality. Electronically Signed   By: Morgane  Naveau M.D.   On: 06/19/2024 23:05   DG Chest 2 View Result Date: 06/19/2024 CLINICAL DATA:  Chest pain. EXAM: CHEST - 2 VIEW COMPARISON:  01/25/2023. FINDINGS: Normal heart, mediastinum and hila. Clear lungs.  No pleural effusion or pneumothorax. Skeletal structures are within normal limits. IMPRESSION: Normal chest radiographs. Electronically Signed   By: Alm Parkins M.D.   On: 06/19/2024 20:09   _______________________________________________________________________________________________________ Latest  Blood pressure (!) 155/104, pulse 87, temperature 100 F (37.8 C), temperature source Oral, resp. rate 19, weight 61.2  kg, SpO2 100%.   Vitals  labs and radiology finding personally reviewed  Review of Systems:    Pertinent positives include:  Fevers, chills, fatigue  diarrhea,back pain.  Constitutional:  No weight loss, night sweats, , weight loss  HEENT:  No headaches, Difficulty swallowing,Tooth/dental problems,Sore throat,  No sneezing, itching, ear ache, nasal congestion, post nasal drip,  Cardio-vascular:  No chest pain, Orthopnea, PND, anasarca, dizziness, palpitations.no Bilateral lower extremity swelling  GI:  No heartburn, indigestion, abdominal pain, nausea, vomiting, change in bowel habits, loss of appetite, melena, blood in stool, hematemesis Resp:  no shortness of breath at rest. No dyspnea on exertion, No excess mucus, no productive cough, No non-productive cough, No coughing up of blood.No change in color of mucus.No wheezing. Skin:  no rash or lesions. No jaundice GU:  no dysuria, change in color of urine, no urgency or frequency. No straining to urinate.  No flank pain.  Musculoskeletal:  No joint pain or no joint swelling. No decreased range of motion. No  Psych:  No change in mood or affect. No depression or anxiety. No memory loss.  Neuro: no localizing neurological complaints, no tingling, no weakness, no double vision, no gait abnormality, no slurred speech, no confusion  All systems reviewed and apart from HOPI all are negative _______________________________________________________________________________________________ Past Medical History:   Past Medical History:  Diagnosis Date   Anemia    hx w/pregnancy   Dysmenorrhea    Medical history non-contributory    Nicotine  dependence 01/10/2020   Vitamin D  deficiency 01/10/2020     Past Surgical History:  Procedure Laterality Date   INTRAMEDULLARY (IM) NAIL INTERTROCHANTERIC Right 01/07/2020   Procedure: INTRAMEDULLARY (IM) NAIL INTERTROCHANTRIC;  Surgeon: Celena Sharper, MD;  Location: [facility] OR;  Service: Orthopedics;   Laterality: Right;   ORIF ANKLE FRACTURE Left 01/20/2020   Procedure: OPEN REDUCTION INTERNAL FIXATION (ORIF) ANKLE FRACTURE;  Surgeon: Celena Sharper, MD;  Location: [facility] OR;  Service: Orthopedics;  Laterality: Left;    Social History:  Ambulatory   independently       reports that she has been smoking cigarettes. She has a 2.8 pack-year smoking history. She has never used smokeless tobacco. She reports current alcohol  use of about 4.0 - 5.0 standard drinks of alcohol  per week. She reports current drug use. Drug: Marijuana.   Family History:   Family History  Problem Relation Age of Onset   Hypertension Mother    Diabetes Mother    _____ ____________________________________________________________________ Allergies: Allergies  Allergen Reactions   Shellfish Allergy Anaphylaxis    Prior to Admission medications   Medication Sig Start Date End Date Taking? Authorizing Provider  medroxyPROGESTERone  (DEPO-PROVERA ) 150 MG/ML injection Inject 1 mL (150 mg total) into the muscle every 3 (three) months. 06/13/24  Yes Eveline Lynwood MATSU, MD  acetaminophen  (TYLENOL ) 325 MG tablet Take 650 mg by mouth every 6 (six) hours as needed for moderate pain (pain score 4-6).    [provider]  cyclobenzaprine  (FLEXERIL ) 10 MG tablet Take 0.5-1 tablets (5-10 mg total) by mouth at bedtime. 02/29/24   Veta Palma, PA-C  docusate sodium  (COLACE) 100 MG capsule Take 1 capsule (100 mg total) by mouth 2 (two) times daily. 03/10/24   Billy Knee, FNP  ibuprofen  (ADVIL ) 800 MG tablet Take 1 tablet (800 mg total) by mouth 3 (three) times daily with meals as needed for headache, moderate pain (pain score 4-6) or cramping. 01/06/24   Zina Jerilynn LABOR, MD  Iron , Ferrous Sulfate , 325 (65 Fe) MG TABS Take 325 mg by mouth daily. Patient not taking: Reported on 03/30/2024 12/09/23   Billy Knee, FNP  Multiple Vitamin (MULTIVITAMIN WITH MINERALS) TABS tablet  Take 1 tablet by mouth daily. 03/19/22   Ghimire, Donalda HERO,  MD  ondansetron  (ZOFRAN -ODT) 4 MG disintegrating tablet Take 1 tablet (4 mg total) by mouth every 8 (eight) hours as needed for nausea or vomiting. 02/29/24   Veta Palma, PA-C  polyethylene glycol powder (GLYCOLAX /MIRALAX ) 17 GM/SCOOP powder Take 17 g by mouth daily as needed for moderate constipation. 03/10/24   Billy Knee, FNP    ___________________________________________________________________________________________________ Physical Exam:    06/19/2024   10:40 PM 06/19/2024    7:49 PM 06/19/2024    7:27 PM  Vitals with BMI  Systolic 155 138 861  Diastolic 104 105 894  Pulse 87 110 110    1. General:  in No  Acute distress    Chronically ill   -appearing 2. Psychological: Alert and   Oriented 3. Head/ENT:  Dry Mucous Membranes                          Head Non traumatic, neck supple                         Poor Dentition 4. SKIN:  decreased Skin turgor,  Skin clean Dry and intact no rash    5. Heart: Regular rate and rhythm no  Murmur, no Rub or gallop 6. Lungs:  no wheezes or crackles   7. Abdomen: Soft,  non-tender, Non distended   obese  bowel sounds present 8. Lower extremities: no clubbing, cyanosis, no  edema 9. Neurologically Grossly intact, moving all 4 extremities equally ambulates without difficulty 10. MSK: Normal range of motion Paraspinous tenderness to palpation of the lumbar spine no step-off  Chart has been reviewed  ______________________________________________________________________________________________  Assessment/Plan 33 y.o. female with medical history significant of alcohol  abuse, tobacco abuse, marijuana abuse  Admitted for   Alcohol  withdrawal syndrome with complication  and SIRS    Present on Admission:  Alcohol  withdrawal (HCC)  SIRS (systemic inflammatory response syndrome) (HCC)  Fever  Iron  deficiency anemia secondary to inadequate dietary iron  intake  Marijuana abuse  Alcohol  abuse  Tobacco abuse  Back pain    SIRS  (systemic inflammatory response syndrome) (HCC)  -SIRS criteria met with     tachycardia   ,   fever   Today's Vitals   06/19/24 1922 06/19/24 1927 06/19/24 1949 06/19/24 2240  BP:  (!) 138/105 (!) 138/105 (!) 155/104  Pulse:  (!) 110 (!) 110 87  Resp:  18  19  Temp:  100.2 F (37.9 C)  100 F (37.8 C)  TempSrc:  Oral  Oral  SpO2:  100%  100%  Weight: 61.2 kg     PainSc: 10-Worst pain ever        The recent clinical data is shown below. Vitals:   06/19/24 1922 06/19/24 1927 06/19/24 1949 06/19/24 2240  BP:  (!) 138/105 (!) 138/105 (!) 155/104  Pulse:  (!) 110 (!) 110 87  Resp:  18  19  Temp:  100.2 F (37.9 C)  100 F (37.8 C)  TempSrc:  Oral  Oral  SpO2:  100%  100%  Weight: 61.2 kg        -Most likely source being: Source of sepsis is unknown but given clinical picture will continue to treat   Patient meeting criteria for Severe sepsis with    evidence of end organ damage/organ dysfunction such as  elevated lactic acid >2     Component Value  Date/Time   LATICACIDVEN 2.8 (HH) 06/19/2024 2216       - Obtain serial lactic acid and procalcitonin level.  - Initiated IV antibiotics     Rocephin  flagyl      - await results of blood and urine culture  - Rehydrate aggressively  Intravenous fluids were administered,      11:49 PM   Fever Evaluate for sources of infection Blood cultures pending CT abdomen nonacute chest x-ray showing no evidence of pneumonia Patient is endorsing back pain will obtain CT lumbar spine read Obtain respiratory panel UA pending Alcohol  withdrawal could be also source of fever as well given the so far has been low-grade For right now covered with Rocephin  Flagyl  given abdominal complaints  Iron  deficiency anemia secondary to inadequate dietary iron  intake Obtain anemia panel restart iron  supplements when able to tolerate  Marijuana abuse Spoke about importance of quitting  Alcohol  abuse Order CIWA protocol  Tobacco abuse  -  Spoke about importance of quitting spent 5 minutes discussing options for treatment, prior attempts at quitting, and dangers of smoking  -At this point patient is     interested in quitting  - order nicotine  patch   - nursing tobacco cessation protocol   Back pain Started tonight In the setting of alcohol  abuse generalized malaise diarrhea and other complaints but there is also low-grade fever For completion will obtain CT lumbar spine read CT abdomen already done Check sed rate and CRP Reassess if needs further workup   Other plan as per orders.  DVT prophylaxis:  SCD     Code Status:    Code Status: Full Code FULL CODE  as per patient   I had personally discussed CODE STATUS with patient  ACP   none    Family Communication:   Family not at  Bedside    Diet heart healthy    Disposition Plan:     To home once workup is complete and patient is stable   Following barriers for discharge:                                                       Electrolytes corrected                                                           Pain controlled with PO medications                               Afebrile, white count improving able to transition to PO antibiotics        Consult Orders  (From admission, onward)           Start     Ordered   06/20/24 2356  Nutritional services consult  Once       Provider:  (Not yet assigned)  Question:  Reason for Consult?  Answer:  assess nutrtional status   06/19/24 2356   06/19/24 2336  Consult to hospitalist  Paged by lonell  Once       Provider:  (Not yet assigned)  Question Answer Comment  Place call to: [facility]   Reason for Consult Admit      06/19/24 2335                              Transition of care consulted                     Consults called:    NONE   Admission status:  ED Disposition     ED Disposition  Admit   Condition  --   Comment  Hospital Area: [facility] [[id number]]  Level of  Care: Progressive [102]  Admit to Progressive based on following criteria: NEUROLOGICAL AND NEUROSURGICAL complex patients with significant risk of instability, who do not meet ICU criteria, yet require close observation or frequent assessment (< / = every 2 - 4 hours) with medical / nursing intervention.  May place patient in observation at [facility] or [facility] if equivalent level of care is available:: No  Covid Evaluation: Asymptomatic - no recent exposure (last 10 days) testing not required  Diagnosis: Alcohol  withdrawal (HCC) [291.81.ICD-9-CM]  Admitting Physician: Cesario Weidinger [[id number]]  Attending Physician: Ahmon Tosi [[id number]]  For patients discharging to extended facilities (i.e. SNF, AL, group homes or LTAC) initiate:: Discharge to SNF/Facility Placement COVID-19 Lab Testing Protocol           Obs    Level of care    progressive   Lamya Lausch 06/20/2024, 12:13 AM    [facility]     after 2 AM please page floor coverage   If 7AM-7PM, please contact the day team taking care of the patient using [email or web address]

## 2024-06-21 ENCOUNTER — Other Ambulatory Visit (HOSPITAL_COMMUNITY): Payer: Self-pay

## 2024-06-21 DIAGNOSIS — R6510 Systemic inflammatory response syndrome (SIRS) of non-infectious origin without acute organ dysfunction: Secondary | ICD-10-CM | POA: Diagnosis not present

## 2024-06-21 DIAGNOSIS — Z720 Tobacco use: Secondary | ICD-10-CM | POA: Diagnosis not present

## 2024-06-21 DIAGNOSIS — B348 Other viral infections of unspecified site: Secondary | ICD-10-CM | POA: Diagnosis not present

## 2024-06-21 DIAGNOSIS — F1010 Alcohol abuse, uncomplicated: Secondary | ICD-10-CM | POA: Diagnosis not present

## 2024-06-21 LAB — BASIC METABOLIC PANEL WITH GFR
Anion gap: 11 (ref 5–15)
BUN: <5 mg/dL — ABNORMAL LOW (ref 6–20)
CO2: 25 mmol/L (ref 22–32)
Calcium: 9.1 mg/dL (ref 8.9–10.3)
Chloride: 103 mmol/L (ref 98–111)
Creatinine, Ser: 0.65 mg/dL (ref 0.44–1.00)
GFR, Estimated: >60 mL/min (ref >60)
Glucose, Bld: 98 mg/dL (ref 70–99)
Potassium: 3 mmol/L — ABNORMAL LOW (ref 3.5–5.1)
Sodium: 139 mmol/L (ref 135–145)

## 2024-06-21 LAB — URINALYSIS, COMPLETE (UACMP) WITH MICROSCOPIC
Bacteria, UA: NONE SEEN
Bilirubin Urine: NEGATIVE
Glucose, UA: NEGATIVE mg/dL
Hgb urine dipstick: NEGATIVE
Ketones, ur: NEGATIVE mg/dL
Leukocytes,Ua: NEGATIVE
Nitrite: NEGATIVE
Protein, ur: NEGATIVE mg/dL
Specific Gravity, Urine: 1.008 (ref 1.005–1.030)
pH: 7 (ref 5.0–8.0)

## 2024-06-21 LAB — CBC
HCT: 31.3 % — ABNORMAL LOW (ref 36.0–46.0)
Hemoglobin: 10.5 g/dL — ABNORMAL LOW (ref 12.0–15.0)
MCH: 31.9 pg (ref 26.0–34.0)
MCHC: 33.5 g/dL (ref 30.0–36.0)
MCV: 95.1 fL (ref 80.0–100.0)
Platelets: 247 K/uL (ref 150–400)
RBC: 3.29 MIL/uL — ABNORMAL LOW (ref 3.87–5.11)
RDW: 14.2 % (ref 11.5–15.5)
WBC: 3.3 K/uL — ABNORMAL LOW (ref 4.0–10.5)
nRBC: 0 % (ref 0.0–0.2)

## 2024-06-21 LAB — RAPID URINE DRUG SCREEN, HOSP PERFORMED
Amphetamines: NOT DETECTED
Barbiturates: NOT DETECTED
Benzodiazepines: POSITIVE — AB
Cocaine: NOT DETECTED
Opiates: POSITIVE — AB
Tetrahydrocannabinol: POSITIVE — AB

## 2024-06-21 LAB — MAGNESIUM: Magnesium: 1.7 mg/dL (ref 1.7–2.4)

## 2024-06-21 LAB — URINE CULTURE: Culture: 10000 — AB

## 2024-06-21 MED ORDER — FOLIC ACID 1 MG PO TABS: 1.0000 mg | ORAL_TABLET | Freq: Every day | ORAL | 0 refills | Status: AC

## 2024-06-21 MED ORDER — ONDANSETRON 4 MG PO TBDP: 4.0000 mg | ORAL_TABLET | Freq: Three times a day (TID) | ORAL | 0 refills | Status: DC | PRN

## 2024-06-21 MED ORDER — ENSURE PLUS HIGH PROTEIN PO LIQD
237.0000 mL | Freq: Two times a day (BID) | ORAL | Status: DC
  Administered 2024-06-21: 237 mL via ORAL

## 2024-06-21 MED ORDER — POTASSIUM CHLORIDE CRYS ER 20 MEQ PO TBCR: 40.0000 meq | EXTENDED_RELEASE_TABLET | Freq: Every day | ORAL | Status: DC

## 2024-06-21 MED ORDER — CYCLOBENZAPRINE HCL 10 MG PO TABS
10.0000 mg | ORAL_TABLET | Freq: Three times a day (TID) | ORAL | Status: DC
  Administered 2024-06-21: 10 mg via ORAL
  Filled 2024-06-21: qty 1

## 2024-06-21 MED ORDER — VITAMIN B-1 100 MG PO TABS: 100.0000 mg | ORAL_TABLET | Freq: Every day | ORAL | 0 refills | Status: AC

## 2024-06-21 MED ORDER — ADULT MULTIVITAMIN W/MINERALS CH: 1.0000 | ORAL_TABLET | Freq: Every day | ORAL | 0 refills | Status: AC

## 2024-06-21 MED ORDER — POTASSIUM CHLORIDE 20 MEQ PO PACK
40.0000 meq | PACK | Freq: Two times a day (BID) | ORAL | Status: DC
  Administered 2024-06-21 (×2): 40 meq via ORAL
  Filled 2024-06-21 (×2): qty 2

## 2024-06-21 MED ORDER — NICOTINE 21 MG/24HR TD PT24: 21.0000 mg | MEDICATED_PATCH | Freq: Every day | TRANSDERMAL | 0 refills | Status: AC

## 2024-06-21 MED ORDER — CYCLOBENZAPRINE HCL 5 MG PO TABS: 5.0000 mg | ORAL_TABLET | Freq: Every day | ORAL | 0 refills | Status: AC

## 2024-06-21 MED ORDER — IBUPROFEN 800 MG PO TABS: 800.0000 mg | ORAL_TABLET | Freq: Three times a day (TID) | ORAL | 0 refills | Status: DC | PRN

## 2024-06-21 MED ORDER — MELATONIN 5 MG PO TABS
10.0000 mg | ORAL_TABLET | Freq: Every evening | ORAL | Status: DC | PRN
  Administered 2024-06-21: 10 mg via ORAL
  Filled 2024-06-21: qty 2

## 2024-06-21 MED ORDER — IRON (FERROUS SULFATE) 325 (65 FE) MG PO TABS: 325.0000 mg | ORAL_TABLET | Freq: Every day | ORAL | 1 refills | Status: AC

## 2024-06-21 NOTE — Progress Notes (Signed)
 Mobility Specialist Progress Note:    06/21/24 1505  Mobility  Activity Ambulated independently in hallway  Level of Assistance Independent  Assistive Device None  Distance Ambulated (ft) 100 ft  Activity Response Tolerated well  Mobility Referral Yes  Mobility visit 1 Mobility  Mobility Specialist Start Time (ACUTE ONLY) 1505  Mobility Specialist Stop Time (ACUTE ONLY) 1518  Mobility Specialist Time Calculation (min) (ACUTE ONLY) 13 min   Received pt ambulating in the room. Pt agreeable to session. No c/o any symptoms. Stated they were feeling bad earlier today but suddenly started feeling better this afternoon. Left pt in room ambulating w/ all need met and w/ NT  Venetia Keel Mobility Specialist Please Neurosurgeon or Rehab Office at [phone]

## 2024-06-21 NOTE — Evaluation (Signed)
 Physical Therapy Evaluation Patient Details Name: Audrey Schultz MRN: [id number] DOB: [birth year] Today's Date: 06/21/2024  History of Present Illness  Pt is a 33 yr old female who presented 06/19/24 due to weakness, diarrhea, back pain and body aches. Pt found to have alcohol  withdrawal, SIRS and polysubstance abuse. PMH: alcohol  abuse, tobacco abuse, marijuana abuse, IM nailing RLE  Clinical Impression  Patient reports being independent with mobility at baseline and lives in a home with multiple relatives.  Today she has tremors that increase with volitional activity. She required up to hand held assistance for safety with short distance ambulation with activity tolerance limited by fatigue. Anticipate mobility will improve with improvement in symptoms. PT will follow while in the hospital to maximize independence in preparation for discharge home with family support.     If plan is discharge home, recommend the following: Assist for transportation   Can travel by private vehicle        Equipment Recommendations None recommended by PT  Recommendations for Other Services       Functional Status Assessment Patient has had a recent decline in their functional status and demonstrates the ability to make significant improvements in function in a reasonable and predictable amount of time.     Precautions / Restrictions Precautions Precautions: Fall Recall of Precautions/Restrictions: Intact Precaution/Restrictions Comments: CIWA Restrictions Weight Bearing Restrictions Per Provider Order: No      Mobility  Bed Mobility Overal bed mobility: Needs Assistance Bed Mobility: Supine to Sit, Sit to Supine     Supine to sit: Modified independent (Device/Increase time) Sit to supine: Modified independent (Device/Increase time)   General bed mobility comments: increased time    Transfers Overall transfer level: Needs assistance Equipment used: 1 person hand held assist Transfers:  Sit to/from Stand Sit to Stand: Contact guard assist           General transfer comment: CGA for standing from bed x 2 bouts and from toilet    Ambulation/Gait Ambulation/Gait assistance: Contact guard assist Gait Distance (Feet): 30 Feet Assistive device: 1 person hand held assist Gait Pattern/deviations: Step-through pattern, Decreased stride length Gait velocity: decreased     General Gait Details: temor throughout ambulation bout with HHA provided for safety. no dyspnea with exertion  Stairs            Wheelchair Mobility     Tilt Bed    Modified Rankin (Stroke Patients Only)       Balance Overall balance assessment: Needs assistance Sitting-balance support: Feet supported Sitting balance-Leahy Scale: Good     Standing balance support: No upper extremity supported, Single extremity supported Standing balance-Leahy Scale: Poor Standing balance comment: with dyanmic activity, increased temor requiring HHA or sink for support                             Pertinent Vitals/Pain Pain Assessment Pain Assessment: Faces Faces Pain Scale: Hurts a little bit Pain Location: generalized Pain Descriptors / Indicators: Discomfort Pain Intervention(s): Limited activity within patient's tolerance    Home Living Family/patient expects to be discharged to:: Private residence Living Arrangements: Other relatives (grandmother, uncle, 9 year old daughter) Available Help at Discharge: Family Type of Home: House Home Access: Stairs to enter Entrance Stairs-Rails: Right;Left;Can reach both Entrance Stairs-Number of Steps: 4   Home Layout: One level Home Equipment: None      Prior Function Prior Level of Function : Independent/Modified Independent  Mobility Comments: independent ADLs Comments: independent     Extremity/Trunk Assessment   Upper Extremity Assessment Upper Extremity Assessment: Generalized weakness    Lower Extremity  Assessment Lower Extremity Assessment: Generalized weakness (tremor with volitional activity BLE)    Cervical / Trunk Assessment Cervical / Trunk Assessment: Kyphotic  Communication   Communication Communication: No apparent difficulties    Cognition Arousal: Alert Behavior During Therapy: Flat affect   PT - Cognitive impairments: No apparent impairments                         Following commands: Intact       Cueing Cueing Techniques: Verbal cues     General Comments General comments (skin integrity, edema, etc.): activity tolerance limited by fatigue    Exercises     Assessment/Plan    PT Assessment Patient needs continued PT services  PT Problem List Decreased strength;Decreased activity tolerance;Decreased range of motion;Decreased balance;Decreased mobility       PT Treatment Interventions DME instruction;Stair training;Gait training;Functional mobility training;Therapeutic activities;Therapeutic exercise;Neuromuscular re-education;Balance training    PT Goals (Current goals can be found in the Care Plan section)  Acute Rehab PT Goals Patient Stated Goal: to feel better and return home PT Goal Formulation: With patient Time For Goal Achievement: 07/05/24 Potential to Achieve Goals: Fair    Frequency Min 1X/week     Co-evaluation               AM-PAC PT 6 Clicks Mobility  Outcome Measure Help needed turning from your back to your side while in a flat bed without using bedrails?: None Help needed moving from lying on your back to sitting on the side of a flat bed without using bedrails?: None Help needed moving to and from a bed to a chair (including a wheelchair)?: A Little Help needed standing up from a chair using your arms (e.g., wheelchair or bedside chair)?: A Little Help needed to walk in hospital room?: A Little Help needed climbing 3-5 steps with a railing? : A Little 6 Click Score: 20    End of Session   Activity Tolerance:  Patient limited by fatigue Patient left: in bed;with call bell/phone within reach Nurse Communication: Mobility status PT Visit Diagnosis: Unsteadiness on feet (R26.81);Other abnormalities of gait and mobility (R26.89)    Time: 1020-1036 PT Time Calculation (min) (ACUTE ONLY): 16 min   Charges:   PT Evaluation $PT Eval Low Complexity: 1 Low   PT General Charges $$ ACUTE PT VISIT: 1 Visit         Audrey Schultz, PT, MPT   Audrey Schultz 06/21/2024, 10:45 AM

## 2024-06-21 NOTE — Progress Notes (Signed)
 IVT consult for piv access:  Pt insisting on using smallest needle; stated she's tired of being poked ;explained need for appropriate PIV access, assessed LLFA, no suitable vein found; LUA assessed for midline and noted good cephalic vein but pt refused insisting on using the smallest needle; pulled arm away and stated she can just go home if they keep poking me. RN notified for refusal.

## 2024-06-21 NOTE — TOC Transition Note (Signed)
 Transition of Care [facility]) - Discharge Note   Patient Details  Name: Audrey Schultz MRN: [id number] Date of Birth: [birth year]  [facility] of [facility]) CM/SW Contact:  Waddell Barnie Rama, RN Phone Number: 06/21/2024, 3:18 PM   Clinical Narrative:     For dc home today, she has transport. No needs.        Patient Goals and CMS Choice            Discharge Placement                       Discharge Plan and Services Additional resources added to the After Visit Summary for                                       Social Drivers of Health (SDOH) Interventions SDOH Screenings   Food Insecurity: Patient Declined (06/20/2024)  Housing: Unknown (06/20/2024)  Transportation Needs: Patient Declined (06/20/2024)  Utilities: Patient Declined (06/20/2024)  Depression (PHQ2-9): Low Risk  (03/10/2024)  Tobacco Use: High Risk (06/19/2024)     Readmission Risk Interventions    03/02/2024   11:33 AM  Readmission Risk Prevention Plan  Post Dischage Appt Complete  Medication Screening Complete  Transportation Screening Complete

## 2024-06-21 NOTE — Discharge Summary (Signed)
 Physician Discharge Summary   Patient: Audrey Schultz MRN: [id number] DOB: [birth year]  Admit date:     06/19/2024  Discharge date: 06/21/24  Discharge Physician: Concepcion Riser   PCP: Billy Knee, FNP   Recommendations at discharge:  {Tip this will not be part of the note when signed- Example include specific recommendations for outpatient follow-up, pending tests to follow-up on. (Optional):26781}  ***  Discharge Diagnoses: Principal Problem:   Alcohol  withdrawal (HCC) Active Problems:   SIRS (systemic inflammatory response syndrome) (HCC)   Alcohol  abuse   Marijuana abuse   Iron  deficiency anemia secondary to inadequate dietary iron  intake   Fever   Tobacco abuse   Back pain  Resolved Problems:   * No resolved hospital problems. [facility] Hospital Course: No notes on file  Assessment and Plan: No notes have been filed under this hospital service. Service: Hospitalist     {Tip this will not be part of the note when signed Body mass index is 24.53 kg/m. ,  Nutrition Documentation    Flowsheet Row ED to Hosp-Admission (Current) from 06/19/2024 in [facility] 3E HF PCU  Nutrition Problem Inadequate oral intake  Etiology acute illness, social / environmental circumstances  Nutrition Goal Patient will meet greater than or equal to 90% of their needs  Interventions Refer to RD note for recommendations  ,  (Optional):26781}  {(NOTE) Pain control PDMP Statment (Optional):26782} Consultants: *** Procedures performed: ***  Disposition: {Plan; Disposition:26390} Diet recommendation:  Discharge Diet Orders (From admission, onward)     Start     Ordered   06/21/24 0000  Diet - low sodium heart healthy        06/21/24 1502           {Diet_Plan:26776} DISCHARGE MEDICATION: Allergies as of 06/21/2024       Reactions   Shellfish Allergy Anaphylaxis        Medication List     TAKE these medications    cyclobenzaprine  5 MG tablet Commonly known  as: FLEXERIL  Take 1 tablet (5 mg total) by mouth at bedtime. What changed:  medication strength how much to take   docusate sodium  100 MG capsule Commonly known as: Colace Take 1 capsule (100 mg total) by mouth 2 (two) times daily.   folic acid  1 MG tablet Commonly known as: FOLVITE  Take 1 tablet (1 mg total) by mouth daily. Start taking on: June 22, 2024   ibuprofen  800 MG tablet Commonly known as: ADVIL  Take 1 tablet (800 mg total) by mouth 3 (three) times daily with meals as needed for headache, moderate pain (pain score 4-6) or cramping.   Iron  (Ferrous Sulfate ) 325 (65 Fe) MG Tabs Take 325 mg by mouth daily.   medroxyPROGESTERone  150 MG/ML injection Commonly known as: DEPO-PROVERA  Inject 1 mL (150 mg total) into the muscle every 3 (three) months.   multivitamin with minerals Tabs tablet Take 1 tablet by mouth daily. Start taking on: June 22, 2024   nicotine  21 mg/24hr patch Commonly known as: NICODERM CQ  - dosed in mg/24 hours Place 1 patch (21 mg total) onto the skin daily. Start taking on: June 22, 2024   ondansetron  4 MG disintegrating tablet Commonly known as: ZOFRAN -ODT Take 1 tablet (4 mg total) by mouth every 8 (eight) hours as needed for nausea or vomiting.   polyethylene glycol powder 17 GM/SCOOP powder Commonly known as: GLYCOLAX /MIRALAX  Take 17 g by mouth daily as needed for moderate constipation.   thiamine  100 MG tablet Commonly known  as: Vitamin B-1 Take 1 tablet (100 mg total) by mouth daily. Start taking on: June 22, 2024        Discharge Exam: Fredricka Weights   06/19/24 1922 06/20/24 0102  Weight: 61.2 kg 62.8 kg   ***  Condition at discharge: {DC Condition:26389}  The results of significant diagnostics from this hospitalization (including imaging, microbiology, ancillary and laboratory) are listed below for reference.   Imaging Studies: CT L-SPINE NO CHARGE Result Date: 06/20/2024 EXAM: CT OF THE LUMBAR SPINE WITHOUT CONTRAST  06/20/2024 12:08:41 AM TECHNIQUE: CT of the lumbar spine was performed without the administration of intravenous contrast. Multiplanar reformatted images are provided for review. Automated exposure control, iterative reconstruction, and/or weight based adjustment of the mA/kV was utilized to reduce the radiation dose to as low as reasonably achievable. COMPARISON: None available. CLINICAL HISTORY: FINDINGS: BONES AND ALIGNMENT: Normal vertebral body heights. No acute fracture or suspicious bone lesion. Normal alignment. DEGENERATIVE CHANGES: No significant degenerative changes. SOFT TISSUES: Evaluated on dedicated CT abdomen/pelvis. IMPRESSION: 1. Normal CT lumbar spine. Electronically signed by: Pinkie Pebbles MD 06/20/2024 12:17 AM EDT RP Workstation: [id number]   CT ABDOMEN PELVIS W CONTRAST Result Date: 06/19/2024 CLINICAL DATA:  Abdominal pain, acute, nonlocalized EXAM: CT ABDOMEN AND PELVIS WITH CONTRAST TECHNIQUE: Multidetector CT imaging of the abdomen and pelvis was performed using the standard protocol following bolus administration of intravenous contrast. RADIATION DOSE REDUCTION: This exam was performed according to the departmental dose-optimization program which includes automated exposure control, adjustment of the mA and/or kV according to patient size and/or use of iterative reconstruction technique. CONTRAST:  75mL OMNIPAQUE  IOHEXOL  350 MG/ML SOLN COMPARISON:  CT abdomen pelvis 03/01/2024 FINDINGS: Lower chest: No acute abnormality. Hepatobiliary: Focal hypodensity along falciform ligament likely focal fatty infiltration. No gallstones, gallbladder wall thickening, or pericholecystic fluid. No biliary dilatation. Pancreas: No focal lesion. Normal pancreatic contour. No surrounding inflammatory changes. No main pancreatic ductal dilatation. Spleen: Normal in size without focal abnormality. Adrenals/Urinary Tract: No adrenal nodule bilaterally. Bilateral kidneys enhance symmetrically. No  hydronephrosis. No hydroureter. The urinary bladder is unremarkable. Stomach/Bowel: Stomach is within normal limits. No evidence of bowel wall thickening or dilatation. The appendix is not definitely identified with no inflammatory changes in the right lower quadrant to suggest acute appendicitis. Vascular/Lymphatic: No abdominal aorta or iliac aneurysm. No abdominal, pelvic, or inguinal lymphadenopathy. Reproductive: Uterus and bilateral adnexa are unremarkable. Other: Nonspecific trace volume intraperitoneal free fluid. No intraperitoneal free gas. No organized fluid collection. Musculoskeletal: No abdominal wall hernia or abnormality. No suspicious lytic or blastic osseous lesions. No acute displaced fracture. Intramedullary nail fixation of the partially visualized proximal right femur. IMPRESSION: No acute intra-abdominal or intrapelvic abnormality. Electronically Signed   By: Morgane  Naveau M.D.   On: 06/19/2024 23:05   DG Chest 2 View Result Date: 06/19/2024 CLINICAL DATA:  Chest pain. EXAM: CHEST - 2 VIEW COMPARISON:  01/25/2023. FINDINGS: Normal heart, mediastinum and hila. Clear lungs.  No pleural effusion or pneumothorax. Skeletal structures are within normal limits. IMPRESSION: Normal chest radiographs. Electronically Signed   By: Alm Parkins M.D.   On: 06/19/2024 20:09    Microbiology: Results for orders placed or performed during the hospital encounter of 06/19/24  Blood culture (routine x 2)     Status: None (Preliminary result)   Collection Time: 06/19/24 10:03 PM   Specimen: BLOOD RIGHT ARM  Result Value Ref Range Status   Specimen Description BLOOD RIGHT ARM  Final   Special Requests   Final  BOTTLES DRAWN AEROBIC AND ANAEROBIC Blood Culture adequate volume   Culture   Final    NO GROWTH 2 DAYS Performed at [facility], [address] [place], [place] [address]    Report Status PENDING  Incomplete  Blood culture (routine x 2)     Status: None (Preliminary result)    Collection Time: 06/19/24 10:07 PM   Specimen: BLOOD  Result Value Ref Range Status   Specimen Description BLOOD RIGHT ANTECUBITAL  Final   Special Requests   Final    BOTTLES DRAWN AEROBIC AND ANAEROBIC Blood Culture adequate volume   Culture   Final    NO GROWTH 2 DAYS Performed at [facility], [address], [place], [place] [address]    Report Status PENDING  Incomplete  Resp panel by RT-PCR (RSV, Flu A&B, Covid) Anterior Nasal Swab     Status: None   Collection Time: 06/19/24 11:33 PM   Specimen: Anterior Nasal Swab  Result Value Ref Range Status   SARS Coronavirus 2 by RT PCR NEGATIVE NEGATIVE Final   Influenza A by PCR NEGATIVE NEGATIVE Final   Influenza B by PCR NEGATIVE NEGATIVE Final    Comment: (NOTE) The Xpert Xpress SARS-CoV-2/FLU/RSV plus assay is intended as an aid in the diagnosis of influenza from Nasopharyngeal swab specimens and should not be used as a sole basis for treatment. Nasal washings and aspirates are unacceptable for Xpert Xpress SARS-CoV-2/FLU/RSV testing.  Fact Sheet for Patients: BloggerCourse.com  Fact Sheet for Healthcare Providers: SeriousBroker.it  This test is not yet approved or cleared by the [place]  FDA and has been authorized for detection and/or diagnosis of SARS-CoV-2 by FDA under an Emergency Use Authorization (EUA). This EUA will remain in effect (meaning this test can be used) for the duration of the COVID-19 declaration under Section 564(b)(1) of the Act, 21 U.S.C. section 360bbb-3(b)(1), unless the authorization is terminated or revoked.     Resp Syncytial Virus by PCR NEGATIVE NEGATIVE Final    Comment: (NOTE) Fact Sheet for Patients: BloggerCourse.com  Fact Sheet for Healthcare Providers: SeriousBroker.it  This test is not yet approved or cleared by the [place]  FDA and has been authorized for detection  and/or diagnosis of SARS-CoV-2 by FDA under an Emergency Use Authorization (EUA). This EUA will remain in effect (meaning this test can be used) for the duration of the COVID-19 declaration under Section 564(b)(1) of the Act, 21 U.S.C. section 360bbb-3(b)(1), unless the authorization is terminated or revoked.  Performed at [facility], [address] [place], [place] [address]   Respiratory (~20 pathogens) panel by PCR     Status: Abnormal   Collection Time: 06/19/24 11:33 PM   Specimen: Anterior Nasal Swab; Respiratory  Result Value Ref Range Status   Adenovirus NOT DETECTED NOT DETECTED Final   Coronavirus 229E NOT DETECTED NOT DETECTED Final    Comment: (NOTE) The Coronavirus on the Respiratory Panel, DOES NOT test for the novel  Coronavirus (2019 nCoV)    Coronavirus HKU1 NOT DETECTED NOT DETECTED Final   Coronavirus NL63 NOT DETECTED NOT DETECTED Final   Coronavirus OC43 NOT DETECTED NOT DETECTED Final   Metapneumovirus NOT DETECTED NOT DETECTED Final   Rhinovirus / Enterovirus DETECTED (A) NOT DETECTED Final   Influenza A NOT DETECTED NOT DETECTED Final   Influenza B NOT DETECTED NOT DETECTED Final   Parainfluenza Virus 1 NOT DETECTED NOT DETECTED Final   Parainfluenza Virus 2 NOT DETECTED NOT DETECTED Final  Parainfluenza Virus 3 NOT DETECTED NOT DETECTED Final   Parainfluenza Virus 4 NOT DETECTED NOT DETECTED Final   Respiratory Syncytial Virus NOT DETECTED NOT DETECTED Final   Bordetella pertussis NOT DETECTED NOT DETECTED Final   Bordetella Parapertussis NOT DETECTED NOT DETECTED Final   Chlamydophila pneumoniae NOT DETECTED NOT DETECTED Final   Mycoplasma pneumoniae NOT DETECTED NOT DETECTED Final    Comment: Performed at [facility], [address] [place], [place] [address]  Urine Culture (for pregnant, neutropenic or urologic patients or patients with an indwelling urinary catheter)     Status: Abnormal   Collection Time: 06/19/24 11:53 PM    Specimen: Urine, Clean Catch  Result Value Ref Range Status   Specimen Description URINE, CLEAN CATCH  Final   Special Requests   Final    NONE Performed at [facility], [address] [place], [place] [address]    Culture (A)  Final    10,000 COLONIES/mL MULTIPLE SPECIES PRESENT, SUGGEST RECOLLECTION   Report Status 06/21/2024 FINAL  Final  MRSA Next Gen by PCR, Nasal     Status: None   Collection Time: 06/19/24 11:55 PM   Specimen: Nasal Mucosa; Nasal Swab  Result Value Ref Range Status   MRSA by PCR Next Gen NOT DETECTED NOT DETECTED Final    Comment: (NOTE) The GeneXpert MRSA Assay (FDA approved for NASAL specimens only), is one component of a comprehensive MRSA colonization surveillance program. It is not intended to diagnose MRSA infection nor to guide or monitor treatment for MRSA infections. Test performance is not FDA approved in patients less than 31 years old. Performed at [facility], [address] [place], [place] [address]     Labs: CBC: Recent Labs  Lab 06/19/24 1936 06/20/24 0344 06/21/24 0858  WBC 5.2 3.8* 3.3*  NEUTROABS  --  1.8  --   HGB 11.2* 8.9* 10.5*  HCT 33.8* 26.8* 31.3*  MCV 94.7 95.4 95.1  PLT 275 220 247   Basic Metabolic Panel: Recent Labs  Lab 06/19/24 1936 06/20/24 0341 06/20/24 0344 06/21/24 0858  NA 139  --  140 139  K 3.5  --  3.0* 3.0*  CL 102  --  108 103  CO2 22  --  22 25  GLUCOSE 93  --  92 98  BUN <5*  --  <5* <5*  CREATININE 0.73  --  0.63 0.65  CALCIUM 8.9  --  7.5* 9.1  MG  --  1.2*  --  1.7  PHOS  --  2.8 2.9  --    Liver Function Tests: Recent Labs  Lab 06/19/24 1936 06/20/24 0344  AST 66* 34  ALT 33 21  ALKPHOS 66 51  BILITOT 0.4 0.3  PROT 7.3 5.4*  ALBUMIN 4.0 2.9*   CBG: No results for input(s): GLUCAP in the last 168 hours.  Discharge time spent: {LESS THAN/GREATER UYJW:73611} 30 minutes.  Signed: Concepcion Riser, MD [facility] 06/21/2024

## 2024-06-21 NOTE — Plan of Care (Signed)
  Problem: Fluid Volume: Goal: Hemodynamic stability will improve Outcome: Progressing   Problem: Clinical Measurements: Goal: Signs and symptoms of infection will decrease Outcome: Progressing   Problem: Respiratory: Goal: Ability to maintain adequate ventilation will improve Outcome: Progressing   Problem: Clinical Measurements: Goal: Respiratory complications will improve Outcome: Progressing Goal: Cardiovascular complication will be avoided Outcome: Progressing   Problem: Elimination: Goal: Will not experience complications related to urinary retention Outcome: Progressing   Problem: Safety: Goal: Ability to remain free from injury will improve Outcome: Progressing

## 2024-06-21 NOTE — Progress Notes (Signed)
 Initial Nutrition Assessment  DOCUMENTATION CODES:   Not applicable  INTERVENTION:  -Continue regular menu, regular texture, thin liquids -Add Ensure Plus HP BID to promote adequate intake -Continue to monitor Phos, Mg, Potassium -Continue MVI, Thiamine , Folic acid  supplementation -Continue pt to focus on adequate kcal/pro intake   NUTRITION DIAGNOSIS:   Inadequate oral intake related to acute illness, social / environmental circumstances as evidenced by per patient/family report.   GOAL:   Patient will meet greater than or equal to 90% of their needs   MONITOR:   PO intake, Supplement acceptance, Weight trends, Labs, Skin  REASON FOR ASSESSMENT:   Consult Assessment of nutrition requirement/status  ASSESSMENT:   Hx alcohol  abuse, tobacco abuse, marijuana abuse. Admit with fever, malaise, diarrhea, back pain-dx ETOH withdrawal  Spoke to pt at bedside. Pt not feeling well, though, does endorse a little bit of an appetite today. No noted or stated v/c/d or chewing/swallowing difficulties. Pt does have two meals documented at 80% and 100% consumed yesterday. Per pt, she was not eating well prior to admission. Pt denies recent weight loss, UBW 130s, most recent weight 138 lbs. Attempted to conduct NFPE, pt requests at a later date since she is not feeling well. Will complete at f/u. Add Ensure Plus High Protein BID to promote adequate PO intake. Encouraged pt to focus on adequate kcal/pro intake. Pt verbalized understanding. Recommend continuing to monitor phos, mg, potassium for refeeding risk d/t polysubstance abuse/unknown PO intake prior to admit, hypokalemia. Pt denies additional questions/concerns at this time, will continue to monitor, RDN available prn.    Labs Potassium 3.0 BUN <5 Mg was 1.2, now 1.7 Albumin 2.9 H/H 10.5/31.3   Medications Folic Acid  MVI w/min Thiamine  Potassium Chloride  40 mEq BID x two days   NUTRITION - FOCUSED PHYSICAL EXAM:  Will  complete at f/u  Diet Order:   Diet Order             Diet regular Room service appropriate? Yes; Fluid consistency: Thin  Diet effective now                   EDUCATION NEEDS:   Education needs have been addressed  Skin:  Skin Assessment: Reviewed RN Assessment  Last BM:  7/20  Height:   Ht Readings from Last 1 Encounters:  06/20/24 5' 3 (1.6 m)    Weight:   Wt Readings from Last 1 Encounters:  06/20/24 62.8 kg    BMI:  Body mass index is 24.53 kg/m.  Estimated Nutritional Needs:   Kcal:  1600-1900 kcal  Protein:  75-95 g  Fluid:  >/=2L   Audrey Schultz, RDN, LDN Registered Dietitian Nutritionist RD Inpatient Contact Info in [place]

## 2024-06-21 NOTE — TOC CM/SW Note (Signed)
 Transition of Care [facility]) - Inpatient Brief Assessment   Patient Details  Name: Audrey Schultz MRN: [id number] Date of Birth: 06/03/1991  Transition of Care [facility]) CM/SW Contact:    Luise JAYSON Pan, LCSWA Phone Number: 06/21/2024, 10:48 AM   Clinical Narrative: Patient asleep upon entry. CSW left substance use resources at patients bedside.   No further action required from [facility] at this time. If new needs arise, please place a TOC consult.    Transition of Care Asessment: Insurance and Status: Insurance coverage has been reviewed Patient has primary care physician: Yes Home environment has been reviewed: Home with family Prior level of function:: independent Prior/Current Home Services: No current home services Social Drivers of Health Review: SDOH reviewed no interventions necessary Readmission risk has been reviewed: No Transition of care needs: no transition of care needs at this time

## 2024-06-21 NOTE — Plan of Care (Signed)
 Patient discharging home today.

## 2024-06-21 NOTE — TOC CM/SW Note (Signed)
 Transition of Care [facility]) - Inpatient Brief Assessment   Patient Details  Name: Audrey Schultz MRN: [id number] Date of Birth: 10/09/1991  Transition of Care [facility]) CM/SW Contact:    Waddell Barnie Rama, RN Phone Number: 06/21/2024, 3:17 PM   Clinical Narrative: From home with grandma and daughter, has PCP and insurance on file, states has no HH services in place at this time or DME at home.  States her Dad will transport them home at [employer] and family is support system, states gets medications from [facility] [place].  Pta self ambulatory.   There are no TOC needs identified  at this time.  Please place consult for TOC needs.     Transition of Care Asessment: Insurance and Status: Insurance coverage has been reviewed Patient has primary care physician: Yes Home environment has been reviewed: home with grandma and her daughter Prior level of function:: indep Prior/Current Home Services: No current home services Social Drivers of Health Review: SDOH reviewed no interventions necessary Readmission risk has been reviewed: Yes Transition of care needs: no transition of care needs at this time

## 2024-06-21 NOTE — Evaluation (Signed)
 Occupational Therapy Evaluation Patient Details Name: Audrey Schultz MRN: [id number] DOB: 03/24/1991 Today's Date: 06/21/2024   History of Present Illness   Pt is a 33 yr old female who presented 06/19/24 due to weakness, diarrhea, back pain and body aches. Pt found to have alcohol  withdrawal, SIRS and polysubstance abuse. PMH: alcohol  abuse, tobacco abuse, marijuana abuse, IM nailing RLE     Clinical Impressions Pt reported at PLOF they completed limited ambulation with no DME and lives in a house with 4 steps to enter. At this time pt was supervision to [facility] in the beginning of session but after completion of sponge bath at sink with supervision to CGA then needed hand held assist to ambulate back to bed. At this time do not anticipate follow up therapy but will continue to follow with Acute Occupational Therapy.      If plan is discharge home, recommend the following:   A little help with walking and/or transfers;A little help with bathing/dressing/bathroom;Assist for transportation;Help with stairs or ramp for entrance     Functional Status Assessment   Patient has had a recent decline in their functional status and demonstrates the ability to make significant improvements in function in a reasonable and predictable amount of time.     Equipment Recommendations   Tub/shower seat     Recommendations for Other Services         Precautions/Restrictions   Precautions Precautions: Fall Recall of Precautions/Restrictions: Intact Precaution/Restrictions Comments: CIWA Restrictions Weight Bearing Restrictions Per Provider Order: No     Mobility Bed Mobility Overal bed mobility: Needs Assistance Bed Mobility: Supine to Sit, Sit to Supine     Supine to sit: Modified independent (Device/Increase time) Sit to supine: Modified independent (Device/Increase time)        Transfers Overall transfer level: Needs assistance Equipment used: None, 1 person hand held  assist Transfers: Sit to/from Stand Sit to Stand: Supervision, Contact guard assist                  Balance Overall balance assessment: Needs assistance Sitting-balance support: Feet supported Sitting balance-Leahy Scale: Good     Standing balance support: No upper extremity supported, Single extremity supported Standing balance-Leahy Scale: Fair Standing balance comment: depending on back pain and shaking                           ADL either performed or assessed with clinical judgement   ADL Overall ADL's : Needs assistance/impaired Eating/Feeding: Independent   Grooming: Wash/dry hands;Wash/dry face;Oral care;Applying deodorant;Contact guard assist;Sitting;Standing   Upper Body Bathing: Set up;Sitting   Lower Body Bathing: Contact guard assist;Sit to/from stand   Upper Body Dressing : Set up;Sitting   Lower Body Dressing: Contact guard assist;Sit to/from stand   Toilet Transfer: Contact guard assist;Ambulation   Toileting- Clothing Manipulation and Hygiene: Contact guard assist;Sit to/from stand       Functional mobility during ADLs: Contact guard assist;Minimal assistance General ADL Comments: pt intially did not need assist but after full clean up at sink mostly in standing noted to increase in shaking and then needed increase in hand held support     Vision Baseline Vision/History: 0 No visual deficits Ability to See in Adequate Light: 0 Adequate Patient Visual Report: No change from baseline Vision Assessment?: No apparent visual deficits     Perception Perception: Within Functional Limits       Praxis Praxis: [facility]  Pertinent Vitals/Pain Pain Assessment Pain Assessment: Faces Faces Pain Scale: Hurts little more Breathing: normal Negative Vocalization: none Facial Expression: smiling or inexpressive Body Language: tense, distressed pacing, fidgeting Consolability: no need to console PAINAD Score: 1 Pain Location: back Pain  Descriptors / Indicators: Aching Pain Intervention(s): Limited activity within patient's tolerance, Monitored during session, Repositioned     Extremity/Trunk Assessment Upper Extremity Assessment Upper Extremity Assessment: Generalized weakness (shaking most likey due to withdrawl)   Lower Extremity Assessment Lower Extremity Assessment: Defer to PT evaluation   Cervical / Trunk Assessment Cervical / Trunk Assessment: Kyphotic   Communication Communication Communication: No apparent difficulties   Cognition Arousal: Alert Behavior During Therapy: Flat affect Cognition: No apparent impairments                               Following commands: Intact       Cueing  General Comments   Cueing Techniques: Verbal cues      Exercises     Shoulder Instructions      Home Living Family/patient expects to be discharged to:: Private residence Living Arrangements: Other relatives Available Help at Discharge: Family Type of Home: House Home Access: Stairs to enter Secretary/administrator of Steps: 4 Entrance Stairs-Rails: Right;Left;Can reach both Home Layout: One level     Bathroom Shower/Tub: Tub only;Curtain   Bathroom Toilet: Standard Bathroom Accessibility: Yes   Home Equipment: None          Prior Functioning/Environment Prior Level of Function : Independent/Modified Independent             Mobility Comments: per pt they do not get out the house often ADLs Comments: indep    OT Problem List: Decreased strength;Decreased activity tolerance;Impaired balance (sitting and/or standing);Decreased safety awareness;Decreased knowledge of use of DME or AE;Pain   OT Treatment/Interventions: Self-care/ADL training;Therapeutic exercise;DME and/or AE instruction;Therapeutic activities;Balance training;Patient/family education      OT Goals(Current goals can be found in the care plan section)   Acute Rehab OT Goals Patient Stated Goal: to get  better OT Goal Formulation: With patient Time For Goal Achievement: 07/05/24 Potential to Achieve Goals: Good ADL Goals Pt Will Perform Lower Body Bathing: with modified independence Pt Will Perform Lower Body Dressing: with modified independence Pt Will Perform Toileting - Clothing Manipulation and hygiene: with modified independence Additional ADL Goal #1: Pt will be able to complete ambulation without assist and no LOB   OT Frequency:  Min 2X/week    Co-evaluation              AM-PAC OT 6 Clicks Daily Activity     Outcome Measure Help from another person eating meals?: None Help from another person taking care of personal grooming?: None Help from another person toileting, which includes using toliet, bedpan, or urinal?: A Little Help from another person bathing (including washing, rinsing, drying)?: A Little Help from another person to put on and taking off regular upper body clothing?: None Help from another person to put on and taking off regular lower body clothing?: A Little 6 Click Score: 21   End of Session Equipment Utilized During Treatment: Gait belt Nurse Communication: Mobility status  Activity Tolerance: Patient tolerated treatment well Patient left: in bed;with call bell/phone within reach  OT Visit Diagnosis: Unsteadiness on feet (R26.81);Other abnormalities of gait and mobility (R26.89);Muscle weakness (generalized) (M62.81);Pain Pain - Right/Left:  (back)  Time: 0727-0802 OT Time Calculation (min): 35 min Charges:  OT General Charges $OT Visit: 1 Visit OT Evaluation $OT Eval Low Complexity: 1 Low OT Treatments $Self Care/Home Management : 8-22 mins  Warrick POUR OTR/L  Acute Rehab Services  [phone] office number   Warrick Berber 06/21/2024, 8:12 AM

## 2024-06-22 ENCOUNTER — Telehealth: Payer: Self-pay

## 2024-06-22 NOTE — Transitions of Care (Post Inpatient/ED Visit) (Unsigned)
   06/22/2024  Name: Audrey Schultz MRN: [id number] DOB: 06-Aug-1991  Today's TOC FU Call Status: Today's TOC FU Call Status:: Unsuccessful Call (1st Attempt) Unsuccessful Call (1st Attempt) Date: 06/22/24  Attempted to reach the patient regarding the most recent Inpatient/ED visit.  Follow Up Plan: Additional outreach attempts will be made to reach the patient to complete the Transitions of Care (Post Inpatient/ED visit) call.   Signature Julian Lemmings, LPN [facility] Nurse Health Advisor Direct Dial [phone]

## 2024-06-23 ENCOUNTER — Ambulatory Visit

## 2024-06-23 NOTE — Transitions of Care (Post Inpatient/ED Visit) (Unsigned)
   06/23/2024  Name: Audrey Schultz MRN: [id number] DOB: 03-08-1991  Today's TOC FU Call Status: Today's TOC FU Call Status:: Unsuccessful Call (2nd Attempt) Unsuccessful Call (1st Attempt) Date: 06/22/24 Unsuccessful Call (2nd Attempt) Date: 06/23/24  Attempted to reach the patient regarding the most recent Inpatient/ED visit.  Follow Up Plan: Additional outreach attempts will be made to reach the patient to complete the Transitions of Care (Post Inpatient/ED visit) call.   Signature Julian Lemmings, LPN [facility] Nurse Health Advisor Direct Dial [phone]

## 2024-06-24 ENCOUNTER — Inpatient Hospital Stay: Admitting: Internal Medicine

## 2024-06-24 LAB — CULTURE, BLOOD (ROUTINE X 2)
Culture: NO GROWTH
Culture: NO GROWTH
Special Requests: ADEQUATE
Special Requests: ADEQUATE

## 2024-06-24 NOTE — Transitions of Care (Post Inpatient/ED Visit) (Signed)
   06/24/2024  Name: Audrey Schultz MRN: [id number] DOB: 01/06/1991  Today's TOC FU Call Status: Today's TOC FU Call Status:: Unsuccessful Call (3rd Attempt) Unsuccessful Call (1st Attempt) Date: 06/22/24 Unsuccessful Call (2nd Attempt) Date: 06/23/24 Unsuccessful Call (3rd Attempt) Date: 06/24/24  Attempted to reach the patient regarding the most recent Inpatient/ED visit.  Follow Up Plan: No further outreach attempts will be made at this time. We have been unable to contact the patient.  Signature Julian Lemmings, LPN [facility] Nurse Health Advisor Direct Dial [phone]

## 2024-07-05 ENCOUNTER — Telehealth: Payer: Self-pay | Admitting: Internal Medicine

## 2024-07-05 ENCOUNTER — Inpatient Hospital Stay: Admitting: Internal Medicine

## 2024-07-05 NOTE — Telephone Encounter (Signed)
 Provider aware

## 2024-07-05 NOTE — Telephone Encounter (Signed)
 12/10/2023 no show 07/05/2024 no show  Final warning sent via mail and mychart

## 2024-08-19 ENCOUNTER — Other Ambulatory Visit (HOSPITAL_COMMUNITY)
Admission: RE | Admit: 2024-08-19 | Discharge: 2024-08-19 | Disposition: A | Discharge status: Home / Self Care | Source: Ambulatory Visit | Attending: Internal Medicine | Admitting: Internal Medicine

## 2024-08-19 DIAGNOSIS — Z113 Encounter for screening for infections with a predominantly sexual mode of transmission: Secondary | ICD-10-CM | POA: Diagnosis present
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
## 2024-08-20 ENCOUNTER — Ambulatory Visit (INDEPENDENT_AMBULATORY_CARE_PROVIDER_SITE_OTHER): Admitting: Internal Medicine

## 2024-08-20 ENCOUNTER — Encounter: Payer: Self-pay | Admitting: Internal Medicine

## 2024-08-20 ENCOUNTER — Telehealth: Payer: Self-pay

## 2024-08-20 VITALS — BP 126/88 | HR 110 | Temp 98.0°F | Ht 63.0 in | Wt 140.0 lb

## 2024-08-20 DIAGNOSIS — Z113 Encounter for screening for infections with a predominantly sexual mode of transmission: Secondary | ICD-10-CM | POA: Diagnosis not present

## 2024-08-20 DIAGNOSIS — Z3201 Encounter for pregnancy test, result positive: Secondary | ICD-10-CM

## 2024-08-20 DIAGNOSIS — L02419 Cutaneous abscess of limb, unspecified: Secondary | ICD-10-CM | POA: Diagnosis not present

## 2024-08-20 DIAGNOSIS — E559 Vitamin D deficiency, unspecified: Secondary | ICD-10-CM | POA: Diagnosis not present

## 2024-08-20 LAB — POCT URINE PREGNANCY: Preg Test, Ur: POSITIVE — AB

## 2024-08-20 LAB — HCG, QUANTITATIVE, PREGNANCY: Quantitative HCG: <0.6 m[IU]/mL

## 2024-08-20 LAB — VITAMIN D 25 HYDROXY (VIT D DEFICIENCY, FRACTURES): VITD: 20.13 ng/mL — ABNORMAL LOW (ref 30.00–100.00)

## 2024-08-20 NOTE — Telephone Encounter (Signed)
 Copied from CRM #[id number]. Topic: Clinical - Lab/Test Results >> Aug 20, 2024  4:06 PM [place] E wrote: Reason for CRM: Patient calling in regarding her lab results that were done today. Called CAL to get confirmation as PCP did not view them yet, CAL stated agent could relay that patient is pregnant. Patient did not have any further questions.

## 2024-08-20 NOTE — Telephone Encounter (Signed)
 Called patient and informed her that due to her urine HCG test in office having a faint positive line. Rosina ordered blood labs to confirm pregnancy and possible EDD. I also informed her that the labs have not come back yet and once they do Rosina will review and someone will be in contact with her. She thanked me for calling and all (if any) questions were answered.

## 2024-08-23 ENCOUNTER — Ambulatory Visit: Payer: Self-pay | Admitting: Internal Medicine

## 2024-08-23 ENCOUNTER — Other Ambulatory Visit: Payer: Self-pay | Admitting: Internal Medicine

## 2024-08-23 DIAGNOSIS — N760 Acute vaginitis: Secondary | ICD-10-CM

## 2024-08-23 DIAGNOSIS — Z3201 Encounter for pregnancy test, result positive: Secondary | ICD-10-CM

## 2024-08-23 LAB — CERVICOVAGINAL ANCILLARY ONLY
Bacterial Vaginitis (gardnerella): POSITIVE — AB
Candida Glabrata: NEGATIVE
Candida Vaginitis: NEGATIVE
Chlamydia: NEGATIVE
Comment: NEGATIVE
Comment: NEGATIVE
Comment: NEGATIVE
Comment: NEGATIVE
Comment: NEGATIVE
Comment: NORMAL
Neisseria Gonorrhea: NEGATIVE
Trichomonas: NEGATIVE

## 2024-08-23 NOTE — Telephone Encounter (Signed)
 Pt stated she has a OBGYN appointment on 9/23. She would follow up after tha appointment.  Avelina Finder, CMA

## 2024-08-24 ENCOUNTER — Ambulatory Visit

## 2024-08-24 DIAGNOSIS — Z3042 Encounter for surveillance of injectable contraceptive: Secondary | ICD-10-CM

## 2024-08-24 DIAGNOSIS — Z3202 Encounter for pregnancy test, result negative: Secondary | ICD-10-CM

## 2024-08-24 LAB — POCT URINE PREGNANCY: Preg Test, Ur: NEGATIVE

## 2024-08-24 NOTE — Progress Notes (Signed)
 Pt presents for depo restart. She reports that she went to her PCP on 08/20/24 and received a positive UPT. She also had a HCG drawn, which was negative. Pt last had unprotected intercourse 08/14/24-08/15/24. She reports that she woke up spotting on 08/18/24, which lasted for ~ 3 days. RN consulted with Dr. Eveline. Due to the negative UPT in office today, we will repeat the UPT in 2 weeks for the restart of the depo.  Pt also c/o of worsening period cramps and is requesting something stronger than ibuprofen . RN advised that she would need to schedule a provider visit to further discuss medication management of pain. If pt is able to schedule a provider visit in 2 weeks, UPT and depo can be done at that visit.

## 2024-08-25 MED ORDER — METRONIDAZOLE 500 MG PO TABS: 500.0000 mg | ORAL_TABLET | Freq: Two times a day (BID) | ORAL | 0 refills | Status: AC

## 2024-08-25 NOTE — Addendum Note (Signed)
 Addended by: Joella Saefong on: 08/25/2024 04:28 PM   Modules accepted: Orders

## 2024-08-28 ENCOUNTER — Encounter (HOSPITAL_COMMUNITY): Payer: Self-pay

## 2024-08-28 ENCOUNTER — Emergency Department (HOSPITAL_COMMUNITY)
Admission: EM | Admit: 2024-08-28 | Discharge: 2024-08-28 | Disposition: A | Discharge status: Home / Self Care | Attending: Emergency Medicine | Admitting: Emergency Medicine

## 2024-08-28 ENCOUNTER — Other Ambulatory Visit: Payer: Self-pay

## 2024-08-28 DIAGNOSIS — F10129 Alcohol abuse with intoxication, unspecified: Secondary | ICD-10-CM | POA: Diagnosis present

## 2024-08-28 DIAGNOSIS — F10920 Alcohol use, unspecified with intoxication, uncomplicated: Secondary | ICD-10-CM

## 2024-08-28 DIAGNOSIS — Y908 Blood alcohol level of 240 mg/100 ml or more: Secondary | ICD-10-CM | POA: Insufficient documentation

## 2024-08-28 LAB — BASIC METABOLIC PANEL WITH GFR
Anion gap: 14 (ref 5–15)
BUN: <5 mg/dL — ABNORMAL LOW (ref 6–20)
CO2: 21 mmol/L — ABNORMAL LOW (ref 22–32)
Calcium: 8.2 mg/dL — ABNORMAL LOW (ref 8.9–10.3)
Chloride: 111 mmol/L (ref 98–111)
Creatinine, Ser: 0.64 mg/dL (ref 0.44–1.00)
GFR, Estimated: >60 mL/min (ref >60)
Glucose, Bld: 102 mg/dL — ABNORMAL HIGH (ref 70–99)
Potassium: 3.5 mmol/L (ref 3.5–5.1)
Sodium: 145 mmol/L (ref 135–145)

## 2024-08-28 LAB — CBC WITH DIFFERENTIAL/PLATELET
Abs Immature Granulocytes: 0.01 K/uL (ref 0.00–0.07)
Basophils Absolute: 0 K/uL (ref 0.0–0.1)
Basophils Relative: 0 %
Eosinophils Absolute: 0 K/uL (ref 0.0–0.5)
Eosinophils Relative: 0 %
HCT: 34.7 % — ABNORMAL LOW (ref 36.0–46.0)
Hemoglobin: 10.4 g/dL — ABNORMAL LOW (ref 12.0–15.0)
Immature Granulocytes: 0 %
Lymphocytes Relative: 46 %
Lymphs Abs: 2.4 K/uL (ref 0.7–4.0)
MCH: 31.3 pg (ref 26.0–34.0)
MCHC: 30 g/dL (ref 30.0–36.0)
MCV: 104.5 fL — ABNORMAL HIGH (ref 80.0–100.0)
Monocytes Absolute: 0.3 K/uL (ref 0.1–1.0)
Monocytes Relative: 5 %
Neutro Abs: 2.5 K/uL (ref 1.7–7.7)
Neutrophils Relative %: 49 %
Platelets: 341 K/uL (ref 150–400)
RBC: 3.32 MIL/uL — ABNORMAL LOW (ref 3.87–5.11)
RDW: 15.8 % — ABNORMAL HIGH (ref 11.5–15.5)
WBC: 5.2 K/uL (ref 4.0–10.5)
nRBC: 0 % (ref 0.0–0.2)

## 2024-08-28 LAB — ETHANOL: Alcohol, Ethyl (B): 347 mg/dL (ref <15)

## 2024-08-28 LAB — HCG, SERUM, QUALITATIVE: Preg, Serum: NEGATIVE

## 2024-08-28 MED ORDER — HALOPERIDOL LACTATE 5 MG/ML IJ SOLN: 5.0000 mg | Freq: Once | INTRAMUSCULAR | Status: DC

## 2024-08-28 MED ORDER — SODIUM CHLORIDE 0.9 % IV BOLUS
1000.0000 mL | Freq: Once | INTRAVENOUS | Status: AC
  Administered 2024-08-28: 1000 mL via INTRAVENOUS

## 2024-08-28 NOTE — ED Notes (Signed)
 Pt called to Clinical research associate as she was passing the door. She wanted her IV out NOW upon entering the room pt was rude and demanding IV be taken out. She is insisting on leaving. Dr Laurice aware, upon him entering room she was rude, mouthy and demanding. He could not reason with pt. She is ambulatory and can walk without assistance. She had IV removed and left department to wait for ride.

## 2024-08-28 NOTE — Discharge Instructions (Signed)
 Return for any problem.  ?

## 2024-08-28 NOTE — ED Provider Notes (Signed)
 [place] EMERGENCY DEPARTMENT AT [facility] Provider Note   CSN: [id number] Arrival date & time: 08/28/24  9089     Patient presents with: Alcohol  Intoxication   Audrey Schultz is a 33 y.o. female.   33 year old female with prior medical history as detailed below presents for evaluation.  Patient arrives from [facility].  Patient reportedly had significant number of vodka shots earlier this morning and yesterday evening.  She appears to be clinically intoxicated.  Further history is not able to be obtained from the patient as she appears to be quite intoxicated.  The history is provided by the patient and medical records.       Prior to Admission medications   Medication Sig Start Date End Date Taking? Authorizing Provider  cyclobenzaprine  (FLEXERIL ) 5 MG tablet Take 1 tablet (5 mg total) by mouth at bedtime. 06/21/24   Darci Pore, MD  docusate sodium  (COLACE) 100 MG capsule Take 1 capsule (100 mg total) by mouth 2 (two) times daily. Patient not taking: Reported on 06/20/2024 03/10/24   Billy Knee, FNP  folic acid  (FOLVITE ) 1 MG tablet Take 1 tablet (1 mg total) by mouth daily. Patient not taking: Reported on 08/20/2024 06/22/24   Darci Pore, MD  ibuprofen  (ADVIL ) 800 MG tablet Take 1 tablet (800 mg total) by mouth 3 (three) times daily with meals as needed for headache, moderate pain (pain score 4-6) or cramping. Patient not taking: Reported on 08/20/2024 06/21/24   Darci Pore, MD  Iron , Ferrous Sulfate , 325 (65 Fe) MG TABS Take 325 mg by mouth daily. Patient not taking: Reported on 08/20/2024 06/21/24   Darci Pore, MD  medroxyPROGESTERone  (DEPO-PROVERA ) 150 MG/ML injection Inject 1 mL (150 mg total) into the muscle every 3 (three) months. 06/13/24   Eveline Lynwood MATSU, MD  metroNIDAZOLE  (FLAGYL ) 500 MG tablet Take 1 tablet (500 mg total) by mouth 2 (two) times daily for 7 days. 08/25/24 09/01/24  Billy Knee, FNP  Multiple Vitamin  (MULTIVITAMIN WITH MINERALS) TABS tablet Take 1 tablet by mouth daily. Patient not taking: Reported on 08/20/2024 06/22/24   Darci Pore, MD  nicotine  (NICODERM CQ  - DOSED IN MG/24 HOURS) 21 mg/24hr patch Place 1 patch (21 mg total) onto the skin daily. 06/22/24   Darci Pore, MD  ondansetron  (ZOFRAN -ODT) 4 MG disintegrating tablet Take 1 tablet (4 mg total) by mouth every 8 (eight) hours as needed for nausea or vomiting. 06/21/24   Darci Pore, MD  polyethylene glycol powder (GLYCOLAX /MIRALAX ) 17 GM/SCOOP powder Take 17 g by mouth daily as needed for moderate constipation. Patient not taking: Reported on 06/20/2024 03/10/24   Billy Knee, FNP  thiamine  (VITAMIN B-1) 100 MG tablet Take 1 tablet (100 mg total) by mouth daily. Patient not taking: Reported on 08/20/2024 06/22/24   Darci Pore, MD    Allergies: Shellfish allergy    Review of Systems  Unable to perform ROS: Acuity of condition    Updated Vital Signs BP 113/80 (BP Location: Right Arm)   Pulse 91   Temp 97.8 F (36.6 C) (Axillary)   Resp 16   Ht 5' 3 (1.6 m)   Wt 63.5 kg   SpO2 100%   BMI 24.80 kg/m   Physical Exam Vitals and nursing note reviewed.  Constitutional:      General: She is not in acute distress.    Appearance: Normal appearance. She is well-developed.     Comments: Appears intoxicated, unable to provide history  HENT:     Head:  Normocephalic and atraumatic.  Eyes:     Conjunctiva/sclera: Conjunctivae normal.     Pupils: Pupils are equal, round, and reactive to light.  Cardiovascular:     Rate and Rhythm: Normal rate and regular rhythm.     Heart sounds: Normal heart sounds.  Pulmonary:     Effort: Pulmonary effort is normal. No respiratory distress.     Breath sounds: Normal breath sounds.  Abdominal:     General: There is no distension.     Palpations: Abdomen is soft.     Tenderness: There is no abdominal tenderness.  Musculoskeletal:        General: No  deformity. Normal range of motion.     Cervical back: Normal range of motion and neck supple.  Skin:    General: Skin is warm and dry.  Neurological:     General: No focal deficit present.     Mental Status: She is oriented to person, place, and time.     (all labs ordered are listed, but only abnormal results are displayed) Labs Reviewed  CBC WITH DIFFERENTIAL/PLATELET - Abnormal; Notable for the following components:      Result Value   RBC 3.32 (*)    Hemoglobin 10.4 (*)    HCT 34.7 (*)    MCV 104.5 (*)    RDW 15.8 (*)    All other components within normal limits  BASIC METABOLIC PANEL WITH GFR  HCG, SERUM, QUALITATIVE  ETHANOL  URINE DRUG SCREEN  URINALYSIS, W/ REFLEX TO CULTURE (INFECTION SUSPECTED)    EKG: None  Radiology: No results found.   Procedures   Medications Ordered in the ED  sodium chloride  0.9 % bolus 1,000 mL (1,000 mLs Intravenous New Bag/Given 08/28/24 0943)                                    Medical Decision Making Patient arrived initially quite intoxicated from [facility].  Shortly after arrival the patient was able to get up and ambulate without difficulty.  At this point in time she decided that she wanted to be discharged.  She appears to be clinically improved.  She is able to ambulate in a straight line.    She denies any other medical complaint.  She is quite Holiday representative.  She is encouraged to behave appropriately in this public setting otherwise she can be escorted from the property by security.  Amount and/or Complexity of Data Reviewed Labs: ordered.        Final diagnoses:  Alcoholic intoxication without complication    ED Discharge Orders     None          Laurice Maude BROCKS, MD 08/28/24 1052

## 2024-08-28 NOTE — ED Notes (Signed)
 Pt went to the bathroom and was unable to catch urine

## 2024-08-28 NOTE — ED Triage Notes (Signed)
 Pt bib EMS from [facility] said they had to sternal rub at [facility]. Vodka shots early this morning and yesterday evening. 20 LAC 500 mL NS. 6/10 pain BP 112/70 HR 88 O2 100% RA CBG 176 RR 16

## 2024-08-28 NOTE — ED Notes (Signed)
 Pt is currently not able to provide urine at this time

## 2024-09-07 ENCOUNTER — Ambulatory Visit

## 2024-09-07 ENCOUNTER — Ambulatory Visit: Admitting: Obstetrics

## 2024-09-07 VITALS — BP 135/88 | HR 104 | Ht 63.5 in | Wt 143.7 lb

## 2024-09-07 NOTE — Progress Notes (Signed)
 Pt presents for depo shot.  Appointment cancelled.

## 2024-09-08 ENCOUNTER — Ambulatory Visit: Admitting: Physician Assistant

## 2024-09-15 NOTE — Progress Notes (Signed)
 Appointment cancelled

## 2024-09-18 ENCOUNTER — Encounter (HOSPITAL_COMMUNITY): Payer: Self-pay

## 2024-09-18 ENCOUNTER — Inpatient Hospital Stay (HOSPITAL_COMMUNITY)
Admission: EM | Admit: 2024-09-18 | Discharge: 2024-09-19 | DRG: 440 | Discharge status: Against Medical Advice | Attending: Student | Admitting: Student

## 2024-09-18 ENCOUNTER — Other Ambulatory Visit: Payer: Self-pay

## 2024-09-18 DIAGNOSIS — F17210 Nicotine dependence, cigarettes, uncomplicated: Secondary | ICD-10-CM | POA: Diagnosis present

## 2024-09-18 DIAGNOSIS — K852 Alcohol induced acute pancreatitis without necrosis or infection: Principal | ICD-10-CM

## 2024-09-18 DIAGNOSIS — Z720 Tobacco use: Secondary | ICD-10-CM

## 2024-09-18 DIAGNOSIS — R1013 Epigastric pain: Secondary | ICD-10-CM | POA: Diagnosis not present

## 2024-09-18 DIAGNOSIS — Z8249 Family history of ischemic heart disease and other diseases of the circulatory system: Secondary | ICD-10-CM | POA: Diagnosis not present

## 2024-09-18 DIAGNOSIS — Z79899 Other long term (current) drug therapy: Secondary | ICD-10-CM | POA: Diagnosis not present

## 2024-09-18 DIAGNOSIS — Z91013 Allergy to seafood: Secondary | ICD-10-CM

## 2024-09-18 DIAGNOSIS — F32A Depression, unspecified: Secondary | ICD-10-CM | POA: Diagnosis present

## 2024-09-18 DIAGNOSIS — E876 Hypokalemia: Secondary | ICD-10-CM

## 2024-09-18 DIAGNOSIS — Z793 Long term (current) use of hormonal contraceptives: Secondary | ICD-10-CM

## 2024-09-18 DIAGNOSIS — D513 Other dietary vitamin B12 deficiency anemia: Secondary | ICD-10-CM | POA: Diagnosis present

## 2024-09-18 DIAGNOSIS — F1010 Alcohol abuse, uncomplicated: Secondary | ICD-10-CM

## 2024-09-18 DIAGNOSIS — K859 Acute pancreatitis without necrosis or infection, unspecified: Principal | ICD-10-CM | POA: Diagnosis present

## 2024-09-18 DIAGNOSIS — R112 Nausea with vomiting, unspecified: Secondary | ICD-10-CM | POA: Diagnosis not present

## 2024-09-18 HISTORY — DX: Alcohol induced acute pancreatitis without necrosis or infection: K85.20

## 2024-09-18 LAB — URINALYSIS, ROUTINE W REFLEX MICROSCOPIC
Bacteria, UA: NONE SEEN
Bilirubin Urine: NEGATIVE
Glucose, UA: NEGATIVE mg/dL
Ketones, ur: NEGATIVE mg/dL
Nitrite: NEGATIVE
Protein, ur: 30 mg/dL — AB
Specific Gravity, Urine: 1.027 (ref 1.005–1.030)
pH: 5 (ref 5.0–8.0)

## 2024-09-18 LAB — LIPASE, BLOOD: Lipase: 621 U/L — ABNORMAL HIGH (ref 11–51)

## 2024-09-18 LAB — COMPREHENSIVE METABOLIC PANEL WITH GFR
ALT: 44 U/L (ref 0–44)
AST: 51 U/L — ABNORMAL HIGH (ref 15–41)
Albumin: 4.9 g/dL (ref 3.5–5.0)
Alkaline Phosphatase: 105 U/L (ref 38–126)
Anion gap: 15 (ref 5–15)
BUN: <5 mg/dL — ABNORMAL LOW (ref 6–20)
CO2: 21 mmol/L — ABNORMAL LOW (ref 22–32)
Calcium: 9.6 mg/dL (ref 8.9–10.3)
Chloride: 106 mmol/L (ref 98–111)
Creatinine, Ser: 0.53 mg/dL (ref 0.44–1.00)
GFR, Estimated: >60 mL/min (ref >60)
Glucose, Bld: 126 mg/dL — ABNORMAL HIGH (ref 70–99)
Potassium: 3.2 mmol/L — ABNORMAL LOW (ref 3.5–5.1)
Sodium: 141 mmol/L (ref 135–145)
Total Bilirubin: 0.7 mg/dL (ref 0.0–1.2)
Total Protein: 7.8 g/dL (ref 6.5–8.1)

## 2024-09-18 LAB — HCG, SERUM, QUALITATIVE: Preg, Serum: NEGATIVE

## 2024-09-18 LAB — CBC
HCT: 36.9 % (ref 36.0–46.0)
Hemoglobin: 12 g/dL (ref 12.0–15.0)
MCH: 32.9 pg (ref 26.0–34.0)
MCHC: 32.5 g/dL (ref 30.0–36.0)
MCV: 101.1 fL — ABNORMAL HIGH (ref 80.0–100.0)
Platelets: 317 K/uL (ref 150–400)
RBC: 3.65 MIL/uL — ABNORMAL LOW (ref 3.87–5.11)
RDW: 15.5 % (ref 11.5–15.5)
WBC: 7.9 K/uL (ref 4.0–10.5)
nRBC: 0 % (ref 0.0–0.2)

## 2024-09-18 MED ORDER — THIAMINE MONONITRATE 100 MG PO TABS
100.0000 mg | ORAL_TABLET | Freq: Every day | ORAL | Status: DC
  Administered 2024-09-18 – 2024-09-19 (×2): 100 mg via ORAL
  Filled 2024-09-18 (×2): qty 1

## 2024-09-18 MED ORDER — ONDANSETRON HCL 4 MG/2ML IJ SOLN
4.0000 mg | Freq: Four times a day (QID) | INTRAMUSCULAR | Status: DC | PRN
  Administered 2024-09-19 (×2): 4 mg via INTRAVENOUS
  Filled 2024-09-18 (×2): qty 2

## 2024-09-18 MED ORDER — ACETAMINOPHEN 650 MG RE SUPP: 650.0000 mg | Freq: Four times a day (QID) | RECTAL | Status: DC | PRN

## 2024-09-18 MED ORDER — NALOXONE HCL 0.4 MG/ML IJ SOLN: 0.4000 mg | INTRAMUSCULAR | Status: DC | PRN

## 2024-09-18 MED ORDER — FOLIC ACID 1 MG PO TABS
1.0000 mg | ORAL_TABLET | Freq: Every day | ORAL | Status: DC
  Administered 2024-09-19: 1 mg via ORAL
  Filled 2024-09-18: qty 1

## 2024-09-18 MED ORDER — HYDROMORPHONE HCL 1 MG/ML IJ SOLN
0.5000 mg | Freq: Once | INTRAMUSCULAR | Status: AC
  Administered 2024-09-18: 0.5 mg via INTRAVENOUS
  Filled 2024-09-18: qty 1

## 2024-09-18 MED ORDER — SODIUM CHLORIDE 0.9 % IV BOLUS
1000.0000 mL | Freq: Once | INTRAVENOUS | Status: AC
  Administered 2024-09-18: 1000 mL via INTRAVENOUS

## 2024-09-18 MED ORDER — ONDANSETRON HCL 4 MG/2ML IJ SOLN
4.0000 mg | Freq: Once | INTRAMUSCULAR | Status: AC
  Administered 2024-09-18: 4 mg via INTRAVENOUS
  Filled 2024-09-18: qty 2

## 2024-09-18 MED ORDER — MELATONIN 3 MG PO TABS: 3.0000 mg | ORAL_TABLET | Freq: Every evening | ORAL | Status: DC | PRN

## 2024-09-18 MED ORDER — THIAMINE HCL 100 MG/ML IJ SOLN: 100.0000 mg | Freq: Every day | INTRAMUSCULAR | Status: DC

## 2024-09-18 MED ORDER — ACETAMINOPHEN 325 MG PO TABS: 650.0000 mg | ORAL_TABLET | Freq: Four times a day (QID) | ORAL | Status: DC | PRN

## 2024-09-18 MED ORDER — LORAZEPAM 1 MG PO TABS
1.0000 mg | ORAL_TABLET | ORAL | Status: DC | PRN
  Administered 2024-09-19: 2 mg via ORAL
  Administered 2024-09-19: 1 mg via ORAL
  Filled 2024-09-18: qty 1
  Filled 2024-09-18: qty 2

## 2024-09-18 MED ORDER — ADULT MULTIVITAMIN W/MINERALS CH
1.0000 | ORAL_TABLET | Freq: Every day | ORAL | Status: DC
  Administered 2024-09-19: 1 via ORAL
  Filled 2024-09-18: qty 1

## 2024-09-18 MED ORDER — HYDROMORPHONE HCL 1 MG/ML IJ SOLN
1.0000 mg | Freq: Once | INTRAMUSCULAR | Status: AC
  Administered 2024-09-18: 1 mg via INTRAVENOUS
  Filled 2024-09-18: qty 1

## 2024-09-18 MED ORDER — POTASSIUM CHLORIDE 20 MEQ PO PACK
20.0000 meq | PACK | Freq: Every day | ORAL | Status: DC
  Administered 2024-09-18: 20 meq via ORAL
  Filled 2024-09-18: qty 1

## 2024-09-18 MED ORDER — HYDROMORPHONE HCL 1 MG/ML IJ SOLN
0.5000 mg | INTRAMUSCULAR | Status: DC | PRN
  Administered 2024-09-18 – 2024-09-19 (×6): 0.5 mg via INTRAVENOUS
  Filled 2024-09-18: qty 0.5
  Filled 2024-09-18: qty 1
  Filled 2024-09-18 (×5): qty 0.5

## 2024-09-18 MED ORDER — LORAZEPAM 2 MG/ML IJ SOLN: 1.0000 mg | INTRAMUSCULAR | Status: DC | PRN

## 2024-09-18 NOTE — H&P (Signed)
 History and Physical      Audrey Schultz FMW:[id number] DOB: Apr 30, 1991 DOA: 09/18/2024; DOS: 09/18/2024  PCP: Billy Knee, FNP *** Patient coming from: home ***  I have personally briefly reviewed patient's old medical records in [facility]  Chief Complaint: ***  HPI: Audrey Schultz is a 33 y.o. female with medical history significant for *** who is admitted to [facility] on 09/18/2024 with *** after presenting from home*** to [facility] ED complaining of ***.   ***        ***  ED Course:  Vital signs in the ED were notable for the following: ***  Labs were notable for the following: ***  Per my interpretation, EKG in ED demonstrated the following:  ***  Imaging in the ED, per corresponding formal radiology read, was notable for the following: ***  While in the ED, the following were administered: ***  Subsequently, the patient was admitted  ***  ***red   Review of Systems: As per HPI otherwise 10 point review of systems negative.   Past Medical History:  Diagnosis Date   Anemia    hx w/pregnancy   Dysmenorrhea    Medical history non-contributory    Nicotine  dependence 01/10/2020   Vitamin D  deficiency 01/10/2020    Past Surgical History:  Procedure Laterality Date   INTRAMEDULLARY (IM) NAIL INTERTROCHANTERIC Right 01/07/2020   Procedure: INTRAMEDULLARY (IM) NAIL INTERTROCHANTRIC;  Surgeon: Celena Sharper, MD;  Location: [facility] OR;  Service: Orthopedics;  Laterality: Right;   ORIF ANKLE FRACTURE Left 01/20/2020   Procedure: OPEN REDUCTION INTERNAL FIXATION (ORIF) ANKLE FRACTURE;  Surgeon: Celena Sharper, MD;  Location: [facility] OR;  Service: Orthopedics;  Laterality: Left;    Social History:  reports that she has been smoking cigarettes. She has a 2.8 pack-year smoking history. She has never used smokeless tobacco. She reports current alcohol  use of about 4.0 - 5.0 standard drinks of alcohol  per week. She reports current drug use. Drug:  Marijuana.   Allergies  Allergen Reactions   Shellfish Allergy Anaphylaxis    Family History  Problem Relation Age of Onset   Hypertension Mother    Diabetes Mother     Family history reviewed and not pertinent ***   Prior to Admission medications   Medication Sig Start Date End Date Taking? Authorizing Provider  cyclobenzaprine  (FLEXERIL ) 5 MG tablet Take 1 tablet (5 mg total) by mouth at bedtime. Patient not taking: Reported on 09/07/2024 06/21/24   Darci Pore, MD  docusate sodium  (COLACE) 100 MG capsule Take 1 capsule (100 mg total) by mouth 2 (two) times daily. Patient not taking: Reported on 09/07/2024 03/10/24   Billy Knee, FNP  folic acid  (FOLVITE ) 1 MG tablet Take 1 tablet (1 mg total) by mouth daily. 06/22/24   Darci Pore, MD  ibuprofen  (ADVIL ) 800 MG tablet Take 1 tablet (800 mg total) by mouth 3 (three) times daily with meals as needed for headache, moderate pain (pain score 4-6) or cramping. Patient not taking: Reported on 08/20/2024 06/21/24   Darci Pore, MD  Iron , Ferrous Sulfate , 325 (65 Fe) MG TABS Take 325 mg by mouth daily. Patient not taking: Reported on 09/07/2024 06/21/24   Darci Pore, MD  medroxyPROGESTERone  (DEPO-PROVERA ) 150 MG/ML injection Inject 1 mL (150 mg total) into the muscle every 3 (three) months. 06/13/24   Arnold, James G, MD  Multiple Vitamin (MULTIVITAMIN WITH MINERALS) TABS tablet Take 1 tablet by mouth daily. 06/22/24   Darci Pore, MD  nicotine  (NICODERM CQ  -  DOSED IN MG/24 HOURS) 21 mg/24hr patch Place 1 patch (21 mg total) onto the skin daily. 06/22/24   Darci Pore, MD  ondansetron  (ZOFRAN -ODT) 4 MG disintegrating tablet Take 1 tablet (4 mg total) by mouth every 8 (eight) hours as needed for nausea or vomiting. Patient not taking: Reported on 09/07/2024 06/21/24   Darci Pore, MD  polyethylene glycol powder (GLYCOLAX /MIRALAX ) 17 GM/SCOOP powder Take 17 g by mouth daily as needed  for moderate constipation. Patient not taking: Reported on 09/07/2024 03/10/24   Billy Knee, FNP  thiamine  (VITAMIN B-1) 100 MG tablet Take 1 tablet (100 mg total) by mouth daily. 06/22/24   Darci Pore, MD     Objective    Physical Exam: Vitals:   09/18/24 1011 09/18/24 1359 09/18/24 1749 09/18/24 2030  BP: (!) 158/113 (!) 152/99 (!) 146/104 (!) 151/110  Pulse: 98 96 95 97  Resp: 18 18 18    Temp: 99.9 F (37.7 C) 99.4 F (37.4 C) 98.5 F (36.9 C)   TempSrc: Oral Oral    SpO2: 100% 100% 99% 100%    General: appears to be stated age; alert, oriented Skin: warm, dry, no rash Head:  AT/[place] Mouth:  Oral mucosa membranes appear moist, normal dentition Neck: supple; trachea midline Heart:  RRR; did not appreciate any M/R/G Lungs: CTAB, did not appreciate any wheezes, rales, or rhonchi Abdomen: + BS; soft, ND, NT Vascular: 2+ pedal pulses b/l; 2+ radial pulses b/l Extremities: no peripheral edema, no muscle wasting   *** Neuro: strength and sensation intact in upper and lower extremities b/l  *** Neuro: 5/5 strength of the proximal and distal flexors and extensors of the upper and lower extremities bilaterally; sensation intact in upper and lower extremities b/l; cranial nerves II through XII grossly intact; no pronator drift; no evidence suggestive of slurred speech, dysarthria, or facial droop; Normal muscle tone. No tremors. *** Neuro: In the setting of the patient's current mental status and associated inability to follow instructions, unable to perform full neurologic exam at this time.  As such, assessment of strength, sensation, and cranial nerves is limited at this time. Patient noted to spontaneously move all 4 extremities. No tremors.  ***       Labs on Admission: I have personally reviewed following labs and imaging studies  CBC: Recent Labs  Lab 09/18/24 1142  WBC 7.9  HGB 12.0  HCT 36.9  MCV 101.1*  PLT 317   Basic Metabolic Panel: Recent  Labs  Lab 09/18/24 1142  NA 141  K 3.2*  CL 106  CO2 21*  GLUCOSE 126*  BUN <5*  CREATININE 0.53  CALCIUM 9.6   GFR: Estimated Creatinine Clearance: 93.6 mL/min (by C-G formula based on SCr of 0.53 mg/dL). Liver Function Tests: Recent Labs  Lab 09/18/24 1142  AST 51*  ALT 44  ALKPHOS 105  BILITOT 0.7  PROT 7.8  ALBUMIN 4.9   Recent Labs  Lab 09/18/24 1142  LIPASE 621*   No results for input(s): AMMONIA in the last 168 hours. Coagulation Profile: No results for input(s): INR, PROTIME in the last 168 hours. Cardiac Enzymes: No results for input(s): CKTOTAL, CKMB, CKMBINDEX, TROPONINI in the last 168 hours. BNP (last 3 results) No results for input(s): PROBNP in the last 8760 hours. HbA1C: No results for input(s): HGBA1C in the last 72 hours. CBG: No results for input(s): GLUCAP in the last 168 hours. Lipid Profile: No results for input(s): CHOL, HDL, LDLCALC, TRIG, CHOLHDL, LDLDIRECT in the last 72  hours. Thyroid Function Tests: No results for input(s): TSH, T4TOTAL, FREET4, T3FREE, THYROIDAB in the last 72 hours. Anemia Panel: No results for input(s): VITAMINB12, FOLATE, FERRITIN, TIBC, IRON , RETICCTPCT in the last 72 hours. Urine analysis:    Component Value Date/Time   COLORURINE YELLOW 09/18/2024 1142   APPEARANCEUR TURBID (A) 09/18/2024 1142   LABSPEC 1.027 09/18/2024 1142   PHURINE 5.0 09/18/2024 1142   GLUCOSEU NEGATIVE 09/18/2024 1142   HGBUR MODERATE (A) 09/18/2024 1142   BILIRUBINUR NEGATIVE 09/18/2024 1142   BILIRUBINUR negative 03/10/2024 1606   KETONESUR NEGATIVE 09/18/2024 1142   PROTEINUR 30 (A) 09/18/2024 1142   UROBILINOGEN 0.2 03/10/2024 1606   UROBILINOGEN 0.2 11/16/2014 0607   NITRITE NEGATIVE 09/18/2024 1142   LEUKOCYTESUR TRACE (A) 09/18/2024 1142    Radiological Exams on Admission: No results found.    Assessment/Plan    Principal Problem:   Acute  pancreatitis  ***        ***                  ***                  ***                    ***                    ***                   ***                    ***                    ***                    ***                    ***                    ***                    ***                   ***     DVT prophylaxis: SCD's ***  Code Status: Full code*** Family Communication: none*** Disposition Plan: Per Rounding Team Consults called: none***;  Admission status: ***    I SPENT GREATER THAN 75 *** MINUTES IN CLINICAL CARE TIME/MEDICAL DECISION-MAKING IN COMPLETING THIS ADMISSION.     Eva NOVAK Adra Shepler DO [facility] From 7PM - 7AM   09/18/2024, 9:06 PM   ***

## 2024-09-18 NOTE — ED Triage Notes (Signed)
 Patient in today reporting ongoing abd pain that started last night with nausea. Hx of same.

## 2024-09-18 NOTE — ED Provider Notes (Signed)
 [place] EMERGENCY DEPARTMENT AT [facility] Provider Note   CSN: [id number] Arrival date & time: 09/18/24  1001     Patient presents with: Abdominal Pain   Audrey Schultz is a 33 y.o. female.   Patient complains of nausea and abdominal pain.  Patient admits to recent large amount of alcohol  use.  Patient states that she has been vomiting and has not been able to keep any fluids down.  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  Patient reports she has not had any alcohol  in 2 days.  Patient reports she has not been able to eat or drink.  Patient reports the pain is the same as previous episodes of pancreatitis.  The history is provided by the patient and a friend. No language interpreter was used.  Abdominal Pain Pain location:  Generalized Pain quality: aching   Pain severity:  Severe Onset quality:  Gradual Timing:  Constant Progression:  Worsening Chronicity:  New Context: alcohol  use   Relieved by:  Nothing Worsened by:  Nothing Ineffective treatments:  None tried Associated symptoms: anorexia and vomiting        Prior to Admission medications   Medication Sig Start Date End Date Taking? Authorizing Provider  cyclobenzaprine  (FLEXERIL ) 5 MG tablet Take 1 tablet (5 mg total) by mouth at bedtime. Patient not taking: Reported on 09/07/2024 06/21/24   Darci Pore, MD  docusate sodium  (COLACE) 100 MG capsule Take 1 capsule (100 mg total) by mouth 2 (two) times daily. Patient not taking: Reported on 09/07/2024 03/10/24   Billy Knee, FNP  folic acid  (FOLVITE ) 1 MG tablet Take 1 tablet (1 mg total) by mouth daily. 06/22/24   Darci Pore, MD  ibuprofen  (ADVIL ) 800 MG tablet Take 1 tablet (800 mg total) by mouth 3 (three) times daily with meals as needed for headache, moderate pain (pain score 4-6) or cramping. Patient not taking: Reported on 08/20/2024 06/21/24   Darci Pore, MD  Iron , Ferrous  Sulfate, 325 (65 Fe) MG TABS Take 325 mg by mouth daily. Patient not taking: Reported on 09/07/2024 06/21/24   Darci Pore, MD  medroxyPROGESTERone  (DEPO-PROVERA ) 150 MG/ML injection Inject 1 mL (150 mg total) into the muscle every 3 (three) months. 06/13/24   Arnold, James G, MD  Multiple Vitamin (MULTIVITAMIN WITH MINERALS) TABS tablet Take 1 tablet by mouth daily. 06/22/24   Darci Pore, MD  nicotine  (NICODERM CQ  - DOSED IN MG/24 HOURS) 21 mg/24hr patch Place 1 patch (21 mg total) onto the skin daily. 06/22/24   Darci Pore, MD  ondansetron  (ZOFRAN -ODT) 4 MG disintegrating tablet Take 1 tablet (4 mg total) by mouth every 8 (eight) hours as needed for nausea or vomiting. Patient not taking: Reported on 09/07/2024 06/21/24   Darci Pore, MD  polyethylene glycol powder (GLYCOLAX /MIRALAX ) 17 GM/SCOOP powder Take 17 g by mouth daily as needed for moderate constipation. Patient not taking: Reported on 09/07/2024 03/10/24   Billy Knee, FNP  thiamine  (VITAMIN B-1) 100 MG tablet Take 1 tablet (100 mg total) by mouth daily. 06/22/24   Darci Pore, MD    Allergies: Shellfish allergy    Review of Systems  Gastrointestinal:  Positive for abdominal pain, anorexia and vomiting.  All other systems reviewed and are negative.   Updated Vital Signs BP (!) 146/104   Pulse 95   Temp 98.5 F (36.9 C)   Resp 18   LMP  (LMP Unknown)   SpO2 99%   Physical  Exam Vitals and nursing note reviewed.  Constitutional:      Appearance: She is well-developed.  HENT:     Head: Normocephalic.  Eyes:     Extraocular Movements: Extraocular movements intact.  Cardiovascular:     Rate and Rhythm: Normal rate.  Pulmonary:     Effort: Pulmonary effort is normal.  Abdominal:     General: Abdomen is flat. Bowel sounds are normal. There is no distension.     Palpations: Abdomen is soft.     Tenderness: There is generalized abdominal tenderness.  Musculoskeletal:         General: Normal range of motion.     Cervical back: Normal range of motion.  Skin:    General: Skin is warm.  Neurological:     General: No focal deficit present.     Mental Status: She is alert and oriented to person, place, and time.     (all labs ordered are listed, but only abnormal results are displayed) Labs Reviewed  LIPASE, BLOOD - Abnormal; Notable for the following components:      Result Value   Lipase 621 (*)    All other components within normal limits  COMPREHENSIVE METABOLIC PANEL WITH GFR - Abnormal; Notable for the following components:   Potassium 3.2 (*)    CO2 21 (*)    Glucose, Bld 126 (*)    BUN <5 (*)    AST 51 (*)    All other components within normal limits  CBC - Abnormal; Notable for the following components:   RBC 3.65 (*)    MCV 101.1 (*)    All other components within normal limits  URINALYSIS, ROUTINE W REFLEX MICROSCOPIC - Abnormal; Notable for the following components:   APPearance TURBID (*)    Hgb urine dipstick MODERATE (*)    Protein, ur 30 (*)    Leukocytes,Ua TRACE (*)    All other components within normal limits  HCG, SERUM, QUALITATIVE    EKG: None  Radiology: No results found.   Procedures   Medications Ordered in the ED  potassium chloride  (KLOR-CON ) packet 20 mEq (20 mEq Oral Given 09/18/24 1814)  sodium chloride  0.9 % bolus 1,000 mL (0 mLs Intravenous Stopped 09/18/24 1814)  ondansetron  (ZOFRAN ) injection 4 mg (4 mg Intravenous Given 09/18/24 1521)  HYDROmorphone  (DILAUDID ) injection 0.5 mg (0.5 mg Intravenous Given 09/18/24 1520)  HYDROmorphone  (DILAUDID ) injection 0.5 mg (0.5 mg Intravenous Given 09/18/24 1735)                                    Medical Decision Making Patient complains of diffuse abdominal pain.  Patient has had similar pain with pancreatitis in the past.  Amount and/or Complexity of Data Reviewed Labs: ordered. Decision-making details documented in ED Course.    Details: Labs ordered  reviewed and interpreted.  Lipase is 621.  Potassium is 3.2. Discussion of management or test interpretation with external provider(s): I spoke to Dr. Cathern Hospitalist who will see for admission   Risk Prescription drug management. Risk Details: Patient given IV fluids x 1 L.  Patient is given Dilaudid  and Zofran .  Patient reports some initial relief but reports pain has returned.  Patient is given a second dosage of Dilaudid .  Patient reports some improvement but pain has returned.  Patient does not feel like she can take oral medications.  Patient is given third doses of pain medication I will  consult hospitalist for admission.        Final diagnoses:  Acute pancreatitis, unspecified complication status, unspecified pancreatitis type    ED Discharge Orders     None          Flint Sonny MARLA DEVONNA 09/18/24 2057    Francesca Elsie CROME, MD 09/18/24 2156

## 2024-09-19 ENCOUNTER — Other Ambulatory Visit: Payer: Self-pay

## 2024-09-19 ENCOUNTER — Encounter (HOSPITAL_COMMUNITY): Payer: Self-pay | Admitting: Internal Medicine

## 2024-09-19 DIAGNOSIS — K852 Alcohol induced acute pancreatitis without necrosis or infection: Secondary | ICD-10-CM | POA: Diagnosis not present

## 2024-09-19 DIAGNOSIS — R112 Nausea with vomiting, unspecified: Secondary | ICD-10-CM | POA: Diagnosis present

## 2024-09-19 DIAGNOSIS — F1010 Alcohol abuse, uncomplicated: Secondary | ICD-10-CM | POA: Diagnosis not present

## 2024-09-19 DIAGNOSIS — E876 Hypokalemia: Secondary | ICD-10-CM | POA: Diagnosis not present

## 2024-09-19 DIAGNOSIS — R1013 Epigastric pain: Secondary | ICD-10-CM | POA: Diagnosis present

## 2024-09-19 LAB — CBC WITH DIFFERENTIAL/PLATELET
Abs Immature Granulocytes: 0.02 K/uL (ref 0.00–0.07)
Basophils Absolute: 0 K/uL (ref 0.0–0.1)
Basophils Relative: 0 %
Eosinophils Absolute: 0 K/uL (ref 0.0–0.5)
Eosinophils Relative: 0 %
HCT: 32.2 % — ABNORMAL LOW (ref 36.0–46.0)
Hemoglobin: 10.4 g/dL — ABNORMAL LOW (ref 12.0–15.0)
Immature Granulocytes: 0 %
Lymphocytes Relative: 24 %
Lymphs Abs: 1.7 K/uL (ref 0.7–4.0)
MCH: 32.5 pg (ref 26.0–34.0)
MCHC: 32.3 g/dL (ref 30.0–36.0)
MCV: 100.6 fL — ABNORMAL HIGH (ref 80.0–100.0)
Monocytes Absolute: 0.3 K/uL (ref 0.1–1.0)
Monocytes Relative: 4 %
Neutro Abs: 5.1 K/uL (ref 1.7–7.7)
Neutrophils Relative %: 72 %
Platelets: 318 K/uL (ref 150–400)
RBC: 3.2 MIL/uL — ABNORMAL LOW (ref 3.87–5.11)
RDW: 14.7 % (ref 11.5–15.5)
WBC: 7.1 K/uL (ref 4.0–10.5)
nRBC: 0 % (ref 0.0–0.2)

## 2024-09-19 LAB — PHOSPHORUS: Phosphorus: 2.6 mg/dL (ref 2.5–4.6)

## 2024-09-19 LAB — URINE DRUG SCREEN
Amphetamines: NEGATIVE
Barbiturates: NEGATIVE
Benzodiazepines: NEGATIVE
Cocaine: NEGATIVE
Fentanyl: NEGATIVE
Methadone Scn, Ur: NEGATIVE
Opiates: NEGATIVE
Tetrahydrocannabinol: POSITIVE — AB

## 2024-09-19 LAB — COMPREHENSIVE METABOLIC PANEL WITH GFR
ALT: 26 U/L (ref 0–44)
AST: 26 U/L (ref 15–41)
Albumin: 4.1 g/dL (ref 3.5–5.0)
Alkaline Phosphatase: 88 U/L (ref 38–126)
Anion gap: 13 (ref 5–15)
BUN: <5 mg/dL — ABNORMAL LOW (ref 6–20)
CO2: 21 mmol/L — ABNORMAL LOW (ref 22–32)
Calcium: 8.7 mg/dL — ABNORMAL LOW (ref 8.9–10.3)
Chloride: 101 mmol/L (ref 98–111)
Creatinine, Ser: 0.46 mg/dL (ref 0.44–1.00)
GFR, Estimated: >60 mL/min (ref >60)
Glucose, Bld: 102 mg/dL — ABNORMAL HIGH (ref 70–99)
Potassium: 2.7 mmol/L — CL (ref 3.5–5.1)
Sodium: 134 mmol/L — ABNORMAL LOW (ref 135–145)
Total Bilirubin: 0.7 mg/dL (ref 0.0–1.2)
Total Protein: 6.5 g/dL (ref 6.5–8.1)

## 2024-09-19 LAB — BASIC METABOLIC PANEL WITH GFR
Anion gap: 9 (ref 5–15)
BUN: <5 mg/dL — ABNORMAL LOW (ref 6–20)
CO2: 24 mmol/L (ref 22–32)
Calcium: 9.2 mg/dL (ref 8.9–10.3)
Chloride: 101 mmol/L (ref 98–111)
Creatinine, Ser: 0.53 mg/dL (ref 0.44–1.00)
GFR, Estimated: >60 mL/min (ref >60)
Glucose, Bld: 101 mg/dL — ABNORMAL HIGH (ref 70–99)
Potassium: 3.7 mmol/L (ref 3.5–5.1)
Sodium: 134 mmol/L — ABNORMAL LOW (ref 135–145)

## 2024-09-19 LAB — MAGNESIUM
Magnesium: 2 mg/dL (ref 1.7–2.4)
Magnesium: 2 mg/dL (ref 1.7–2.4)

## 2024-09-19 MED ORDER — NICOTINE 14 MG/24HR TD PT24: 14.0000 mg | MEDICATED_PATCH | Freq: Every day | TRANSDERMAL | Status: DC | PRN

## 2024-09-19 MED ORDER — LACTATED RINGERS IV SOLN
INTRAVENOUS | Status: DC
  Administered 2024-09-19: 150 mL/h via INTRAVENOUS

## 2024-09-19 MED ORDER — NICOTINE POLACRILEX 2 MG MT GUM: 2.0000 mg | CHEWING_GUM | OROMUCOSAL | Status: DC | PRN

## 2024-09-19 MED ORDER — POTASSIUM CHLORIDE 10 MEQ/100ML IV SOLN: 10.0000 meq | INTRAVENOUS | Status: DC

## 2024-09-19 MED ORDER — POTASSIUM CHLORIDE 10 MEQ/100ML IV SOLN
10.0000 meq | INTRAVENOUS | Status: AC
  Administered 2024-09-19: 10 meq via INTRAVENOUS
  Filled 2024-09-19: qty 100

## 2024-09-19 MED ORDER — POTASSIUM CHLORIDE 20 MEQ PO PACK
40.0000 meq | PACK | ORAL | Status: AC
  Administered 2024-09-19 (×3): 40 meq via ORAL
  Filled 2024-09-19 (×3): qty 2

## 2024-09-19 MED ORDER — CYANOCOBALAMIN 1000 MCG/ML IJ SOLN
1000.0000 ug | Freq: Once | INTRAMUSCULAR | Status: DC
  Filled 2024-09-19: qty 1

## 2024-09-19 MED ORDER — POTASSIUM CHLORIDE 10 MEQ/100ML IV SOLN
10.0000 meq | INTRAVENOUS | Status: DC
Start: 2024-09-19 — End: 2024-09-19

## 2024-09-19 MED ORDER — POTASSIUM CHLORIDE 10 MEQ/100ML IV SOLN
INTRAVENOUS | Status: AC
  Administered 2024-09-19: 10 meq via INTRAVENOUS
  Filled 2024-09-19: qty 100

## 2024-09-19 NOTE — ED Notes (Signed)
 Pt resting comfortably in bed; no signs of distress; reports pain is unchanged since last pain med dose.

## 2024-09-19 NOTE — Progress Notes (Signed)
 Date and time results received: 09/19/24 0810 (use smartphrase .now to insert current time)  Test: potassium  Critical Value: 2.7  Name of Provider Notified: Dr. Alban Pepper   Orders Received? Or Actions Taken?: new orders received

## 2024-09-19 NOTE — Plan of Care (Signed)
   Problem: Education: Goal: Knowledge of General Education information will improve Description: Including pain rating scale, medication(s)/side effects and non-pharmacologic comfort measures Outcome: Progressing   Problem: Health Behavior/Discharge Planning: Goal: Ability to manage health-related needs will improve Outcome: Progressing   Problem: Clinical Measurements: Goal: Ability to maintain clinical measurements within normal limits will improve Outcome: Progressing Goal: Will remain free from infection Outcome: Progressing Goal: Diagnostic test results will improve Outcome: Progressing Goal: Respiratory complications will improve Outcome: Progressing Goal: Cardiovascular complication will be avoided Outcome: Progressing   Problem: Nutrition: Goal: Adequate nutrition will be maintained Outcome: Progressing   Problem: Activity: Goal: Risk for activity intolerance will decrease Outcome: Progressing   Problem: Elimination: Goal: Will not experience complications related to bowel motility Outcome: Progressing Goal: Will not experience complications related to urinary retention Outcome: Progressing

## 2024-09-19 NOTE — Progress Notes (Signed)
 Progress Note   Patient: Audrey Schultz FMW:[id number] DOB: Apr 14, 1991 DOA: 09/18/2024     1 DOS: the patient was seen and examined on 09/19/2024   Brief hospital course: H&P HPI   HPI: Audrey Schultz is a 33 y.o. female with medical history significant for acute alcoholic pancreatitis, chronic alcohol  abuse, who is admitted to [facility] on 09/18/2024 with acute alcoholic pancreatitis after presenting from home to [facility] ED complaining of abdominal pain.    The patient reports 2 days of sharp epigastric discomfort, worse with palpation and radiating into the left upper abdominal quadrant.  This has been associated with recurrent nausea resulting in at least 3-4 episodes of nonbloody, nonbilious emesis over that timeframe.  Denies any recent melena or hematochezia.  Denies any recent subjective fever, chills, rigors, generalized myalgias.  No recent chest pain, shortness of breath, palpitations, diaphoresis.  No recent dysuria or gross hematuria.  In the setting of recurrent nausea/vomiting over the last 2 days, she reports very limited oral intake over that timeframe.   She has a history of chronic alcohol  abuse, and notes that most recent alcohol  consumption occurred 2 days ago, with patient not drinking over the last 2 days due to the progressive nature of her epigastric discomfort associated with recurrent nausea/vomiting, as above.  She was hospitalized in March 2025 for acute alcoholic pancreatitis.  Additionally, she was hospitalized in both June and July 2025 for alcohol  withdrawal.   Denies any recent procedures or any recent use of antibiotics.  Assessment and Plan: Acute Alcoholic pancreatitis Alcohol  use disorder  Patient reports last drink of alcohol  about 2 days ago.  She states that she drinks because she is depressed.  She stopped seeing her therapist March of this year.  Plan:  Continue IV fluids Pain control CIWA CLD Start naltrexone and medication for  depression Send referral for psychologists and CBT  Hypokalemia Hypomagnesemia  Replace. In the setting of alcohol  use and emesis.   Tobacco use disorder Nicotine  patch    Macrocytic anemia     Latest Ref Rng & Units 09/19/2024    6:33 AM 09/18/2024   11:42 AM 08/28/2024    9:38 AM  CBC  WBC 4.0 - 10.5 K/uL 7.1  7.9  5.2   Hemoglobin 12.0 - 15.0 g/dL 89.5  87.9  89.5   Hematocrit 36.0 - 46.0 % 32.2  36.9  34.7   Platelets 150 - 400 K/uL 318  317  341    Noted to having significantly low b12.  Will replace       Subjective: Continues to have some nause and abdminal pain. Able to take PO potassium. Thrown up a couple of times. No diarrhea.   Physical Exam: Vitals:   09/19/24 0306 09/19/24 0307 09/19/24 0356 09/19/24 0629  BP: (!) 166/116   124/87  Pulse: 98   98  Resp:    16  Temp:    99.1 F (37.3 C)  TempSrc:    Oral  SpO2:    98%  Weight:   66.1 kg   Height:  5' 3 (1.6 m)     Physical Exam  Constitutional: In mild discomfort.  Cardiovascular: Normal rate, regular rhythm. No lower extremity edema  Pulmonary: Non labored breathing on room air, no wheezing or rales.   Abdominal: Soft. Non distended and non tender Musculoskeletal: Normal range of motion.     Neurological: Alert and oriented to person, place, and time. Non focal  Skin: Skin is  warm and dry.   Data Reviewed:     Latest Ref Rng & Units 09/19/2024    6:33 AM 09/18/2024   11:42 AM 08/28/2024    9:38 AM  BMP  Glucose 70 - 99 mg/dL 897  873  897   BUN 6 - 20 mg/dL <5  <5  <5   Creatinine 0.44 - 1.00 mg/dL 9.53  9.46  9.35   Sodium 135 - 145 mmol/L 134  141  145   Potassium 3.5 - 5.1 mmol/L 2.7  3.2  3.5   Chloride 98 - 111 mmol/L 101  106  111   CO2 22 - 32 mmol/L 21  21  21    Calcium 8.9 - 10.3 mg/dL 8.7  9.6  8.2       Latest Ref Rng & Units 09/19/2024    6:33 AM 09/18/2024   11:42 AM 08/28/2024    9:38 AM  CBC  WBC 4.0 - 10.5 K/uL 7.1  7.9  5.2   Hemoglobin 12.0 - 15.0 g/dL 89.5   87.9  89.5   Hematocrit 36.0 - 46.0 % 32.2  36.9  34.7   Platelets 150 - 400 K/uL 318  317  341      Family Communication: None at bedside  Disposition: Status is: Inpatient Remains inpatient appropriate because: Electrolyte derangements, management of acute pancreatitis  Planned Discharge Destination: Home    Time spent: 35 minutes  Author: Alban Pepper, MD 09/19/2024 9:50 AM  For on call review [email or web address].

## 2024-09-19 NOTE — Progress Notes (Signed)
 Patient is requesting to speak to provider. She states, I  feel better and I am ready to go home.  Writer explained to patient that her potassium level was dangerously low this morning and it had to be replaced , She was advised to  should stay until her labs are redrawn  and the doctor feels it safe for her to discharge. Patient states she has some potassium pills at and she needs to see doctor. Dr. Franchot notified.

## 2024-09-19 NOTE — Progress Notes (Signed)
 After speaking to Dr. Franchot patient stills decides she wants to leave against medical advice. Patient informed of the dangers of leaving against medical advice. AMA paperwork signed.

## 2024-09-20 ENCOUNTER — Emergency Department (HOSPITAL_COMMUNITY)

## 2024-09-20 ENCOUNTER — Emergency Department (HOSPITAL_COMMUNITY)
Admission: EM | Admit: 2024-09-20 | Discharge: 2024-09-20 | Disposition: A | Discharge status: Home / Self Care | Attending: Emergency Medicine | Admitting: Emergency Medicine

## 2024-09-20 ENCOUNTER — Other Ambulatory Visit: Payer: Self-pay

## 2024-09-20 DIAGNOSIS — K852 Alcohol induced acute pancreatitis without necrosis or infection: Secondary | ICD-10-CM | POA: Insufficient documentation

## 2024-09-20 DIAGNOSIS — R109 Unspecified abdominal pain: Secondary | ICD-10-CM | POA: Diagnosis present

## 2024-09-20 LAB — CBC WITH DIFFERENTIAL/PLATELET
Abs Immature Granulocytes: 0.03 K/uL (ref 0.00–0.07)
Basophils Absolute: 0 K/uL (ref 0.0–0.1)
Basophils Relative: 0 %
Eosinophils Absolute: 0 K/uL (ref 0.0–0.5)
Eosinophils Relative: 0 %
HCT: 34.4 % — ABNORMAL LOW (ref 36.0–46.0)
Hemoglobin: 11.2 g/dL — ABNORMAL LOW (ref 12.0–15.0)
Immature Granulocytes: 0 %
Lymphocytes Relative: 15 %
Lymphs Abs: 1.2 K/uL (ref 0.7–4.0)
MCH: 32.8 pg (ref 26.0–34.0)
MCHC: 32.6 g/dL (ref 30.0–36.0)
MCV: 100.9 fL — ABNORMAL HIGH (ref 80.0–100.0)
Monocytes Absolute: 0.3 K/uL (ref 0.1–1.0)
Monocytes Relative: 3 %
Neutro Abs: 6.6 K/uL (ref 1.7–7.7)
Neutrophils Relative %: 82 %
Platelets: 275 K/uL (ref 150–400)
RBC: 3.41 MIL/uL — ABNORMAL LOW (ref 3.87–5.11)
RDW: 14.8 % (ref 11.5–15.5)
WBC: 8.1 K/uL (ref 4.0–10.5)
nRBC: 0 % (ref 0.0–0.2)

## 2024-09-20 LAB — COMPREHENSIVE METABOLIC PANEL WITH GFR
ALT: 19 U/L (ref 0–44)
AST: 19 U/L (ref 15–41)
Albumin: 3.9 g/dL (ref 3.5–5.0)
Alkaline Phosphatase: 73 U/L (ref 38–126)
Anion gap: 11 (ref 5–15)
BUN: <5 mg/dL — ABNORMAL LOW (ref 6–20)
CO2: 22 mmol/L (ref 22–32)
Calcium: 9.5 mg/dL (ref 8.9–10.3)
Chloride: 102 mmol/L (ref 98–111)
Creatinine, Ser: 0.65 mg/dL (ref 0.44–1.00)
GFR, Estimated: >60 mL/min (ref >60)
Glucose, Bld: 107 mg/dL — ABNORMAL HIGH (ref 70–99)
Potassium: 3.8 mmol/L (ref 3.5–5.1)
Sodium: 135 mmol/L (ref 135–145)
Total Bilirubin: 0.6 mg/dL (ref 0.0–1.2)
Total Protein: 7 g/dL (ref 6.5–8.1)

## 2024-09-20 LAB — HCG, SERUM, QUALITATIVE: Preg, Serum: NEGATIVE

## 2024-09-20 LAB — LIPASE, BLOOD: Lipase: 64 U/L — ABNORMAL HIGH (ref 11–51)

## 2024-09-20 LAB — ETHANOL: Alcohol, Ethyl (B): <15 mg/dL (ref <15)

## 2024-09-20 MED ORDER — HYDROMORPHONE HCL 1 MG/ML IJ SOLN
1.0000 mg | Freq: Once | INTRAMUSCULAR | Status: AC
  Administered 2024-09-20: 1 mg via INTRAVENOUS
  Filled 2024-09-20: qty 1

## 2024-09-20 MED ORDER — OXYCODONE-ACETAMINOPHEN 5-325 MG PO TABS: 1.0000 | ORAL_TABLET | Freq: Four times a day (QID) | ORAL | 0 refills | Status: AC | PRN

## 2024-09-20 MED ORDER — IOHEXOL 350 MG/ML SOLN
75.0000 mL | Freq: Once | INTRAVENOUS | Status: AC | PRN
  Administered 2024-09-20: 75 mL via INTRAVENOUS

## 2024-09-20 MED ORDER — ONDANSETRON HCL 4 MG/2ML IJ SOLN
4.0000 mg | Freq: Once | INTRAMUSCULAR | Status: AC
  Administered 2024-09-20: 4 mg via INTRAVENOUS
  Filled 2024-09-20: qty 2

## 2024-09-20 MED ORDER — SUCRALFATE 1 GM/10ML PO SUSP
1.0000 g | Freq: Three times a day (TID) | ORAL | Status: DC
Start: 2024-09-20 — End: 2024-09-20
  Administered 2024-09-20: 1 g via ORAL
  Filled 2024-09-20: qty 10

## 2024-09-20 MED ORDER — SODIUM CHLORIDE 0.9 % IV BOLUS
1000.0000 mL | Freq: Once | INTRAVENOUS | Status: AC
  Administered 2024-09-20: 1000 mL via INTRAVENOUS

## 2024-09-20 MED ORDER — FAMOTIDINE IN NACL 20-0.9 MG/50ML-% IV SOLN
20.0000 mg | Freq: Once | INTRAVENOUS | Status: AC
  Administered 2024-09-20: 20 mg via INTRAVENOUS
  Filled 2024-09-20: qty 50

## 2024-09-20 MED ORDER — ONDANSETRON 4 MG PO TBDP: 4.0000 mg | ORAL_TABLET | Freq: Three times a day (TID) | ORAL | 0 refills | Status: AC | PRN

## 2024-09-20 NOTE — Plan of Care (Signed)
 I was asked to speak to the patient as she stated she needed to leave in order to attend to a family matter.  Discussed with patient that she required additional monitoring given her low potassium on admission.  Explained the dangers very low potassium including arrhythmia and death.  Patient still wish to leave AGAINST MEDICAL ADVICE.

## 2024-09-20 NOTE — ED Provider Notes (Signed)
 [place] [facility] AT [facility] Provider Note   CSN: [id number] Arrival date & time: 09/20/24  9088     Patient presents with: Abdominal Pain   Audrey Schultz is a 33 y.o. female.   HPI Adult female arrives 1 day after leaving our affiliate facility following admission for acute pancreatitis.  She notes that she was improving, felt she was ready to go home.  However, over the past hours pain has recurred, now more left sided rather than midline, otherwise similar with severe pain, nausea, vomiting.  She continues to drink alcohol , but less than she was previously, now 2 airplane bottles daily down from pint daily.    Prior to Admission medications   Medication Sig Start Date End Date Taking? Authorizing Provider  oxyCODONE -acetaminophen  (PERCOCET/ROXICET) 5-325 MG tablet Take 1 tablet by mouth every 6 (six) hours as needed for severe pain (pain score 7-10). 09/20/24  Yes Garrick Charleston, MD  cyclobenzaprine  (FLEXERIL ) 5 MG tablet Take 1 tablet (5 mg total) by mouth at bedtime. Patient not taking: Reported on 09/18/2024 06/21/24   Darci Pore, MD  docusate sodium  (COLACE) 100 MG capsule Take 1 capsule (100 mg total) by mouth 2 (two) times daily. Patient not taking: Reported on 06/20/2024 03/10/24   Billy Knee, FNP  folic acid  (FOLVITE ) 1 MG tablet Take 1 tablet (1 mg total) by mouth daily. Patient not taking: Reported on 09/18/2024 06/22/24   Darci Pore, MD  Iron , Ferrous Sulfate , 325 (65 Fe) MG TABS Take 325 mg by mouth daily. Patient not taking: Reported on 08/20/2024 06/21/24   Darci Pore, MD  medroxyPROGESTERone  (DEPO-PROVERA ) 150 MG/ML injection Inject 1 mL (150 mg total) into the muscle every 3 (three) months. Patient not taking: Reported on 09/18/2024 06/13/24   Eveline Lynwood MATSU, MD  Multiple Vitamin (MULTIVITAMIN WITH MINERALS) TABS tablet Take 1 tablet by mouth daily. Patient not taking: Reported on 09/18/2024 06/22/24    Darci Pore, MD  nicotine  (NICODERM CQ  - DOSED IN MG/24 HOURS) 21 mg/24hr patch Place 1 patch (21 mg total) onto the skin daily. Patient not taking: Reported on 09/18/2024 06/22/24   Darci Pore, MD  ondansetron  (ZOFRAN -ODT) 4 MG disintegrating tablet Take 1 tablet (4 mg total) by mouth every 8 (eight) hours as needed for nausea or vomiting. 09/20/24   Garrick Charleston, MD  polyethylene glycol powder (GLYCOLAX /MIRALAX ) 17 GM/SCOOP powder Take 17 g by mouth daily as needed for moderate constipation. Patient not taking: Reported on 09/07/2024 03/10/24   Billy Knee, FNP  thiamine  (VITAMIN B-1) 100 MG tablet Take 1 tablet (100 mg total) by mouth daily. Patient not taking: Reported on 09/18/2024 06/22/24   Darci Pore, MD    Allergies: Shellfish allergy    Review of Systems  Updated Vital Signs BP (!) 152/116 (BP Location: Right Wrist)   Pulse 94   Temp 98.3 F (36.8 C) (Oral)   Resp 20   LMP  (LMP Unknown)   SpO2 100%   Physical Exam Vitals and nursing note reviewed.  Constitutional:      Appearance: She is well-developed. She is diaphoretic.  HENT:     Head: Normocephalic and atraumatic.  Eyes:     Conjunctiva/sclera: Conjunctivae normal.  Cardiovascular:     Rate and Rhythm: Normal rate and regular rhythm.  Pulmonary:     Effort: Pulmonary effort is normal. No respiratory distress.     Breath sounds: No stridor.  Abdominal:     General: There is no distension.  Tenderness: There is generalized abdominal tenderness.  Skin:    General: Skin is warm.  Neurological:     Mental Status: She is alert and oriented to person, place, and time.     Cranial Nerves: No cranial nerve deficit.  Psychiatric:        Mood and Affect: Mood normal.     (all labs ordered are listed, but only abnormal results are displayed) Labs Reviewed  COMPREHENSIVE METABOLIC PANEL WITH GFR - Abnormal; Notable for the following components:      Result Value   Glucose,  Bld 107 (*)    BUN <5 (*)    All other components within normal limits  LIPASE, BLOOD - Abnormal; Notable for the following components:   Lipase 64 (*)    All other components within normal limits  CBC WITH DIFFERENTIAL/PLATELET - Abnormal; Notable for the following components:   RBC 3.41 (*)    Hemoglobin 11.2 (*)    HCT 34.4 (*)    MCV 100.9 (*)    All other components within normal limits  ETHANOL  HCG, SERUM, QUALITATIVE    EKG: None  Radiology: CT ABDOMEN PELVIS W CONTRAST Result Date: 09/20/2024 CLINICAL DATA:  Acute pancreatitis. Left lower quadrant pain that radiates to the back. Nausea. EXAM: CT ABDOMEN AND PELVIS WITH CONTRAST TECHNIQUE: Multidetector CT imaging of the abdomen and pelvis was performed using the standard protocol following bolus administration of intravenous contrast. RADIATION DOSE REDUCTION: This exam was performed according to the departmental dose-optimization program which includes automated exposure control, adjustment of the mA and/or kV according to patient size and/or use of iterative reconstruction technique. CONTRAST:  75mL OMNIPAQUE  IOHEXOL  350 MG/ML SOLN COMPARISON:  06/19/2024. FINDINGS: Lower chest: Bilateral dependent atelectasis. Heart is at the upper limits of normal in size to mildly enlarged. No pericardial or pleural effusion. Distal esophagus is grossly unremarkable. Hepatobiliary: Liver is slightly decreased in attenuation diffusely. Liver and gallbladder are otherwise unremarkable. No biliary ductal dilatation. Pancreas: Peripancreatic haziness and fluid. Gland is uniform in attenuation. There are a few calcifications in the head of the pancreas and uncinate process. Spleen: Negative. Adrenals/Urinary Tract: Adrenal glands and kidneys are unremarkable. Ureters are decompressed. Bladder is grossly unremarkable. Stomach/Bowel: Stomach, small bowel and appendix are unremarkable. Mild pericolonic stranding and haziness adjacent to the ascending  colon. Colon is otherwise unremarkable. Vascular/Lymphatic: Vascular structures are unremarkable. No pathologically enlarged lymph nodes. Reproductive: Uterus is visualized.  No adnexal mass. Other: Moderate pelvic free fluid. Mesenteries and peritoneum are otherwise unremarkable. Musculoskeletal: Postoperative changes in the right femur. IMPRESSION: 1. Acute pancreatitis. Calcifications in the head of the pancreas are indicative of underlying chronic calcific pancreatitis. 2. Mild haziness and stranding adjacent to the ascending colon raise suspicion for ascending colitis. 3. Moderate pelvic free fluid. 4. Liver appears mildly steatotic. Electronically Signed   By: Newell Eke M.D.   On: 09/20/2024 14:26     Procedures   Medications Ordered in the ED  sucralfate (CARAFATE) 1 GM/10ML suspension 1 g (1 g Oral Given 09/20/24 1204)  sodium chloride  0.9 % bolus 1,000 mL (0 mLs Intravenous Stopped 09/20/24 1459)  HYDROmorphone  (DILAUDID ) injection 1 mg (1 mg Intravenous Given 09/20/24 1105)  ondansetron  (ZOFRAN ) injection 4 mg (4 mg Intravenous Given 09/20/24 1105)  famotidine (PEPCID) IVPB 20 mg premix (0 mg Intravenous Stopped 09/20/24 1157)  sodium chloride  0.9 % bolus 1,000 mL (1,000 mLs Intravenous New Bag/Given 09/20/24 1410)  HYDROmorphone  (DILAUDID ) injection 1 mg (1 mg Intravenous Given 09/20/24 1414)  iohexol  (OMNIPAQUE ) 350 MG/ML injection 75 mL (75 mLs Intravenous Contrast Given 09/20/24 1259)  HYDROmorphone  (DILAUDID ) injection 1 mg (1 mg Intravenous Given 09/20/24 1531)                                    Medical Decision Making Adult female with recurrent pancreatitis in the context of ongoing alcohol  use, though diminished presents 1 day after discharge, now with worsening abdominal pain, concern for recurrence versus progression of pancreatitis.  Patient is awake, alert, afebrile, some reassurance for low suspicion of necrosis. Cardiac 105 sinus tach abnormal pulse ox 100% room  air normal  Amount and/or Complexity of Data Reviewed External Data Reviewed: notes.    Details: Hospitalization notes reviewed Labs: ordered. Decision-making details documented in ED Course. Radiology: ordered.  Risk Prescription drug management. Decision regarding hospitalization. Diagnosis or treatment significantly limited by social determinants of health.  With ongoing pain CT scan ordered.  Initial labs notable for lipase 1/10 of most recent value. 3:32 PM Patient markedly better.  Discussed all findings including CT results with pancreatitis.  Lipase has improved markedly, she is hemodynamically unremarkable, is calm, has been sleeping for much of her ED evaluation.  We discussed admission, based on abnormal CT, history, recent admission, patient states that she would like to try going home, with analgesics, outpatient follow-up.  Without evidence for necrosis, with improved lipase, otherwise reassuring labs, this is reasonable, patient will follow-up closely with primary care.     Final diagnoses:  Alcohol -induced acute pancreatitis without infection or necrosis    ED Discharge Orders          Ordered    ondansetron  (ZOFRAN -ODT) 4 MG disintegrating tablet  Every 8 hours PRN        09/20/24 1531    oxyCODONE -acetaminophen  (PERCOCET/ROXICET) 5-325 MG tablet  Every 6 hours PRN        09/20/24 1531               Garrick Charleston, MD 09/20/24 [phone]

## 2024-09-20 NOTE — ED Triage Notes (Signed)
 Pt BIB EMS for LLQ pain that radiates to back, seen at [facility] 2 days ago and dx with pancreatitis but signed out AMA d/t family emergency. Pt 9/10 pain, nausea but no vomiting. Says she was given K via IV at [place]. Aox4.

## 2024-09-20 NOTE — Discharge Instructions (Signed)
 Please use a clear liquid diet for the next 72 hours.  You may then slowly return to eating normal food.  Be sure to follow-up with your physician.  Return here for concerning changes in your condition.
